# Patient Record
Sex: Female | Born: 1935 | Race: White | Hispanic: No | Marital: Married | Smoking: Never smoker
Health system: Southern US, Community
[De-identification: ages and names within clinical notes are randomized; demographics above are authoritative.]

## PROBLEM LIST (undated history)

## (undated) DIAGNOSIS — C8590 Non-Hodgkin lymphoma, unspecified, unspecified site: Secondary | ICD-10-CM

## (undated) DIAGNOSIS — I10 Essential (primary) hypertension: Secondary | ICD-10-CM

## (undated) DIAGNOSIS — E079 Disorder of thyroid, unspecified: Secondary | ICD-10-CM

## (undated) DIAGNOSIS — I509 Heart failure, unspecified: Secondary | ICD-10-CM

## (undated) DIAGNOSIS — C649 Malignant neoplasm of unspecified kidney, except renal pelvis: Secondary | ICD-10-CM

## (undated) DIAGNOSIS — N289 Disorder of kidney and ureter, unspecified: Secondary | ICD-10-CM

## (undated) DIAGNOSIS — M8580 Other specified disorders of bone density and structure, unspecified site: Secondary | ICD-10-CM

## (undated) HISTORY — PX: OTHER SURGICAL HISTORY: SHX169

## (undated) HISTORY — PX: ABDOMINAL HYSTERECTOMY: SHX81

## (undated) HISTORY — DX: Essential (primary) hypertension: I10

## (undated) HISTORY — DX: Disorder of thyroid, unspecified: E07.9

## (undated) HISTORY — PX: TONSILLECTOMY: SUR1361

## (undated) HISTORY — DX: Other specified disorders of bone density and structure, unspecified site: M85.80

---

## 1999-10-15 ENCOUNTER — Encounter: Admission: RE | Admit: 1999-10-15 | Discharge: 1999-10-15 | Payer: Self-pay | Admitting: *Deleted

## 1999-10-15 ENCOUNTER — Encounter: Payer: Self-pay | Admitting: Obstetrics and Gynecology

## 2000-04-18 ENCOUNTER — Inpatient Hospital Stay (HOSPITAL_COMMUNITY): Admission: EM | Admit: 2000-04-18 | Discharge: 2000-04-19 | Payer: Self-pay | Admitting: Emergency Medicine

## 2000-04-18 ENCOUNTER — Encounter (INDEPENDENT_AMBULATORY_CARE_PROVIDER_SITE_OTHER): Payer: Self-pay

## 2000-10-17 ENCOUNTER — Encounter: Payer: Self-pay | Admitting: Obstetrics and Gynecology

## 2000-10-17 ENCOUNTER — Encounter: Admission: RE | Admit: 2000-10-17 | Discharge: 2000-10-17 | Payer: Self-pay | Admitting: Obstetrics and Gynecology

## 2000-12-20 ENCOUNTER — Ambulatory Visit (HOSPITAL_COMMUNITY): Admission: RE | Admit: 2000-12-20 | Discharge: 2000-12-20 | Payer: Self-pay | Admitting: Oral Surgery

## 2001-01-12 ENCOUNTER — Encounter: Payer: Self-pay | Admitting: Family Medicine

## 2001-01-12 ENCOUNTER — Encounter: Admission: RE | Admit: 2001-01-12 | Discharge: 2001-01-12 | Payer: Self-pay | Admitting: Family Medicine

## 2001-04-24 ENCOUNTER — Ambulatory Visit (HOSPITAL_COMMUNITY): Admission: RE | Admit: 2001-04-24 | Discharge: 2001-04-24 | Payer: Self-pay | Admitting: Surgery

## 2001-04-24 ENCOUNTER — Encounter (INDEPENDENT_AMBULATORY_CARE_PROVIDER_SITE_OTHER): Payer: Self-pay | Admitting: Specialist

## 2001-10-26 ENCOUNTER — Encounter: Payer: Self-pay | Admitting: Family Medicine

## 2001-10-26 ENCOUNTER — Encounter: Admission: RE | Admit: 2001-10-26 | Discharge: 2001-10-26 | Payer: Self-pay | Admitting: Family Medicine

## 2002-10-30 ENCOUNTER — Encounter: Admission: RE | Admit: 2002-10-30 | Discharge: 2002-10-30 | Payer: Self-pay | Admitting: Family Medicine

## 2002-10-30 ENCOUNTER — Encounter: Payer: Self-pay | Admitting: Family Medicine

## 2003-11-18 ENCOUNTER — Encounter: Admission: RE | Admit: 2003-11-18 | Discharge: 2003-11-18 | Payer: Self-pay | Admitting: Family Medicine

## 2004-11-27 ENCOUNTER — Encounter: Admission: RE | Admit: 2004-11-27 | Discharge: 2004-11-27 | Payer: Self-pay | Admitting: Family Medicine

## 2005-02-19 ENCOUNTER — Ambulatory Visit: Payer: Self-pay | Admitting: Family Medicine

## 2005-03-16 ENCOUNTER — Encounter: Admission: RE | Admit: 2005-03-16 | Discharge: 2005-03-16 | Payer: Self-pay | Admitting: Family Medicine

## 2005-03-29 ENCOUNTER — Ambulatory Visit: Payer: Self-pay | Admitting: Family Medicine

## 2005-11-29 ENCOUNTER — Encounter: Admission: RE | Admit: 2005-11-29 | Discharge: 2005-11-29 | Payer: Self-pay | Admitting: Family Medicine

## 2006-03-07 ENCOUNTER — Ambulatory Visit: Payer: Self-pay | Admitting: Family Medicine

## 2006-03-21 ENCOUNTER — Ambulatory Visit: Payer: Self-pay | Admitting: Family Medicine

## 2006-03-22 ENCOUNTER — Ambulatory Visit: Payer: Self-pay | Admitting: Family Medicine

## 2006-04-25 ENCOUNTER — Ambulatory Visit: Payer: Self-pay | Admitting: Family Medicine

## 2006-05-16 ENCOUNTER — Ambulatory Visit: Payer: Self-pay | Admitting: Family Medicine

## 2006-09-19 ENCOUNTER — Ambulatory Visit: Payer: Self-pay | Admitting: Family Medicine

## 2006-10-13 ENCOUNTER — Ambulatory Visit: Payer: Self-pay | Admitting: Family Medicine

## 2006-10-13 LAB — CONVERTED CEMR LAB
Basophils Absolute: 0.2 10*3/uL — ABNORMAL HIGH (ref 0.0–0.1)
Basophils Relative: 4.3 % — ABNORMAL HIGH (ref 0.0–1.0)
Hemoglobin: 13.2 g/dL (ref 12.0–15.0)
Lymphocytes Relative: 34.9 % (ref 12.0–46.0)
MCHC: 33.9 g/dL (ref 30.0–36.0)
Monocytes Absolute: 0.6 10*3/uL (ref 0.2–0.7)
Monocytes Relative: 12 % — ABNORMAL HIGH (ref 3.0–11.0)
Neutro Abs: 2 10*3/uL (ref 1.4–7.7)
Platelets: 180 10*3/uL (ref 150–400)
RBC: 4.21 M/uL (ref 3.87–5.11)
Saturation Ratios: 30.6 % (ref 20.0–50.0)
WBC: 4.6 10*3/uL (ref 4.5–10.5)

## 2006-12-12 ENCOUNTER — Encounter: Admission: RE | Admit: 2006-12-12 | Discharge: 2006-12-12 | Payer: Self-pay | Admitting: Family Medicine

## 2006-12-26 DIAGNOSIS — M949 Disorder of cartilage, unspecified: Secondary | ICD-10-CM

## 2006-12-26 DIAGNOSIS — E039 Hypothyroidism, unspecified: Secondary | ICD-10-CM

## 2006-12-26 DIAGNOSIS — C8589 Other specified types of non-Hodgkin lymphoma, extranodal and solid organ sites: Secondary | ICD-10-CM

## 2006-12-26 DIAGNOSIS — M899 Disorder of bone, unspecified: Secondary | ICD-10-CM | POA: Insufficient documentation

## 2007-04-11 ENCOUNTER — Ambulatory Visit: Payer: Self-pay | Admitting: Family Medicine

## 2007-04-11 LAB — CONVERTED CEMR LAB
AST: 25 units/L (ref 0–37)
Basophils Absolute: 0 10*3/uL (ref 0.0–0.1)
Basophils Relative: 1.2 % — ABNORMAL HIGH (ref 0.0–1.0)
Bilirubin, Direct: 0.1 mg/dL (ref 0.0–0.3)
Calcium: 9.5 mg/dL (ref 8.4–10.5)
HCT: 34.7 % — ABNORMAL LOW (ref 36.0–46.0)
HDL: 67.5 mg/dL (ref >39.0)
Hemoglobin: 11.9 g/dL — ABNORMAL LOW (ref 12.0–15.0)
Lymphocytes Relative: 32.5 % (ref 12.0–46.0)
MCHC: 34.3 g/dL (ref 30.0–36.0)
MCV: 83.9 fL (ref 78.0–100.0)
Neutro Abs: 2 10*3/uL (ref 1.4–7.7)
Neutrophils Relative %: 47.6 % (ref 43.0–77.0)
RDW: 13.7 % (ref 11.5–14.6)
Total Bilirubin: 0.7 mg/dL (ref 0.3–1.2)
Total Protein: 7 g/dL (ref 6.0–8.3)
WBC: 4 10*3/uL — ABNORMAL LOW (ref 4.5–10.5)

## 2007-04-20 ENCOUNTER — Ambulatory Visit: Payer: Self-pay | Admitting: Family Medicine

## 2007-12-14 ENCOUNTER — Encounter: Admission: RE | Admit: 2007-12-14 | Discharge: 2007-12-14 | Payer: Self-pay | Admitting: Family Medicine

## 2007-12-18 ENCOUNTER — Encounter (INDEPENDENT_AMBULATORY_CARE_PROVIDER_SITE_OTHER): Payer: Self-pay | Admitting: *Deleted

## 2008-05-15 ENCOUNTER — Ambulatory Visit: Payer: Self-pay | Admitting: Internal Medicine

## 2008-05-15 ENCOUNTER — Encounter: Payer: Self-pay | Admitting: Family Medicine

## 2008-06-07 ENCOUNTER — Telehealth (INDEPENDENT_AMBULATORY_CARE_PROVIDER_SITE_OTHER): Payer: Self-pay | Admitting: *Deleted

## 2008-06-10 ENCOUNTER — Telehealth (INDEPENDENT_AMBULATORY_CARE_PROVIDER_SITE_OTHER): Payer: Self-pay | Admitting: *Deleted

## 2008-06-13 ENCOUNTER — Telehealth (INDEPENDENT_AMBULATORY_CARE_PROVIDER_SITE_OTHER): Payer: Self-pay | Admitting: *Deleted

## 2008-07-15 ENCOUNTER — Telehealth (INDEPENDENT_AMBULATORY_CARE_PROVIDER_SITE_OTHER): Payer: Self-pay | Admitting: *Deleted

## 2008-08-16 ENCOUNTER — Telehealth (INDEPENDENT_AMBULATORY_CARE_PROVIDER_SITE_OTHER): Payer: Self-pay | Admitting: *Deleted

## 2008-08-19 ENCOUNTER — Encounter: Payer: Self-pay | Admitting: Family Medicine

## 2008-08-19 ENCOUNTER — Ambulatory Visit: Payer: Self-pay | Admitting: Family Medicine

## 2008-08-19 DIAGNOSIS — I10 Essential (primary) hypertension: Secondary | ICD-10-CM

## 2008-08-19 LAB — CONVERTED CEMR LAB
Bilirubin Urine: NEGATIVE
Blood in Urine, dipstick: NEGATIVE
Glucose, Urine, Semiquant: NEGATIVE
Ketones, urine, test strip: NEGATIVE
Nitrite: NEGATIVE
Protein, U semiquant: NEGATIVE
Specific Gravity, Urine: 1.02
Urobilinogen, UA: NEGATIVE
WBC Urine, dipstick: NEGATIVE
pH: 5

## 2008-08-20 ENCOUNTER — Telehealth (INDEPENDENT_AMBULATORY_CARE_PROVIDER_SITE_OTHER): Payer: Self-pay | Admitting: *Deleted

## 2008-08-20 LAB — CONVERTED CEMR LAB
ALT: 16 units/L (ref 0–35)
Alkaline Phosphatase: 51 units/L (ref 39–117)
BUN: 13 mg/dL (ref 6–23)
Basophils Absolute: 0 10*3/uL (ref 0.0–0.1)
Bilirubin, Direct: 0.1 mg/dL (ref 0.0–0.3)
Calcium: 9.1 mg/dL (ref 8.4–10.5)
Chloride: 104 meq/L (ref 96–112)
Cholesterol: 193 mg/dL (ref 0–200)
Eosinophils Absolute: 0.1 10*3/uL (ref 0.0–0.7)
GFR calc non Af Amer: 65 mL/min
Glucose, Bld: 90 mg/dL (ref 70–99)
HDL: 63.9 mg/dL (ref >39.0)
MCHC: 34.4 g/dL (ref 30.0–36.0)
Monocytes Absolute: 0.5 10*3/uL (ref 0.1–1.0)
Monocytes Relative: 10.4 % (ref 3.0–12.0)
Neutrophils Relative %: 54.9 % (ref 43.0–77.0)
Potassium: 4.1 meq/L (ref 3.5–5.1)
Sodium: 142 meq/L (ref 135–145)
Total CHOL/HDL Ratio: 3
Triglycerides: 106 mg/dL (ref 0–149)
VLDL: 21 mg/dL (ref 0–40)
Vit D, 1,25-Dihydroxy: 55 (ref 30–89)

## 2008-08-29 ENCOUNTER — Ambulatory Visit: Payer: Self-pay | Admitting: Family Medicine

## 2008-08-29 ENCOUNTER — Encounter (INDEPENDENT_AMBULATORY_CARE_PROVIDER_SITE_OTHER): Payer: Self-pay | Admitting: *Deleted

## 2008-08-29 LAB — CONVERTED CEMR LAB
OCCULT 2: NEGATIVE
OCCULT 3: NEGATIVE

## 2008-09-18 ENCOUNTER — Ambulatory Visit: Payer: Self-pay | Admitting: Family Medicine

## 2009-01-07 ENCOUNTER — Encounter: Admission: RE | Admit: 2009-01-07 | Discharge: 2009-01-07 | Payer: Self-pay | Admitting: Family Medicine

## 2009-01-27 ENCOUNTER — Encounter (INDEPENDENT_AMBULATORY_CARE_PROVIDER_SITE_OTHER): Payer: Self-pay | Admitting: *Deleted

## 2009-02-11 ENCOUNTER — Encounter: Payer: Self-pay | Admitting: Family Medicine

## 2009-03-19 ENCOUNTER — Ambulatory Visit: Payer: Self-pay | Admitting: Family Medicine

## 2009-05-14 ENCOUNTER — Telehealth (INDEPENDENT_AMBULATORY_CARE_PROVIDER_SITE_OTHER): Payer: Self-pay | Admitting: *Deleted

## 2009-08-21 ENCOUNTER — Ambulatory Visit: Payer: Self-pay | Admitting: Family Medicine

## 2009-08-26 ENCOUNTER — Encounter (INDEPENDENT_AMBULATORY_CARE_PROVIDER_SITE_OTHER): Payer: Self-pay | Admitting: *Deleted

## 2009-08-26 LAB — CONVERTED CEMR LAB
AST: 23 units/L (ref 0–37)
Albumin: 3.6 g/dL (ref 3.5–5.2)
Basophils Relative: 1.4 % (ref 0.0–3.0)
Bilirubin, Direct: 0 mg/dL (ref 0.0–0.3)
CO2: 31 meq/L (ref 19–32)
Calcium: 9.4 mg/dL (ref 8.4–10.5)
Creatinine, Ser: 0.9 mg/dL (ref 0.4–1.2)
Eosinophils Absolute: 0.2 10*3/uL (ref 0.0–0.7)
GFR calc non Af Amer: 64.88 mL/min (ref >60)
Glucose, Bld: 80 mg/dL (ref 70–99)
HCT: 37.1 % (ref 36.0–46.0)
Hemoglobin: 12.4 g/dL (ref 12.0–15.0)
LDL Cholesterol: 117 mg/dL — ABNORMAL HIGH (ref 0–99)
MCV: 95.6 fL (ref 78.0–100.0)
Monocytes Absolute: 0.6 10*3/uL (ref 0.1–1.0)
Monocytes Relative: 13.1 % — ABNORMAL HIGH (ref 3.0–12.0)
RBC: 3.88 M/uL (ref 3.87–5.11)
Sodium: 137 meq/L (ref 135–145)
Total Bilirubin: 0.6 mg/dL (ref 0.3–1.2)
Total CHOL/HDL Ratio: 3
VLDL: 16.8 mg/dL (ref 0.0–40.0)

## 2009-08-27 ENCOUNTER — Ambulatory Visit: Payer: Self-pay | Admitting: Family Medicine

## 2009-10-16 ENCOUNTER — Encounter: Payer: Self-pay | Admitting: Family Medicine

## 2010-01-12 ENCOUNTER — Encounter: Admission: RE | Admit: 2010-01-12 | Discharge: 2010-01-12 | Payer: Self-pay | Admitting: Family Medicine

## 2010-01-14 ENCOUNTER — Encounter: Payer: Self-pay | Admitting: Family Medicine

## 2010-01-15 ENCOUNTER — Encounter: Admission: RE | Admit: 2010-01-15 | Discharge: 2010-01-15 | Payer: Self-pay | Admitting: Family Medicine

## 2010-07-13 ENCOUNTER — Telehealth (INDEPENDENT_AMBULATORY_CARE_PROVIDER_SITE_OTHER): Payer: Self-pay | Admitting: *Deleted

## 2010-08-24 ENCOUNTER — Ambulatory Visit: Payer: Self-pay | Admitting: Family Medicine

## 2010-08-24 ENCOUNTER — Ambulatory Visit: Payer: Self-pay | Admitting: Internal Medicine

## 2010-08-24 ENCOUNTER — Encounter: Payer: Self-pay | Admitting: Family Medicine

## 2010-08-24 LAB — CONVERTED CEMR LAB
Bilirubin Urine: NEGATIVE
Blood in Urine, dipstick: NEGATIVE
Glucose, Urine, Semiquant: NEGATIVE
WBC Urine, dipstick: NEGATIVE

## 2010-08-26 LAB — CONVERTED CEMR LAB
Alkaline Phosphatase: 52 units/L (ref 39–117)
Bilirubin, Direct: 0.2 mg/dL (ref 0.0–0.3)
Calcium: 9.7 mg/dL (ref 8.4–10.5)
Cholesterol: 207 mg/dL — ABNORMAL HIGH (ref 0–200)
Creatinine, Ser: 0.9 mg/dL (ref 0.4–1.2)
Direct LDL: 115.8 mg/dL
Eosinophils Relative: 2.9 % (ref 0.0–5.0)
Glucose, Bld: 87 mg/dL (ref 70–99)
HCT: 34.3 % — ABNORMAL LOW (ref 36.0–46.0)
HDL: 59.5 mg/dL (ref >39.00)
Hemoglobin: 11.6 g/dL — ABNORMAL LOW (ref 12.0–15.0)
Lymphs Abs: 1.4 10*3/uL (ref 0.7–4.0)
MCHC: 33.9 g/dL (ref 30.0–36.0)
MCV: 96.5 fL (ref 78.0–100.0)
Platelets: 177 10*3/uL (ref 150.0–400.0)
RBC: 3.55 M/uL — ABNORMAL LOW (ref 3.87–5.11)
Sodium: 141 meq/L (ref 135–145)
Total Bilirubin: 0.4 mg/dL (ref 0.3–1.2)
Total CHOL/HDL Ratio: 3
Triglycerides: 142 mg/dL (ref 0.0–149.0)

## 2010-09-01 ENCOUNTER — Encounter: Payer: Self-pay | Admitting: Family Medicine

## 2010-09-02 ENCOUNTER — Ambulatory Visit: Payer: Self-pay | Admitting: Family Medicine

## 2010-09-02 LAB — CONVERTED CEMR LAB: Fecal Occult Bld: NEGATIVE

## 2010-09-15 ENCOUNTER — Telehealth (INDEPENDENT_AMBULATORY_CARE_PROVIDER_SITE_OTHER): Payer: Self-pay | Admitting: *Deleted

## 2010-12-06 ENCOUNTER — Encounter: Payer: Self-pay | Admitting: Family Medicine

## 2010-12-07 ENCOUNTER — Other Ambulatory Visit: Payer: Self-pay | Admitting: Family Medicine

## 2010-12-07 DIAGNOSIS — Z1239 Encounter for other screening for malignant neoplasm of breast: Secondary | ICD-10-CM

## 2010-12-15 NOTE — Medication Information (Signed)
Summary: Letter Regarding Premarin/[facility]  Letter Regarding Premarin/[facility]   Imported By: Lanelle Bal 11/27/2009 08:34:28  _____________________________________________________________________  External Attachment:    Type:   Image     Comment:   External Document  Appended Document: Letter Regarding Premarin/[facility] Let pt know---Ins co sent letter advising she come off of premarin.  Has she tried to come off recently?  If symptoms are too bad we will con't ----but risk of CV problems increased with more than  5 years of use.    Appended Document: Letter Regarding Premarin/[facility] Pt states that Dr.McPhail (who originally started her on the medication) told her she should always continue. She says she feels the same but does not feel comfortable coming off the medicaiton.   Appended Document: Letter Regarding Premarin/[facility] We all used to feel the same, but the thinking has changed in the last few years.   She is on a low dose.  As long as she understands the risk --- she can con't .    Appended Document: Letter Regarding Premarin/[facility] Pt is going to try to come off- does she need to taper off? She is going to finish the medication that she has now. Then try.   Appended Document: Letter Regarding Premarin/[facility] she is on the lowest dose---she can just stop.    Appended Document: Letter Regarding Premarin/[facility] pt is aware.

## 2010-12-15 NOTE — Miscellaneous (Signed)
Summary: BONE DENSITY  Clinical Lists Changes  Orders: Added new Test order of T-Bone Densitometry (77080) - Signed Added new Test order of T-Lumbar Vertebral Assessment (77082) - Signed 

## 2010-12-15 NOTE — Assessment & Plan Note (Signed)
Summary: CPX/FASTING//KN   Vital Signs:  Patient profile:   77 year old female Menstrual status:  hysterectomy Height:      62.5 inches Weight:      153.2 pounds BMI:     27.67 Temp:     98.5 degrees F oral Pulse rate:   72 / minute Pulse rhythm:   regular BP sitting:   116 / 72  (right arm) Cuff size:   regular  Vitals Entered By: Almeta Monas CMA Duncan Dull) (August 24, 2010 8:51 AM) CC: cpx/fasting, flu shot given  Does patient need assistance? Functional Status Self care, Cook/clean, Shopping, Social activities Ambulation Normal Comments pt able to do all adls and can read and write.  Vision Screening:      Vision Comments: optho--- q1y corrected with glasses  40db HL: Left  Right  Audiometry Comment: + L hearing aid      Menstrual Status hysterectomy   History of Present Illness: Pt here for cpe.  No complaints.    Preventive Screening-Counseling & Management  Alcohol-Tobacco     Alcohol drinks/day: 0     Smoking Status: never  Caffeine-Diet-Exercise     Caffeine use/day: <1     Diet Comments: needs to work harder on salt restriction     Does Patient Exercise: yes     Type of exercise: walking     Exercise (avg: min/session): 30-60     Times/week: 5  Hep-HIV-STD-Contraception     Hepatitis Risk: no risk noted     HIV Risk: no risk noted     STD Risk: no risk noted     Dental Visit-last 6 months yes     Dental Care Counseling: not indicated; dental care within six months     SBE monthly: yes     SBE Education/Counseling: not indicated; SBE done regularly  Safety-Violence-Falls     Seat Belt Use: yes     Firearms in the Home: firearms in the home     Firearm Counseling: not indicated; uses recommended firearm safety measures     Smoke Detectors: yes     Violence in the Home: no risk noted     Violence Counseling: not indicated; no violence risk noted     Sexual Abuse: no     Sexual Abuse Counseling: no     Fall Risk: no      Sexual History:   currently monogamous.    Current Medications (verified): 1)  Fosamax 70 Mg  Tabs (Alendronate Sodium) .Marland Kitchen.. 1 By Mouth Weekly 2)  Synthroid 25 Mcg Tabs (Levothyroxine Sodium) .Marland Kitchen.. 1 By Mouth Once Daily 3)  Premarin 0.3 Mg Tabs (Estrogens Conjugated) .Marland Kitchen.. 1 By Mouth Once Daily 4)  Lisinopril-Hydrochlorothiazide 10-12.5 Mg Tabs (Lisinopril-Hydrochlorothiazide) .Marland Kitchen.. 1 By Mouth Once Daily 5)  Vitamin B-12 1000 Mcg Tabs (Cyanocobalamin) .Marland Kitchen.. 1 By Mouth Daily. 6)  Vitamin D3 1000 Unit Tabs (Cholecalciferol) .Marland Kitchen.. 1 By Mouth Daily. 7)  Womens Multivitamin Plus  Tabs (Multiple Vitamins-Minerals) .Marland Kitchen.. 1 By Mouth Daily. 8)  Flaxseed Oil 1000 Mg Caps (Flaxseed (Linseed)) .Marland Kitchen.. 1 By Mouth Daily. 9)  Zostavax 16109 Unt/0.73ml Solr (Zoster Vaccine Live) .Marland Kitchen.. 1 Ml Im X1 10)  Calcium-D 600-200 Mg-Unit Caps (Calcium Carbonate-Vitamin D) .... 2 By Mouth Once Daily 11)  Iron Supplement 325 (65 Fe) Mg Tabs (Ferrous Sulfate) .Marland Kitchen.. 1 By Mouth Once Daily 12)  Zostavax 60454 Unt/0.18ml Solr (Zoster Vaccine Live) .Marland Kitchen.. 1 Ml Im X1  Allergies (verified): No Known Drug Allergies  Past History:  Past Medical History: Last updated: 08/19/2008 Hypothyroidism Osteopenia Hypertension  Past Surgical History: Last updated: 12/26/2006 Hysterectomy (11/99) Tonsillectomy (11/99)  Family History: Last updated: 08/19/2008 dementia  COPD fractured hip  Social History: Last updated: 08/19/2008 Retired Married Former Smoker Alcohol use-no Drug use-no Regular exercise-yes  Risk Factors: Alcohol Use: 0 (08/24/2010) Caffeine Use: <1 (08/24/2010) Diet: needs to work harder on salt restriction (08/24/2010) Exercise: yes (08/24/2010)  Risk Factors: Smoking Status: never (08/24/2010)  Family History: Reviewed history from 08/19/2008 and no changes required. dementia  COPD fractured hip  Social History: Reviewed history from 08/19/2008 and no changes required. 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 Eyes:  Denies blurring, discharge, double vision, eye irritation, eye pain, halos, itching, light sensitivity, red eye, vision loss-1 eye, and vision loss-both eyes. ENT:  Complains of decreased hearing; denies difficulty swallowing, ear discharge, earache, hoarseness, nasal congestion, nosebleeds, postnasal drainage, ringing in ears, sinus pressure, and sore throat. CV:  Denies bluish discoloration of lips or nails, chest pain or discomfort, difficulty breathing at night, difficulty breathing while lying down, fainting, fatigue, leg cramps with exertion, lightheadness, near fainting, palpitations, shortness of breath with exertion, swelling of feet, swelling of hands, and weight gain. Resp:  Denies chest discomfort, chest pain with inspiration, cough, coughing up blood, excessive snoring, hypersomnolence, morning headaches, pleuritic, shortness of breath, sputum productive, and wheezing. GI:  Denies abdominal pain, bloody stools, change in bowel habits, constipation, dark tarry stools, diarrhea, excessive appetite, gas, hemorrhoids, indigestion, loss of appetite, nausea, vomiting, vomiting blood, and yellowish skin color. GU:  Denies abnormal vaginal bleeding, decreased libido, discharge, dysuria, genital sores, hematuria, incontinence, nocturia, urinary frequency, and urinary hesitancy. MS:  Denies joint pain, joint redness, joint swelling, loss of strength, low back pain, mid back pain, muscle aches, muscle , cramps, muscle weakness, stiffness, and thoracic pain. Derm:  Denies changes in color of skin, changes in nail beds, dryness, excessive perspiration, flushing, hair loss, insect bite(s), itching, lesion(s), poor wound healing, and  rash. Neuro:  Denies brief paralysis, difficulty with concentration, disturbances in coordination, falling down, headaches, inability to speak, memory loss, numbness, poor balance, seizures, sensation of room spinning, tingling, tremors, visual disturbances, and weakness. Psych:  Denies alternate hallucination ( auditory/visual), anxiety, depression, easily angered, easily tearful, irritability, mental problems, panic attacks, sense of great danger, suicidal thoughts/plans, thoughts of violence, unusual visions or sounds, and thoughts /plans of harming others. Endo:  Denies cold intolerance, excessive hunger, excessive thirst, excessive urination, heat intolerance, polyuria, and weight change. Heme:  Denies abnormal bruising, bleeding, enlarge lymph nodes, fevers, pallor, and skin discoloration. Allergy:  Denies hives or rash, itching eyes, persistent infections, seasonal allergies, and sneezing.  Physical Exam  General:  Well-developed,well-nourished,in no acute distress; alert,appropriate and cooperative throughout examination Head:  Normocephalic and atraumatic without obvious abnormalities. No apparent alopecia or balding. Eyes:  pupils equal, pupils round, pupils reactive to light, and no injection.   Ears:  External ear exam shows no significant lesions or deformities.  Otoscopic examination reveals clear canals, tympanic membranes are intact bilaterally without bulging, retraction, inflammation or discharge. Hearing is grossly normal bilaterally. Nose:  External nasal examination shows no deformity or inflammation. Nasal mucosa are pink and moist without lesions or exudates. Mouth:  Oral mucosa and oropharynx without lesions or exudates.  Teeth in good repair. Neck:  No deformities, masses, or tenderness noted. Chest Wall:  No deformities, masses, or tenderness noted. Breasts:  No mass, nodules, thickening, tenderness, bulging, retraction, inflamation, nipple discharge or skin changes noted.    Lungs:  Normal respiratory effort, chest expands symmetrically. Lungs are clear to auscultation, no crackles or wheezes. Heart:  normal rate and no murmur.   Abdomen:  Bowel sounds positive,abdomen soft and non-tender without masses, organomegaly or hernias noted. Msk:  normal ROM, no joint tenderness, no joint swelling, and no joint warmth.   Pulses:  R and L carotid,radial,femoral,dorsalis pedis and posterior tibial pulses are full and equal bilaterally Extremities:  No clubbing, cyanosis, edema, or deformity noted with normal full range of motion of all joints.   Neurologic:  No cranial nerve deficits noted. Station and gait are normal. Plantar reflexes are down-going bilaterally. DTRs are symmetrical throughout. Sensory, motor and coordinative functions appear intact. Skin:  Intact without suspicious lesions or rashes Cervical Nodes:  No lymphadenopathy noted Axillary Nodes:  No palpable lymphadenopathy Psych:  Cognition and judgment appear intact. Alert and cooperative with normal attention span and concentration. No apparent delusions, illusions, hallucinations   Impression & Recommendations:  Problem # 1:  PREVENTIVE HEALTH CARE (ICD-V70.0)  Orders: Venipuncture (24401) TLB-Lipid Panel (80061-LIPID) TLB-BMP (Basic Metabolic Panel-BMET) (80048-METABOL) TLB-CBC Platelet - w/Differential (85025-CBCD) TLB-Hepatic/Liver Function Pnl (80076-HEPATIC) TLB-TSH (Thyroid Stimulating Hormone) (84443-TSH) T-Vitamin D (25-Hydroxy) (02725-36644) UA Dipstick w/o Micro (manual) (03474) Welcome to Medicare, Physical (Q5956) EKG w/ Interpretation (93000)  Problem # 2:  HYPERTENSION (ICD-401.9)  Her updated medication list for this problem includes:    Lisinopril-hydrochlorothiazide 10-12.5 Mg Tabs (Lisinopril-hydrochlorothiazide) .Marland Kitchen... 1 by mouth once daily  Orders: Venipuncture (38756) TLB-Lipid Panel (80061-LIPID) TLB-BMP (Basic Metabolic Panel-BMET) (80048-METABOL) TLB-CBC Platelet -  w/Differential (85025-CBCD) TLB-Hepatic/Liver Function Pnl (80076-HEPATIC) TLB-TSH (Thyroid Stimulating Hormone) (84443-TSH) T-Vitamin D (25-Hydroxy) (43329-51884)  Problem # 3:  LYMPHOMA (ICD-202.80)  Problem # 4:  OSTEOPENIA (ICD-733.90)  Her updated medication list for this problem includes:    Fosamax 70 Mg Tabs (Alendronate sodium) .Marland Kitchen... 1 by mouth weekly    Vitamin D3 1000 Unit Tabs (Cholecalciferol) .Marland Kitchen... 1 by mouth daily.    Calcium-d 600-200 Mg-unit Caps (Calcium carbonate-vitamin d) .Marland Kitchen... 2 by mouth once daily  Orders: Venipuncture (16606) TLB-Lipid Panel (80061-LIPID) TLB-BMP (Basic Metabolic Panel-BMET) (80048-METABOL) TLB-CBC Platelet - w/Differential (85025-CBCD) TLB-Hepatic/Liver Function Pnl (80076-HEPATIC) TLB-TSH (Thyroid Stimulating Hormone) (84443-TSH) T-Vitamin D (25-Hydroxy) (30160-10932)  Bone Density: abnormal (06/10/2008) Vit D:55 (08/19/2008)  Problem # 5:  HYPOTHYROIDISM (ICD-244.9)  Her updated medication list for this problem includes:    Synthroid 25 Mcg Tabs (Levothyroxine sodium) .Marland Kitchen... 1 by mouth once daily  Orders: Venipuncture (35573) TLB-Lipid Panel (80061-LIPID) TLB-BMP (Basic Metabolic Panel-BMET) (80048-METABOL) TLB-CBC Platelet - w/Differential (85025-CBCD) TLB-Hepatic/Liver Function Pnl (80076-HEPATIC) TLB-TSH (Thyroid Stimulating Hormone) (84443-TSH) T-Vitamin D (25-Hydroxy) (22025-42706)  Labs Reviewed: TSH: 2.30 (08/21/2009)    Chol: 195 (08/21/2009)   HDL: 61.10 (08/21/2009)   LDL: 117 (08/21/2009)   TG: 84.0 (08/21/2009)  Complete Medication List: 1)  Fosamax 70 Mg Tabs (Alendronate sodium) .Marland Kitchen.. 1 by mouth weekly 2)  Synthroid 25 Mcg Tabs (Levothyroxine sodium) .Marland Kitchen.. 1 by mouth once daily 3)  Premarin 0.3 Mg Tabs (Estrogens conjugated) .Marland Kitchen.. 1 by mouth once daily 4)  Lisinopril-hydrochlorothiazide 10-12.5 Mg Tabs (Lisinopril-hydrochlorothiazide) .Marland Kitchen.. 1 by mouth once daily 5)  Vitamin B-12 1000 Mcg Tabs (Cyanocobalamin) .Marland Kitchen.. 1  by mouth daily. 6)  Vitamin D3 1000  Unit Tabs (Cholecalciferol) .Marland Kitchen.. 1 by mouth daily. 7)  Womens Multivitamin Plus Tabs (Multiple vitamins-minerals) .Marland Kitchen.. 1 by mouth daily. 8)  Flaxseed Oil 1000 Mg Caps (Flaxseed (linseed)) .Marland Kitchen.. 1 by mouth daily. 9)  Zostavax 16109 Unt/0.64ml Solr (Zoster vaccine live) .Marland Kitchen.. 1 ml im x1 10)  Calcium-d 600-200 Mg-unit Caps (Calcium carbonate-vitamin d) .... 2 by mouth once daily 11)  Iron Supplement 325 (65 Fe) Mg Tabs (Ferrous sulfate) .Marland Kitchen.. 1 by mouth once daily 12)  Zostavax 60454 Unt/0.52ml Solr (Zoster vaccine live) .Marland Kitchen.. 1 ml im x1  Other Orders: Flu Vaccine 72yrs + MEDICARE PATIENTS (U9811) Administration Flu vaccine - MCR (B1478) Flu Vaccine Consent Questions     Do you have a history of severe allergic reactions to this vaccine? no    Any prior history of allergic reactions to egg and/or gelatin? no    Do you have a sensitivity to the preservative Thimersol? no    Do you have a past history of Guillan-Barre Syndrome? no    Do you currently have an acute febrile illness? no    Have you ever had a severe reaction to latex? no    Vaccine information given and explained to patient? yes    Are you currently pregnant? no    Lot Number:[id number]   Exp Date:05/15/2011   Site Given  Left Deltoid IM Flu Vaccine 72yrs + MEDICARE PATIENTS (G9562) Administration Flu vaccine - MCR (Z3086)  Patient Instructions: 1)  Please schedule a follow-up appointment in 6 months .  Prescriptions: PREMARIN 0.3 MG TABS (ESTROGENS CONJUGATED) 1 by mouth once daily  #90 x 3   Entered and Authorized by:   Loreen Freud DO   Signed by:   Loreen Freud DO on 08/24/2010   Method used:   Electronically to        [facility]* (retail)       [address] [place], [place]  [address]       Ph: [phone] or [phone]       Fax: [phone]   RxID:   [id number] SYNTHROID 25 MCG TABS (LEVOTHYROXINE SODIUM) 1 by  mouth once daily  #90 x 3   Entered and Authorized by:   Loreen Freud DO   Signed by:   Loreen Freud DO on 08/24/2010   Method used:   Electronically to        [facility]* (retail)       [address] [place], [place]  [address]       Ph: [phone] or [phone]       Fax: [phone]   RxID:   [id number] FOSAMAX 70 MG  TABS (ALENDRONATE SODIUM) 1 by mouth weekly  #12 x 3   Entered and Authorized by:   Loreen Freud DO   Signed by:   Loreen Freud DO on 08/24/2010   Method used:   Electronically to        [facility]* (retail)       [address] [place], [place]  [address]       Ph: [phone] or [phone]       Fax: [phone]   RxID:  [id number] ZOSTAVAX 14782 UNT/0.65ML SOLR (ZOSTER VACCINE LIVE) 1 ml Im x1  #1 x 0   Entered and Authorized by:   Loreen Freud DO   Signed by:   Loreen Freud DO on 08/24/2010   Method used:   Print then Give to Patient   RxID:   [id number]  .lbmedflu  PAP Next Due:  Refused    Laboratory Results   Urine Tests   Date/Time Reported: August 24, 2010 11:10 AM  Routine Urinalysis   Color: yellow Appearance: Clear Glucose: negative   (Normal Range: Negative) Bilirubin: negative   (Normal Range: Negative) Ketone: negative   (Normal Range: Negative) Spec. Gravity: <1.005   (Normal Range: 1.003-1.035) Blood: negative   (Normal Range: Negative) pH: 7.5   (Normal Range: 5.0-8.0) Protein: negative   (Normal Range: Negative) Urobilinogen: 0.2   (Normal Range: 0-1) Nitrite: negative   (Normal Range: Negative) Leukocyte Esterace: negative   (Normal Range: Negative)

## 2010-12-15 NOTE — Progress Notes (Signed)
Summary: fosamax refill   Phone Note Refill Request Call back at [phone] Message from:  Pharmacy on July 13, 2010 10:23 AM  Refills Requested: Medication #1:  FOSAMAX 70 MG  TABS 1 by mouth weekly   Dosage confirmed as above?Dosage Confirmed   Supply Requested: 1 month   Last Refilled: 04/15/2010 [facility]  Next Appointment Scheduled: 08/24/10 Initial call taken by: Lavell Islam,  July 13, 2010 10:23 AM    Prescriptions: FOSAMAX 70 MG  TABS (ALENDRONATE SODIUM) 1 by mouth weekly  #12 x 1   Entered by:   Doristine Devoid CMA   Authorized by:   Loreen Freud DO   Signed by:   Doristine Devoid CMA on 07/13/2010   Method used:   Electronically to        [facility]* (retail)       [address] [place], [place]  [address]       Ph: [phone] or [phone]       Fax: [phone]   RxID:   [id number]

## 2010-12-15 NOTE — Progress Notes (Signed)
Summary: refill  Phone Note Refill Request Message from:  Fax from Pharmacy on September 15, 2010 11:25 AM  Refills Requested: Medication #1:  LISINOPRIL-HYDROCHLOROTHIAZIDE 10-12.5 MG TABS 1 by mouth once daily [facility] - fax [phone]  Initial call taken by: Okey Regal [place],  September 15, 2010 11:25 AM    Prescriptions: LISINOPRIL-HYDROCHLOROTHIAZIDE 10-12.5 MG TABS (LISINOPRIL-HYDROCHLOROTHIAZIDE) 1 by mouth once daily  #90 x 3   Entered by:   Almeta Monas CMA (AAMA)   Authorized by:   Loreen Freud DO   Signed by:   Almeta Monas CMA (AAMA) on 09/15/2010   Method used:   Electronically to        [facility]* (retail)       [address] [place], [place]  [address]       Ph: [phone] or [phone]       Fax: [phone]   RxID:   [id number]   Appended Document: refill RX refaxed

## 2010-12-15 NOTE — Letter (Signed)
Summary: [facility]: Immunoassay Fecal Occult Blood (iFOB) Order Form  [facility]  [address] [place], [place] [address]   Phone: [phone]  Fax: [phone]      [facility]: Immunoassay Fecal Occult Blood (iFOB) Order Form   September 01, 2010 MRN: [id number]   [facility] 02/17/1932   Physicican Name:____Dr.Lowne_____________________  Diagnosis Code:_____V56.71_____________      Sharon [name] CMA (AAMA)

## 2010-12-18 ENCOUNTER — Encounter: Payer: Self-pay | Admitting: Family Medicine

## 2010-12-31 NOTE — Procedures (Signed)
Summary: Colonoscopy Report/[facility]  Colonoscopy Report/[facility]   Imported By: Maryln Gottron 12/25/2010 09:36:58  _____________________________________________________________________  External Attachment:    Type:   Image     Comment:   External Document

## 2011-01-18 ENCOUNTER — Ambulatory Visit
Admission: RE | Admit: 2011-01-18 | Discharge: 2011-01-18 | Disposition: A | Discharge status: Home / Self Care | Payer: Medicare Other | Source: Ambulatory Visit | Attending: Family Medicine | Admitting: Family Medicine

## 2011-01-18 DIAGNOSIS — Z1239 Encounter for other screening for malignant neoplasm of breast: Secondary | ICD-10-CM

## 2011-04-02 NOTE — Procedures (Signed)
[facility]  Patient:    Sharon [name], Sharon [name]                       MRN: [id number] Proc. Date: 04/19/00 Adm. Date:  [id number] Disc. Date: [id number] Attending:  Nelda Marseille CC:         Petra Kuba, M.D.             Katherine Roan, M.D.             Devra Dopp                           Procedure Report  PROCEDURE:  Colonoscopy.  INDICATION:  Bright red blood per rectum, microcytic anemia.  Consent was signed after risks, benefits, and options thoroughly discussed in the office in the past and last night by Dr. Madilyn Fireman and with me prior to this procedure.  MEDICINES USED:  Demerol 60 mg, Versed 6 mg.  DESCRIPTION OF PROCEDURE:  Rectal inspection was pertinent for significant hemorrhoids with tears and some ulceration.  Digital exam is negative.  The video pediatric colonoscope was inserted.  The rectum was thoroughly evaluated on straight and retroflexed visualization.  She also had significant internal hemorrhoids.  The scope was then straightened and easily advanced around the colon to the cecum.  No blood or other abnormalities seen on insertion.  The cecum was identified by the appendiceal orifice and the ileocecal valve.  In fact, the scope was inserted a short ways into the terminal ileum, which was normal.  Photo documentation was obtained.  The scope was slowly withdrawn. Prep was adequate.  There was minimal liquid stool that required washing and suctioning.  On slow withdrawal through the colon, the cecum, ascending, transverse, descending, and sigmoid were normal.  She did have a slightly tortuous sigmoid, but no abnormalities were seen.  Once back in the rectum, retroflexed and straight visualization and anorectal pull-through were all done, which confirmed the significant hemorrhoids.  The scope was straightened and re-advanced a short ways up the sigmoid, air was suctioned, and scope removed.  The patient tolerated the procedure well.   There was no evidence of immediate complication.  ENDOSCOPIC DIAGNOSES: 1. Significant external and internal hemorrhoids. 2. Otherwise within normal limits to the terminal ileum without any heme being    seen.  PLAN:  Significant hemorrhoidal therapy with both suppositories and creams. Begin iron and continue at endoscopy to be sure no missed lesion, and consideration if above does not work, sending her to a Careers adviser. DD:  04/19/00 TD:  04/22/00 Job: [id number]

## 2011-04-02 NOTE — Op Note (Signed)
[facility]  Patient:    Sharon, [name]                       MRN: [id number] Proc. Date: 04/24/01 Adm. Date:  [id number] Attending:  Abigail Miyamoto A                           Operative Report  PREOPERATIVE DIAGNOSIS:  Thrombosed hemorrhoids.  POSTOPERATIVE DIAGNOSIS:  Thrombosed hemorrhoids.  PROCEDURE:  Examination under anesthesia and hemorrhoidectomy.  SURGEON:  Dr. Riley Lam A. Magnus Ivan.  ANESTHESIA:  General endotracheal and 0.25% Marcaine with epinephrine.  ESTIMATED BLOOD LOSS:  Minimal.  INDICATIONS FOR PROCEDURE:  [name] is a 77 year old female who presented with bleeding internal hemorrhoids. She underwent injection of sclerosing agent. She presented three days later with thrombosed necrotic external hemorrhoids. An incision and drainage was performed in the office; however, two days later the patient still had persistent pain and edematous external hemorrhoids. These were unrelieved with sitz baths. Secondary to this, a decision was made to proceed to the operating room for a hemorrhoidectomy.  DESCRIPTION OF PROCEDURE:  The patient was brought to the operating room, identified as [name]. She was placed supine on the operating table and anesthesia was induced. The patient was then placed in the prone position. Her perineum and perianal area were then prepped and draped in the usual sterile fashion. The ______ retractor was inserted into the oral cavity. The patient was found to have a very large external hemorrhoidal column at the 9 oclock position with the prone. The area of the previous incision was identified as well as the necrotic surrounding tissue. The hemorrhoidal column was then grasped with a Kelly clamp. A 2-0 chromic suture was then sewn at the most proximal portion of this. The hemorrhoid was then excised circumferentially with the electrocautery. The defect was then closed with a running interlock 2-0 chromic  suture. A separate figure-of-eight chromic suture was placed distal to this to achieve hemostasis. Hemostasis appeared to be achieved. The rest of the anal canal was examined and was relatively unremarkable except for some mildly injected tissue on the 3 oclock position from the previous sclerosing agent.  A piece of ______ was inserted into the anal canal. The anal canal was then incised circumferentially with the 0.25% Marcaine with epinephrine. The patient tolerated the procedure well. All counts were correct at the end of the procedure. The patient was then turned back in the supine position, extubated in the operating room and taken in stable condition to the recovery room. DD:  04/24/01 TD:  04/24/01 Job: [id number]

## 2011-09-08 ENCOUNTER — Other Ambulatory Visit: Payer: Self-pay | Admitting: Family Medicine

## 2011-09-14 ENCOUNTER — Ambulatory Visit (INDEPENDENT_AMBULATORY_CARE_PROVIDER_SITE_OTHER): Payer: Medicare Other | Admitting: Family Medicine

## 2011-09-14 ENCOUNTER — Encounter: Payer: Self-pay | Admitting: Family Medicine

## 2011-09-14 VITALS — BP 120/80 | HR 83 | Temp 97.9°F | Ht 62.0 in | Wt 152.4 lb

## 2011-09-14 DIAGNOSIS — R319 Hematuria, unspecified: Secondary | ICD-10-CM

## 2011-09-14 DIAGNOSIS — M899 Disorder of bone, unspecified: Secondary | ICD-10-CM

## 2011-09-14 DIAGNOSIS — M8580 Other specified disorders of bone density and structure, unspecified site: Secondary | ICD-10-CM

## 2011-09-14 DIAGNOSIS — I10 Essential (primary) hypertension: Secondary | ICD-10-CM

## 2011-09-14 DIAGNOSIS — E039 Hypothyroidism, unspecified: Secondary | ICD-10-CM

## 2011-09-14 DIAGNOSIS — Z0000 Encounter for general adult medical examination without abnormal findings: Secondary | ICD-10-CM

## 2011-09-14 LAB — HEPATIC FUNCTION PANEL
ALT: 17 U/L (ref 0–35)
AST: 23 U/L (ref 0–37)
Albumin: 3.9 g/dL (ref 3.5–5.2)
Alkaline Phosphatase: 52 U/L (ref 39–117)
Bilirubin, Direct: 0 mg/dL (ref 0.0–0.3)
Total Bilirubin: 0.6 mg/dL (ref 0.3–1.2)
Total Protein: 7.3 g/dL (ref 6.0–8.3)

## 2011-09-14 LAB — BASIC METABOLIC PANEL WITH GFR
BUN: 17 mg/dL (ref 6–23)
CO2: 31 meq/L (ref 19–32)
Calcium: 9.9 mg/dL (ref 8.4–10.5)
Chloride: 103 meq/L (ref 96–112)
Creatinine, Ser: 0.7 mg/dL (ref 0.4–1.2)
GFR: 83.47 mL/min (ref >60.00)
Glucose, Bld: 83 mg/dL (ref 70–99)
Potassium: 3.9 meq/L (ref 3.5–5.1)
Sodium: 140 meq/L (ref 135–145)

## 2011-09-14 LAB — LIPID PANEL
Cholesterol: 216 mg/dL — ABNORMAL HIGH (ref 0–200)
HDL: 72.3 mg/dL (ref >39.00)
Total CHOL/HDL Ratio: 3
Triglycerides: 66 mg/dL (ref 0.0–149.0)
VLDL: 13.2 mg/dL (ref 0.0–40.0)

## 2011-09-14 LAB — CBC WITH DIFFERENTIAL/PLATELET
Basophils Absolute: 0 K/uL (ref 0.0–0.1)
Basophils Relative: 1 % (ref 0.0–3.0)
Eosinophils Absolute: 0.2 K/uL (ref 0.0–0.7)
Eosinophils Relative: 3.7 % (ref 0.0–5.0)
HCT: 35.5 % — ABNORMAL LOW (ref 36.0–46.0)
Hemoglobin: 12 g/dL (ref 12.0–15.0)
Lymphocytes Relative: 34.5 % (ref 12.0–46.0)
Lymphs Abs: 1.7 K/uL (ref 0.7–4.0)
MCHC: 33.7 g/dL (ref 30.0–36.0)
MCV: 94.6 fl (ref 78.0–100.0)
Monocytes Absolute: 0.5 K/uL (ref 0.1–1.0)
Monocytes Relative: 10.5 % (ref 3.0–12.0)
Neutro Abs: 2.5 K/uL (ref 1.4–7.7)
Neutrophils Relative %: 50.3 % (ref 43.0–77.0)
Platelets: 207 K/uL (ref 150.0–400.0)
RBC: 3.76 Mil/uL — ABNORMAL LOW (ref 3.87–5.11)
RDW: 13.3 % (ref 11.5–14.6)
WBC: 5 K/uL (ref 4.5–10.5)

## 2011-09-14 LAB — POCT URINALYSIS DIPSTICK
Glucose, UA: NEGATIVE
Nitrite, UA: NEGATIVE
Protein, UA: NEGATIVE
Urobilinogen, UA: 0.2

## 2011-09-14 LAB — LDL CHOLESTEROL, DIRECT: Direct LDL: 154.6 mg/dL

## 2011-09-14 LAB — TSH: TSH: 1.41 u[IU]/mL (ref 0.35–5.50)

## 2011-09-14 MED ORDER — LISINOPRIL-HYDROCHLOROTHIAZIDE 10-12.5 MG PO TABS: 1.0000 | ORAL_TABLET | Freq: Every day | ORAL | Status: DC

## 2011-09-14 MED ORDER — LEVOTHYROXINE SODIUM 25 MCG PO TABS: ORAL_TABLET | ORAL | Status: DC

## 2011-09-14 MED ORDER — ALENDRONATE SODIUM 70 MG PO TABS: ORAL_TABLET | ORAL | Status: DC

## 2011-09-14 MED ORDER — ZOSTER VACCINE LIVE 19400 UNT/0.65ML ~~LOC~~ SOLR: 0.6500 mL | Freq: Once | SUBCUTANEOUS | Status: AC

## 2011-09-14 MED ORDER — TETANUS-DIPHTH-ACELL PERTUSSIS 5-2.5-18.5 LF-MCG/0.5 IM SUSP: 0.5000 mL | Freq: Once | INTRAMUSCULAR | Status: DC

## 2011-09-14 NOTE — Progress Notes (Signed)
Subjective:    Sharon [name] is a 77 y.o. female who presents for Medicare Annual/Subsequent preventive examination.  Preventive Screening-Counseling & Management  Tobacco History  Smoking status  . Never Smoker   Smokeless tobacco  . Never Used     Problems Prior to Visit 1.   Current Problems (verified) Patient Active Problem List  Diagnoses  . LYMPHOMA  . HYPOTHYROIDISM  . HYPERTENSION  . OSTEOPENIA    Medications Prior to Visit Current Outpatient Prescriptions on File Prior to Visit  Medication Sig Dispense Refill  . alendronate (FOSAMAX) 70 MG tablet TAKE 1 TABLET BY MOUTH EVERY WEEK  12 tablet  0  . levothyroxine (SYNTHROID, LEVOTHROID) 25 MCG tablet TAKE 1 TABLET BY MOUTH EVERY DAY  90 tablet  0    Current Medications (verified) Current Outpatient Prescriptions  Medication Sig Dispense Refill  . alendronate (FOSAMAX) 70 MG tablet TAKE 1 TABLET BY MOUTH EVERY WEEK  12 tablet  0  . Calcium Carbonate-Vitamin D (CALCIUM + D) 600-200 MG-UNIT TABS Take 2 tablets by mouth daily.        . cholecalciferol (VITAMIN D) 1000 UNITS tablet Take 1,000 Units by mouth daily.        . ferrous gluconate (FERGON) 325 MG tablet Take 325 mg by mouth daily with breakfast.        . Flaxseed, Linseed, (FLAX SEED OIL) 1000 MG CAPS Take 1 capsule by mouth daily.        Marland Kitchen levothyroxine (SYNTHROID, LEVOTHROID) 25 MCG tablet TAKE 1 TABLET BY MOUTH EVERY DAY  90 tablet  0  . lisinopril-hydrochlorothiazide (PRINZIDE,ZESTORETIC) 10-12.5 MG per tablet Take 1 tablet by mouth daily.        . Multiple Vitamin (MULTIVITAMIN) tablet Take 1 tablet by mouth daily.        . vitamin B-12 (CYANOCOBALAMIN) 1000 MCG tablet Take 1,000 mcg by mouth daily.        . TDaP (BOOSTRIX) 5-2.5-18.5 LF-MCG/0.5 injection Inject 0.5 mLs into the muscle once.  0.5 mL  0  . zoster vaccine live, PF, (ZOSTAVAX) 16109 UNT/0.65ML injection Inject 19,400 Units into the skin once.  1 vial  0     Allergies  (verified) Review of patient's allergies indicates no known allergies.   PAST HISTORY  Family History Family History  Problem Relation Age of Onset  . Dementia    . COPD    . Hip fracture      Social History History  Substance Use Topics  . Smoking status: Never Smoker   . Smokeless tobacco: Never Used  . Alcohol Use: No     Are there smokers in your home (other than you)? No  Risk Factors Current exercise habits: Gym/ health club routine includes cardio and treadmill.  Dietary issues discussed: NA   Cardiac risk factors: advanced age (older than 10 for men, 26 for women).  Depression Screen (Note: if answer to either of the following is "Yes", a more complete depression screening is indicated)   Over the past two weeks, have you felt down, depressed or hopeless? No  Over the past two weeks, have you felt little interest or pleasure in doing things? No  Have you lost interest or pleasure in daily life? No  Do you often feel hopeless? No  Do you cry easily over simple problems? No  Activities of Daily Living In your present state of health, do you have any difficulty performing the following activities?:  Driving? No Managing money?  No  Feeding yourself? No Getting from bed to chair? No Climbing a flight of stairs? No Preparing food and eating?: No Bathing or showering? No Getting dressed: No Getting to the toilet? No Using the toilet:No Moving around from place to place: No In the past year have you fallen or had a near fall?:No   Are you sexually active?  Yes  Do you have more than one partner?  No  Hearing Difficulties: No Do you often ask people to speak up or repeat themselves? No Do you experience ringing or noises in your ears? No Do you have difficulty understanding soft or whispered voices? No   Do you feel that you have a problem with memory? No  Do you often misplace items? No  Do you feel safe at home?  Yes  Cognitive Testing  Alert? Yes   Normal Appearance?Yes  Oriented to person? Yes  Place? Yes   Time? Yes  Recall of three objects?  Yes  Can perform simple calculations? Yes  Displays appropriate judgment?Yes  Can read the correct time from a watch face?Yes   Advanced Directives have been discussed with the patient? Yes  List the Names of Other Physician/Practitioners you currently use: 1.  opth- cook 2. Dentist--[place] Indicate any recent Medical Services you may have received from other than [facility] providers in the past year (date may be approximate).  Immunization History  Administered Date(s) Administered  . Influenza Whole 08/15/2006, 08/21/2009, 08/24/2010  . Pneumococcal Polysaccharide 02/18/2003  . Td 09/15/1998    Screening Tests Health Maintenance  Topic Date Due  . Zostavax  08/28/1994  . Tetanus/tdap  09/15/2008  . Mammogram  01/18/2012  . Influenza Vaccine  08/15/2012  . Colonoscopy  10/26/2015  . Pneumococcal Polysaccharide Vaccine Age 75 And Over  Completed    All answers were reviewed with the patient and necessary referrals were made:  Loreen Freud, DO   09/14/2011   History reviewed: allergies, current medications, past family history, past medical history, past social history, past surgical history and problem list  Review of Systems  Review of Systems  Constitutional: Negative for activity change, appetite change and fatigue.  HENT: Negative for hearing loss, congestion, tinnitus and ear discharge.  dentist q45m Eyes: Negative for visual disturbance (see optho q1y -- vision corrected to 20/20 with glasses).  Respiratory: Negative for cough, chest tightness and shortness of breath.   Cardiovascular: Negative for chest pain, palpitations and leg swelling.  Gastrointestinal: Negative for abdominal pain, diarrhea, constipation and abdominal distention.  Genitourinary: Negative for urgency, frequency, decreased urine volume and difficulty urinating.  Musculoskeletal: Negative for back pain,  arthralgias and gait problem.  Skin: Negative for color change, pallor and rash.  Neurological: Negative for dizziness, light-headedness, numbness and headaches.  Hematological: Negative for adenopathy. Does not bruise/bleed easily.  Psychiatric/Behavioral: Negative for suicidal ideas, confusion, sleep disturbance, self-injury, dysphoric mood, decreased concentration and agitation.  Pt is able to read and write and can do all ADLs No risk for falling No abuse/ violence in home      Objective:     Vision by Snellen chart: opth  Body mass index is 27.87 kg/(m^2). BP 120/80  Pulse 83  Temp(Src) 97.9 F (36.6 C) (Oral)  Ht 5\' 2"  (1.575 m)  Wt 152 lb 6.4 oz (69.128 kg)  BMI 27.87 kg/m2  SpO2 94%  BP 120/80  Pulse 83  Temp(Src) 97.9 F (36.6 C) (Oral)  Ht 5\' 2"  (1.575 m)  Wt 152 lb 6.4 oz (69.128 kg)  BMI 27.87 kg/m2  SpO2 94%  General Appearance:    Alert, cooperative, no distress, appears stated age  Head:    Normocephalic, without obvious abnormality, atraumatic  Eyes:    PERRL, conjunctiva/corneas clear, EOM's intact, fundi    benign, both eyes  Ears:    Normal TM's and external ear canals, both ears  Nose:   Nares normal, septum midline, mucosa normal, no drainage    or sinus tenderness  Throat:   Lips, mucosa, and tongue normal; teeth and gums normal  Neck:   Supple, symmetrical, trachea midline, no adenopathy;    thyroid:  no enlargement/tenderness/nodules; no carotid   bruit or JVD  Back:     Symmetric, no curvature, ROM normal, no CVA tenderness  Lungs:     Clear to auscultation bilaterally, respirations unlabored  Chest Wall:    No tenderness or deformity   Heart:    Regular rate and rhythm, S1 and S2 normal, no murmur, rub   or gallop  Breast Exam:    No tenderness, masses, or nipple abnormality  Abdomen:     Soft, non-tender, bowel sounds active all four quadrants,    no masses, no organomegaly  Genitalia:   deferred  Rectal:    Deferred   Extremities:    Extremities normal, atraumatic, no cyanosis or edema  Pulses:   2+ and symmetric all extremities  Skin:   Skin color, texture, turgor normal, no rashes or lesions  Lymph nodes:   Cervical, supraclavicular, and axillary nodes normal  Neurologic:   CNII-XII intact, normal strength, sensation and reflexes    throughout       Assessment:    CPE HTN-- con't meds,  controlled Hypothyroid-- check labs     Plan:    ghm utd During the course of the visit the patient was educated and counseled about appropriate screening and preventive services including:    Pneumococcal vaccine   Influenza vaccine  Td vaccine  Screening mammography  Bone densitometry screening  Colorectal cancer screening  Advanced directives: will go to lawyer  Diet review for nutrition referral? Yes ____  Not Indicated ___X  Patient Instructions (the written plan) was given to the patient.  Medicare Attestation I have personally reviewed: The patient's medical and social history Their use of alcohol, tobacco or illicit drugs Their current medications and supplements The patient's functional ability including ADLs,fall risks, home safety risks, cognitive, and hearing and visual impairment Diet and physical activities Evidence for depression or mood disorders  The patient's weight, height, BMI, and visual acuity have been recorded in the chart.  I have made referrals, counseling, and provided education to the patient based on review of the above and I have provided the patient with a written personalized care plan for preventive services.     Loreen Freud, DO   09/14/2011

## 2011-09-14 NOTE — Patient Instructions (Signed)

## 2011-09-17 LAB — URINE CULTURE: Colony Count: 25000

## 2011-10-06 ENCOUNTER — Other Ambulatory Visit: Payer: Self-pay | Admitting: Family Medicine

## 2011-12-13 ENCOUNTER — Other Ambulatory Visit: Payer: Self-pay | Admitting: Family Medicine

## 2011-12-13 DIAGNOSIS — Z1231 Encounter for screening mammogram for malignant neoplasm of breast: Secondary | ICD-10-CM

## 2012-01-19 ENCOUNTER — Ambulatory Visit
Admission: RE | Admit: 2012-01-19 | Discharge: 2012-01-19 | Disposition: A | Discharge status: Home / Self Care | Payer: Medicare Other | Source: Ambulatory Visit | Attending: Family Medicine | Admitting: Family Medicine

## 2012-01-19 DIAGNOSIS — Z1231 Encounter for screening mammogram for malignant neoplasm of breast: Secondary | ICD-10-CM

## 2012-09-25 ENCOUNTER — Encounter: Payer: Self-pay | Admitting: Family Medicine

## 2012-09-25 ENCOUNTER — Ambulatory Visit (INDEPENDENT_AMBULATORY_CARE_PROVIDER_SITE_OTHER): Payer: Medicare Other | Admitting: Family Medicine

## 2012-09-25 VITALS — BP 120/72 | HR 78 | Temp 98.0°F | Ht 62.0 in | Wt 151.6 lb

## 2012-09-25 DIAGNOSIS — M899 Disorder of bone, unspecified: Secondary | ICD-10-CM

## 2012-09-25 DIAGNOSIS — N390 Urinary tract infection, site not specified: Secondary | ICD-10-CM

## 2012-09-25 DIAGNOSIS — Z23 Encounter for immunization: Secondary | ICD-10-CM

## 2012-09-25 DIAGNOSIS — M8580 Other specified disorders of bone density and structure, unspecified site: Secondary | ICD-10-CM

## 2012-09-25 DIAGNOSIS — Z0000 Encounter for general adult medical examination without abnormal findings: Secondary | ICD-10-CM

## 2012-09-25 DIAGNOSIS — E039 Hypothyroidism, unspecified: Secondary | ICD-10-CM

## 2012-09-25 DIAGNOSIS — I10 Essential (primary) hypertension: Secondary | ICD-10-CM

## 2012-09-25 LAB — POCT URINALYSIS DIPSTICK
Bilirubin, UA: NEGATIVE
Glucose, UA: NEGATIVE
Nitrite, UA: NEGATIVE
Urobilinogen, UA: 0.2

## 2012-09-25 LAB — HEPATIC FUNCTION PANEL
ALT: 17 U/L (ref 0–35)
AST: 25 U/L (ref 0–37)
Total Bilirubin: 0.5 mg/dL (ref 0.3–1.2)
Total Protein: 7.1 g/dL (ref 6.0–8.3)

## 2012-09-25 LAB — BASIC METABOLIC PANEL
BUN: 17 mg/dL (ref 6–23)
Chloride: 100 mEq/L (ref 96–112)
GFR: 61.96 mL/min (ref >60.00)
Potassium: 3.8 mEq/L (ref 3.5–5.1)
Sodium: 138 mEq/L (ref 135–145)

## 2012-09-25 LAB — LIPID PANEL
Cholesterol: 199 mg/dL (ref 0–200)
LDL Cholesterol: 124 mg/dL — ABNORMAL HIGH (ref 0–99)
Total CHOL/HDL Ratio: 3

## 2012-09-25 LAB — CBC WITH DIFFERENTIAL/PLATELET
Basophils Relative: 1.1 % (ref 0.0–3.0)
Eosinophils Relative: 4.5 % (ref 0.0–5.0)
HCT: 33.8 % — ABNORMAL LOW (ref 36.0–46.0)
Hemoglobin: 11.2 g/dL — ABNORMAL LOW (ref 12.0–15.0)
Lymphs Abs: 1.7 10*3/uL (ref 0.7–4.0)
MCV: 92.2 fl (ref 78.0–100.0)
Monocytes Relative: 12.7 % — ABNORMAL HIGH (ref 3.0–12.0)
Platelets: 181 10*3/uL (ref 150.0–400.0)
RBC: 3.67 Mil/uL — ABNORMAL LOW (ref 3.87–5.11)
WBC: 4.6 10*3/uL (ref 4.5–10.5)

## 2012-09-25 LAB — TSH: TSH: 1.49 u[IU]/mL (ref 0.35–5.50)

## 2012-09-25 MED ORDER — ZOSTER VACCINE LIVE 19400 UNT/0.65ML ~~LOC~~ SOLR: 0.6500 mL | Freq: Once | SUBCUTANEOUS | Status: AC

## 2012-09-25 MED ORDER — ALENDRONATE SODIUM 70 MG PO TABS: ORAL_TABLET | ORAL | Status: AC

## 2012-09-25 MED ORDER — LISINOPRIL-HYDROCHLOROTHIAZIDE 10-12.5 MG PO TABS: 1.0000 | ORAL_TABLET | Freq: Every day | ORAL | Status: DC

## 2012-09-25 MED ORDER — LEVOTHYROXINE SODIUM 25 MCG PO TABS: ORAL_TABLET | ORAL | Status: AC

## 2012-09-25 NOTE — Addendum Note (Signed)
Addended by: Silvio Pate D on: 09/25/2012 02:55 PM   Modules accepted: Orders

## 2012-09-25 NOTE — Patient Instructions (Signed)
Preventive Care for Adults, Female A healthy lifestyle and preventive care can promote health and wellness. Preventive health guidelines for women include the following key practices.  A routine yearly physical is a good way to check with your caregiver about your health and preventive screening. It is a chance to share any concerns and updates on your health, and to receive a thorough exam.  Visit your dentist for a routine exam and preventive care every 6 months. Brush your teeth twice a day and floss once a day. Good oral hygiene prevents tooth decay and gum disease.  The frequency of eye exams is based on your age, health, family medical history, use of contact lenses, and other factors. Follow your caregiver's recommendations for frequency of eye exams.  Eat a healthy diet. Foods like vegetables, fruits, whole grains, low-fat dairy products, and lean protein foods contain the nutrients you need without too many calories. Decrease your intake of foods high in solid fats, added sugars, and salt. Eat the right amount of calories for you.Get information about a proper diet from your caregiver, if necessary.  Regular physical exercise is one of the most important things you can do for your health. Most adults should get at least 150 minutes of moderate-intensity exercise (any activity that increases your heart rate and causes you to sweat) each week. In addition, most adults need muscle-strengthening exercises on 2 or more days a week.  Maintain a healthy weight. The body mass index (BMI) is a screening tool to identify possible weight problems. It provides an estimate of body fat based on height and weight. Your caregiver can help determine your BMI, and can help you achieve or maintain a healthy weight.For adults 20 years and older:  A BMI below 18.5 is considered underweight.  A BMI of 18.5 to 24.9 is normal.  A BMI of 25 to 29.9 is considered overweight.  A BMI of 30 and above is  considered obese.  Maintain normal blood lipids and cholesterol levels by exercising and minimizing your intake of saturated fat. Eat a balanced diet with plenty of fruit and vegetables. Blood tests for lipids and cholesterol should begin at age 20 and be repeated every 5 years. If your lipid or cholesterol levels are high, you are over 50, or you are at high risk for heart disease, you may need your cholesterol levels checked more frequently.Ongoing high lipid and cholesterol levels should be treated with medicines if diet and exercise are not effective.  If you smoke, find out from your caregiver how to quit. If you do not use tobacco, do not start.  If you are pregnant, do not drink alcohol. If you are breastfeeding, be very cautious about drinking alcohol. If you are not pregnant and choose to drink alcohol, do not exceed 1 drink per day. One drink is considered to be 12 ounces (355 mL) of beer, 5 ounces (148 mL) of wine, or 1.5 ounces (44 mL) of liquor.  Avoid use of street drugs. Do not share needles with anyone. Ask for help if you need support or instructions about stopping the use of drugs.  High blood pressure causes heart disease and increases the risk of stroke. Your blood pressure should be checked at least every 1 to 2 years. Ongoing high blood pressure should be treated with medicines if weight loss and exercise are not effective.  If you are 55 to 76 years old, ask your caregiver if you should take aspirin to prevent strokes.  Diabetes   screening involves taking a blood sample to check your fasting blood sugar level. This should be done once every 3 years, after age 45, if you are within normal weight and without risk factors for diabetes. Testing should be considered at a younger age or be carried out more frequently if you are overweight and have at least 1 risk factor for diabetes.  Breast cancer screening is essential preventive care for women. You should practice "breast  self-awareness." This means understanding the normal appearance and feel of your breasts and may include breast self-examination. Any changes detected, no matter how small, should be reported to a caregiver. Women in their 20s and 30s should have a clinical breast exam (CBE) by a caregiver as part of a regular health exam every 1 to 3 years. After age 40, women should have a CBE every year. Starting at age 40, women should consider having a mammography (breast X-ray test) every year. Women who have a family history of breast cancer should talk to their caregiver about genetic screening. Women at a high risk of breast cancer should talk to their caregivers about having magnetic resonance imaging (MRI) and a mammography every year.  The Pap test is a screening test for cervical cancer. A Pap test can show cell changes on the cervix that might become cervical cancer if left untreated. A Pap test is a procedure in which cells are obtained and examined from the lower end of the uterus (cervix).  Women should have a Pap test starting at age 21.  Between ages 21 and 29, Pap tests should be repeated every 2 years.  Beginning at age 30, you should have a Pap test every 3 years as long as the past 3 Pap tests have been normal.  Some women have medical problems that increase the chance of getting cervical cancer. Talk to your caregiver about these problems. It is especially important to talk to your caregiver if a new problem develops soon after your last Pap test. In these cases, your caregiver may recommend more frequent screening and Pap tests.  The above recommendations are the same for women who have or have not gotten the vaccine for human papillomavirus (HPV).  If you had a hysterectomy for a problem that was not cancer or a condition that could lead to cancer, then you no longer need Pap tests. Even if you no longer need a Pap test, a regular exam is a good idea to make sure no other problems are  starting.  If you are between ages 65 and 70, and you have had normal Pap tests going back 10 years, you no longer need Pap tests. Even if you no longer need a Pap test, a regular exam is a good idea to make sure no other problems are starting.  If you have had past treatment for cervical cancer or a condition that could lead to cancer, you need Pap tests and screening for cancer for at least 20 years after your treatment.  If Pap tests have been discontinued, risk factors (such as a new sexual partner) need to be reassessed to determine if screening should be resumed.  The HPV test is an additional test that may be used for cervical cancer screening. The HPV test looks for the virus that can cause the cell changes on the cervix. The cells collected during the Pap test can be tested for HPV. The HPV test could be used to screen women aged 30 years and older, and should   be used in women of any age who have unclear Pap test results. After the age of 30, women should have HPV testing at the same frequency as a Pap test.  Colorectal cancer can be detected and often prevented. Most routine colorectal cancer screening begins at the age of 50 and continues through age 75. However, your caregiver may recommend screening at an earlier age if you have risk factors for colon cancer. On a yearly basis, your caregiver may provide home test kits to check for hidden blood in the stool. Use of a small camera at the end of a tube, to directly examine the colon (sigmoidoscopy or colonoscopy), can detect the earliest forms of colorectal cancer. Talk to your caregiver about this at age 50, when routine screening begins. Direct examination of the colon should be repeated every 5 to 10 years through age 75, unless early forms of pre-cancerous polyps or small growths are found.  Hepatitis C blood testing is recommended for all people born from 1945 through 1965 and any individual with known risks for hepatitis C.  Practice  safe sex. Use condoms and avoid high-risk sexual practices to reduce the spread of sexually transmitted infections (STIs). STIs include gonorrhea, chlamydia, syphilis, trichomonas, herpes, HPV, and human immunodeficiency virus (HIV). Herpes, HIV, and HPV are viral illnesses that have no cure. They can result in disability, cancer, and death. Sexually active women aged 25 and younger should be checked for chlamydia. Older women with new or multiple partners should also be tested for chlamydia. Testing for other STIs is recommended if you are sexually active and at increased risk.  Osteoporosis is a disease in which the bones lose minerals and strength with aging. This can result in serious bone fractures. The risk of osteoporosis can be identified using a bone density scan. Women ages 65 and over and women at risk for fractures or osteoporosis should discuss screening with their caregivers. Ask your caregiver whether you should take a calcium supplement or vitamin D to reduce the rate of osteoporosis.  Menopause can be associated with physical symptoms and risks. Hormone replacement therapy is available to decrease symptoms and risks. You should talk to your caregiver about whether hormone replacement therapy is right for you.  Use sunscreen with sun protection factor (SPF) of 30 or more. Apply sunscreen liberally and repeatedly throughout the day. You should seek shade when your shadow is shorter than you. Protect yourself by wearing long sleeves, pants, a wide-brimmed hat, and sunglasses year round, whenever you are outdoors.  Once a month, do a whole body skin exam, using a mirror to look at the skin on your back. Notify your caregiver of new moles, moles that have irregular borders, moles that are larger than a pencil eraser, or moles that have changed in shape or color.  Stay current with required immunizations.  Influenza. You need a dose every fall (or winter). The composition of the flu vaccine  changes each year, so being vaccinated once is not enough.  Pneumococcal polysaccharide. You need 1 to 2 doses if you smoke cigarettes or if you have certain chronic medical conditions. You need 1 dose at age 65 (or older) if you have never been vaccinated.  Tetanus, diphtheria, pertussis (Tdap, Td). Get 1 dose of Tdap vaccine if you are younger than age 65, are over 65 and have contact with an infant, are a healthcare worker, are pregnant, or simply want to be protected from whooping cough. After that, you need a Td   booster dose every 10 years. Consult your caregiver if you have not had at least 3 tetanus and diphtheria-containing shots sometime in your life or have a deep or dirty wound.  HPV. You need this vaccine if you are a woman age 26 or younger. The vaccine is given in 3 doses over 6 months.  Measles, mumps, rubella (MMR). You need at least 1 dose of MMR if you were born in 1957 or later. You may also need a second dose.  Meningococcal. If you are age 19 to 21 and a first-year college student living in a residence hall, or have one of several medical conditions, you need to get vaccinated against meningococcal disease. You may also need additional booster doses.  Zoster (shingles). If you are age 60 or older, you should get this vaccine.  Varicella (chickenpox). If you have never had chickenpox or you were vaccinated but received only 1 dose, talk to your caregiver to find out if you need this vaccine.  Hepatitis A. You need this vaccine if you have a specific risk factor for hepatitis A virus infection or you simply wish to be protected from this disease. The vaccine is usually given as 2 doses, 6 to 18 months apart.  Hepatitis B. You need this vaccine if you have a specific risk factor for hepatitis B virus infection or you simply wish to be protected from this disease. The vaccine is given in 3 doses, usually over 6 months. Preventive Services / Frequency Ages 19 to 39  Blood  pressure check.** / Every 1 to 2 years.  Lipid and cholesterol check.** / Every 5 years beginning at age 20.  Clinical breast exam.** / Every 3 years for women in their 20s and 30s.  Pap test.** / Every 2 years from ages 21 through 29. Every 3 years starting at age 30 through age 65 or 70 with a history of 3 consecutive normal Pap tests.  HPV screening.** / Every 3 years from ages 30 through ages 65 to 70 with a history of 3 consecutive normal Pap tests.  Hepatitis C blood test.** / For any individual with known risks for hepatitis C.  Skin self-exam. / Monthly.  Influenza immunization.** / Every year.  Pneumococcal polysaccharide immunization.** / 1 to 2 doses if you smoke cigarettes or if you have certain chronic medical conditions.  Tetanus, diphtheria, pertussis (Tdap, Td) immunization. / A one-time dose of Tdap vaccine. After that, you need a Td booster dose every 10 years.  HPV immunization. / 3 doses over 6 months, if you are 26 and younger.  Measles, mumps, rubella (MMR) immunization. / You need at least 1 dose of MMR if you were born in 1957 or later. You may also need a second dose.  Meningococcal immunization. / 1 dose if you are age 19 to 21 and a first-year college student living in a residence hall, or have one of several medical conditions, you need to get vaccinated against meningococcal disease. You may also need additional booster doses.  Varicella immunization.** / Consult your caregiver.  Hepatitis A immunization.** / Consult your caregiver. 2 doses, 6 to 18 months apart.  Hepatitis B immunization.** / Consult your caregiver. 3 doses usually over 6 months. Ages 40 to 64  Blood pressure check.** / Every 1 to 2 years.  Lipid and cholesterol check.** / Every 5 years beginning at age 20.  Clinical breast exam.** / Every year after age 40.  Mammogram.** / Every year beginning at age 40   and continuing for as long as you are in good health. Consult with your  caregiver.  Pap test.** / Every 3 years starting at age 30 through age 65 or 70 with a history of 3 consecutive normal Pap tests.  HPV screening.** / Every 3 years from ages 30 through ages 65 to 70 with a history of 3 consecutive normal Pap tests.  Fecal occult blood test (FOBT) of stool. / Every year beginning at age 50 and continuing until age 75. You may not need to do this test if you get a colonoscopy every 10 years.  Flexible sigmoidoscopy or colonoscopy.** / Every 5 years for a flexible sigmoidoscopy or every 10 years for a colonoscopy beginning at age 50 and continuing until age 75.  Hepatitis C blood test.** / For all people born from 1945 through 1965 and any individual with known risks for hepatitis C.  Skin self-exam. / Monthly.  Influenza immunization.** / Every year.  Pneumococcal polysaccharide immunization.** / 1 to 2 doses if you smoke cigarettes or if you have certain chronic medical conditions.  Tetanus, diphtheria, pertussis (Tdap, Td) immunization.** / A one-time dose of Tdap vaccine. After that, you need a Td booster dose every 10 years.  Measles, mumps, rubella (MMR) immunization. / You need at least 1 dose of MMR if you were born in 1957 or later. You may also need a second dose.  Varicella immunization.** / Consult your caregiver.  Meningococcal immunization.** / Consult your caregiver.  Hepatitis A immunization.** / Consult your caregiver. 2 doses, 6 to 18 months apart.  Hepatitis B immunization.** / Consult your caregiver. 3 doses, usually over 6 months. Ages 65 and over  Blood pressure check.** / Every 1 to 2 years.  Lipid and cholesterol check.** / Every 5 years beginning at age 20.  Clinical breast exam.** / Every year after age 40.  Mammogram.** / Every year beginning at age 40 and continuing for as long as you are in good health. Consult with your caregiver.  Pap test.** / Every 3 years starting at age 30 through age 65 or 70 with a 3  consecutive normal Pap tests. Testing can be stopped between 65 and 70 with 3 consecutive normal Pap tests and no abnormal Pap or HPV tests in the past 10 years.  HPV screening.** / Every 3 years from ages 30 through ages 65 or 70 with a history of 3 consecutive normal Pap tests. Testing can be stopped between 65 and 70 with 3 consecutive normal Pap tests and no abnormal Pap or HPV tests in the past 10 years.  Fecal occult blood test (FOBT) of stool. / Every year beginning at age 50 and continuing until age 75. You may not need to do this test if you get a colonoscopy every 10 years.  Flexible sigmoidoscopy or colonoscopy.** / Every 5 years for a flexible sigmoidoscopy or every 10 years for a colonoscopy beginning at age 50 and continuing until age 75.  Hepatitis C blood test.** / For all people born from 1945 through 1965 and any individual with known risks for hepatitis C.  Osteoporosis screening.** / A one-time screening for women ages 65 and over and women at risk for fractures or osteoporosis.  Skin self-exam. / Monthly.  Influenza immunization.** / Every year.  Pneumococcal polysaccharide immunization.** / 1 dose at age 65 (or older) if you have never been vaccinated.  Tetanus, diphtheria, pertussis (Tdap, Td) immunization. / A one-time dose of Tdap vaccine if you are over   65 and have contact with an infant, are a healthcare worker, or simply want to be protected from whooping cough. After that, you need a Td booster dose every 10 years.  Varicella immunization.** / Consult your caregiver.  Meningococcal immunization.** / Consult your caregiver.  Hepatitis A immunization.** / Consult your caregiver. 2 doses, 6 to 18 months apart.  Hepatitis B immunization.** / Check with your caregiver. 3 doses, usually over 6 months. ** Family history and personal history of risk and conditions may change your caregiver's recommendations. Document Released: 12/28/2001 Document Revised: 01/24/2012  Document Reviewed: 03/29/2011 [employer] Patient Information 2013 [employer]  

## 2012-09-25 NOTE — Assessment & Plan Note (Signed)
Check bmd 

## 2012-09-25 NOTE — Assessment & Plan Note (Signed)
Check labs 

## 2012-09-25 NOTE — Progress Notes (Signed)
Subjective:    Sharon [name] is a 78 y.o. female who presents for Medicare Annual/Subsequent preventive examination.  Preventive Screening-Counseling & Management  Tobacco History  Smoking status  . Never Smoker   Smokeless tobacco  . Never Used     Problems Prior to Visit 1. na  Current Problems (verified) Patient Active Problem List  Diagnosis  . LYMPHOMA  . HYPOTHYROIDISM  . HYPERTENSION  . OSTEOPENIA    Medications Prior to Visit Current Outpatient Prescriptions on File Prior to Visit  Medication Sig Dispense Refill  . Calcium Carbonate-Vitamin D (CALCIUM + D) 600-200 MG-UNIT TABS Take 2 tablets by mouth daily.        . cholecalciferol (VITAMIN D) 1000 UNITS tablet Take 1,000 Units by mouth daily.        . ferrous gluconate (FERGON) 325 MG tablet Take 325 mg by mouth daily with breakfast.        . Flaxseed, Linseed, (FLAX SEED OIL) 1000 MG CAPS Take 1 capsule by mouth daily.        . Multiple Vitamin (MULTIVITAMIN) tablet Take 1 tablet by mouth daily.        . vitamin B-12 (CYANOCOBALAMIN) 1000 MCG tablet Take 1,000 mcg by mouth daily.        . [DISCONTINUED] alendronate (FOSAMAX) 70 MG tablet Take with a full glass of water on an empty stomach.  12 tablet  3  . [DISCONTINUED] levothyroxine (SYNTHROID, LEVOTHROID) 25 MCG tablet 1 po qd  90 tablet  3  . [DISCONTINUED] lisinopril-hydrochlorothiazide (PRINZIDE,ZESTORETIC) 10-12.5 MG per tablet TAKE 1 TABLET BY MOUTH EVERY DAY  90 tablet  3    Current Medications (verified) Current Outpatient Prescriptions  Medication Sig Dispense Refill  . alendronate (FOSAMAX) 70 MG tablet Take with a full glass of water on an empty stomach.  12 tablet  3  . Calcium Carbonate-Vitamin D (CALCIUM + D) 600-200 MG-UNIT TABS Take 2 tablets by mouth daily.        . cholecalciferol (VITAMIN D) 1000 UNITS tablet Take 1,000 Units by mouth daily.        . ferrous gluconate (FERGON) 325 MG tablet Take 325 mg by mouth daily with breakfast.         . Flaxseed, Linseed, (FLAX SEED OIL) 1000 MG CAPS Take 1 capsule by mouth daily.        Marland Kitchen levothyroxine (SYNTHROID, LEVOTHROID) 25 MCG tablet 1 po qd  90 tablet  3  . lisinopril-hydrochlorothiazide (PRINZIDE,ZESTORETIC) 10-12.5 MG per tablet Take 1 tablet by mouth daily.  90 tablet  3  . Multiple Vitamin (MULTIVITAMIN) tablet Take 1 tablet by mouth daily.        . vitamin B-12 (CYANOCOBALAMIN) 1000 MCG tablet Take 1,000 mcg by mouth daily.        . [DISCONTINUED] alendronate (FOSAMAX) 70 MG tablet Take with a full glass of water on an empty stomach.  12 tablet  3  . [DISCONTINUED] levothyroxine (SYNTHROID, LEVOTHROID) 25 MCG tablet 1 po qd  90 tablet  3  . [DISCONTINUED] lisinopril-hydrochlorothiazide (PRINZIDE,ZESTORETIC) 10-12.5 MG per tablet TAKE 1 TABLET BY MOUTH EVERY DAY  90 tablet  3  . [DISCONTINUED] lisinopril-hydrochlorothiazide (PRINZIDE,ZESTORETIC) 10-12.5 MG per tablet Take 1 tablet by mouth daily.  90 tablet  3  . zoster vaccine live, PF, (ZOSTAVAX) 45409 UNT/0.65ML injection Inject 19,400 Units into the skin once.  1 vial  0     Allergies (verified) Review of patient's allergies indicates no known allergies.   PAST  HISTORY  Family History Family History  Problem Relation Age of Onset  . Dementia    . COPD    . Hip fracture      Social History History  Substance Use Topics  . Smoking status: Never Smoker   . Smokeless tobacco: Never Used  . Alcohol Use: No     Are there smokers in your home (other than you)? No  Risk Factors Current exercise habits: house work,  caring for hanicap adult child  Dietary issues discussed: na   Cardiac risk factors: advanced age (older than 93 for men, 77 for women), dyslipidemia and hypertension.  Depression Screen (Note: if answer to either of the following is "Yes", a more complete depression screening is indicated)   Over the past two weeks, have you felt down, depressed or hopeless? No  Over the past two weeks, have  you felt little interest or pleasure in doing things? No  Have you lost interest or pleasure in daily life? No  Do you often feel hopeless? No  Do you cry easily over simple problems? No  Activities of Daily Living In your present state of health, do you have any difficulty performing the following activities?:  Driving? No Managing money?  No Feeding yourself? No Getting from bed to chair? No Climbing a flight of stairs? No Preparing food and eating?: No Bathing or showering? No Getting dressed: No Getting to the toilet? No Using the toilet:No Moving around from place to place: No In the past year have you fallen or had a near fall?:No   Are you sexually active?  Yes  Do you have more than one partner?  No  Hearing Difficulties: Yes---+ hearing aids Do you often ask people to speak up or repeat themselves? Yes Do you experience ringing or noises in your ears? No Do you have difficulty understanding soft or whispered voices? Yes   Do you feel that you have a problem with memory? No  Do you often misplace items? No  Do you feel safe at home?  No  Cognitive Testing  Alert? Yes  Normal Appearance?Yes  Oriented to person? Yes  Place? Yes   Time? Yes  Recall of three objects?  Yes  Can perform simple calculations? Yes  Displays appropriate judgment?Yes  Can read the correct time from a watch face?Yes   Advanced Directives have been discussed with the patient? Yes  List the Names of Other Physician/Practitioners you currently use: 1.  opht-- Adriana Simas 2 dentist --  wallren  Indicate any recent Medical Services you may have received from other than [facility] providers in the past year (date may be approximate).  Immunization History  Administered Date(s) Administered  . Influenza Split 09/25/2012  . Influenza Whole 08/15/2006, 08/21/2009, 08/24/2010  . Pneumococcal Polysaccharide 02/18/2003  . Td 09/15/1998    Screening Tests Health Maintenance  Topic Date Due  . Zostavax   08/28/1994  . Tetanus/tdap  09/15/2008  . Mammogram  01/18/2013  . Influenza Vaccine  07/16/2013  . Colonoscopy  10/26/2015  . Pneumococcal Polysaccharide Vaccine Age 79 And Over  Completed    All answers were reviewed with the patient and necessary referrals were made:  Sharon Freud, DO   09/25/2012   History reviewed:  She  has a past medical history of Thyroid disease; Hypertension; and Osteopenia. She  does not have any pertinent problems on file. She  has past surgical history that includes Abdominal hysterectomy and Tonsillectomy. Her family history includes COPD in an  unspecified family member; Dementia in an unspecified family member; and Hip fracture in an unspecified family member. She  reports that she has never smoked. She has never used smokeless tobacco. She reports that she does not drink alcohol or use illicit drugs. She has a current medication list which includes the following prescription(s): alendronate, calcium carbonate-vitamin d, cholecalciferol, ferrous gluconate, flax seed oil, levothyroxine, lisinopril-hydrochlorothiazide, multivitamin, vitamin b-12, and zoster vaccine live (pf). Current Outpatient Prescriptions on File Prior to Visit  Medication Sig Dispense Refill  . Calcium Carbonate-Vitamin D (CALCIUM + D) 600-200 MG-UNIT TABS Take 2 tablets by mouth daily.        . cholecalciferol (VITAMIN D) 1000 UNITS tablet Take 1,000 Units by mouth daily.        . ferrous gluconate (FERGON) 325 MG tablet Take 325 mg by mouth daily with breakfast.        . Flaxseed, Linseed, (FLAX SEED OIL) 1000 MG CAPS Take 1 capsule by mouth daily.        . Multiple Vitamin (MULTIVITAMIN) tablet Take 1 tablet by mouth daily.        . vitamin B-12 (CYANOCOBALAMIN) 1000 MCG tablet Take 1,000 mcg by mouth daily.        . [DISCONTINUED] alendronate (FOSAMAX) 70 MG tablet Take with a full glass of water on an empty stomach.  12 tablet  3  . [DISCONTINUED] levothyroxine (SYNTHROID, LEVOTHROID) 25  MCG tablet 1 po qd  90 tablet  3  . [DISCONTINUED] lisinopril-hydrochlorothiazide (PRINZIDE,ZESTORETIC) 10-12.5 MG per tablet TAKE 1 TABLET BY MOUTH EVERY DAY  90 tablet  3   She  has no known allergies.  Review of Systems  Review of Systems  Constitutional: Negative for activity change, appetite change and fatigue.  HENT: Negative for hearing loss, congestion, tinnitus and ear discharge.   Eyes: Negative for visual disturbance (see optho q1y -- vision corrected to 20/20 with glasses).  Respiratory: Negative for cough, chest tightness and shortness of breath.   Cardiovascular: Negative for chest pain, palpitations and leg swelling.  Gastrointestinal: Negative for abdominal pain, diarrhea, constipation and abdominal distention.  Genitourinary: Negative for urgency, frequency, decreased urine volume and difficulty urinating.  Musculoskeletal: Negative for back pain, arthralgias and gait problem.  Skin: Negative for color change, pallor and rash.  Neurological: Negative for dizziness, light-headedness, numbness and headaches.  Hematological: Negative for adenopathy. Does not bruise/bleed easily.  Psychiatric/Behavioral: Negative for suicidal ideas, confusion, sleep disturbance, self-injury, dysphoric mood, decreased concentration and agitation.  Pt is able to read and write and can do all ADLs No risk for falling No abuse/ violence in home      Objective:     Vision by Snellen chart: opth  Body mass index is 27.73 kg/(m^2). BP 120/72  Pulse 78  Temp 98 F (36.7 C) (Oral)  Ht 5\' 2"  (1.575 m)  Wt 151 lb 9.6 oz (68.765 kg)  BMI 27.73 kg/m2  SpO2 98%  BP 120/72  Pulse 78  Temp 98 F (36.7 C) (Oral)  Ht 5\' 2"  (1.575 m)  Wt 151 lb 9.6 oz (68.765 kg)  BMI 27.73 kg/m2  SpO2 98% General appearance: alert, cooperative, appears stated age and no distress Head: Normocephalic, without obvious abnormality, atraumatic Eyes: conjunctivae/corneas clear. PERRL, EOM's intact. Fundi  benign. Ears: normal TM's and external ear canals both ears Nose: Nares normal. Septum midline. Mucosa normal. No drainage or sinus tenderness. Throat: lips, mucosa, and tongue normal; teeth and gums normal Neck: no adenopathy, no carotid bruit,  no JVD, supple, symmetrical, trachea midline and thyroid not enlarged, symmetric, no tenderness/mass/nodules Back: symmetric, no curvature. ROM normal. No CVA tenderness. Lungs: clear to auscultation bilaterally Breasts: normal appearance, no masses or tenderness Heart: regular rate and rhythm, S1, S2 normal, no murmur, click, rub or gallop Abdomen: soft, non-tender; bowel sounds normal; no masses,  no organomegaly Pelvic: not indicated; post-menopausal, no abnormal Pap smears in past Extremities: extremities normal, atraumatic, no cyanosis or edema Pulses: 2+ and symmetric Skin: Skin color, texture, turgor normal. No rashes or lesions Lymph nodes: Cervical, supraclavicular, and axillary nodes normal. Neurologic: Alert and oriented X 3, normal strength and tone. Normal symmetric reflexes. Normal coordination and gait Psych-- no depression, no anxiey     Assessment:     cpe     Plan:     During the course of the visit the patient was educated and counseled about appropriate screening and preventive services including:    Pneumococcal vaccine   Influenza vaccine  Screening mammography  Bone densitometry screening  Colorectal cancer screening  Diabetes screening  Glaucoma screening  Nutrition counseling   Advanced directives: has an advanced directive - a copy HAS NOT been provided.  Diet review for nutrition referral? Yes ____  Not Indicated ____x   Patient Instructions (the written plan) was given to the patient.  Medicare Attestation I have personally reviewed: The patient's medical and social history Their use of alcohol, tobacco or illicit drugs Their current medications and supplements The patient's functional  ability including ADLs,fall risks, home safety risks, cognitive, and hearing and visual impairment Diet and physical activities Evidence for depression or mood disorders  The patient's weight, height, BMI, and visual acuity have been recorded in the chart.  I have made referrals, counseling, and provided education to the patient based on review of the above and I have provided the patient with a written personalized care plan for preventive services.     Sharon Freud, DO   09/25/2012

## 2012-09-25 NOTE — Assessment & Plan Note (Signed)
Stable con't meds 

## 2012-09-27 ENCOUNTER — Telehealth: Payer: Self-pay

## 2012-09-27 LAB — URINE CULTURE: Colony Count: >=100000

## 2012-09-27 MED ORDER — CIPROFLOXACIN HCL 250 MG PO TABS: 250.0000 mg | ORAL_TABLET | Freq: Two times a day (BID) | ORAL | Status: AC

## 2012-09-27 NOTE — Telephone Encounter (Signed)
Message copied by Arnette Norris on Wed Sep 27, 2012  5:30 PM ------      Message from: Lelon Perla      Created: Wed Sep 27, 2012  7:34 AM       + UTI   cipro 250 mg 1 po bid for 3 days

## 2012-09-27 NOTE — Telephone Encounter (Signed)
discussed with patient and she voiced understanding. Rx faxed      KP 

## 2012-10-30 ENCOUNTER — Ambulatory Visit (INDEPENDENT_AMBULATORY_CARE_PROVIDER_SITE_OTHER)
Admission: RE | Admit: 2012-10-30 | Discharge: 2012-10-30 | Disposition: A | Discharge status: Home / Self Care | Payer: Medicare Other | Source: Ambulatory Visit

## 2012-10-30 DIAGNOSIS — M8580 Other specified disorders of bone density and structure, unspecified site: Secondary | ICD-10-CM

## 2012-10-30 DIAGNOSIS — M949 Disorder of cartilage, unspecified: Secondary | ICD-10-CM

## 2012-12-18 ENCOUNTER — Other Ambulatory Visit: Payer: Self-pay | Admitting: Family Medicine

## 2012-12-18 DIAGNOSIS — Z1231 Encounter for screening mammogram for malignant neoplasm of breast: Secondary | ICD-10-CM

## 2013-01-19 ENCOUNTER — Ambulatory Visit: Payer: Medicare Other

## 2013-04-17 ENCOUNTER — Ambulatory Visit
Admission: RE | Admit: 2013-04-17 | Discharge: 2013-04-17 | Disposition: A | Discharge status: Home / Self Care | Payer: Medicare Other | Source: Ambulatory Visit | Attending: Family Medicine | Admitting: Family Medicine

## 2013-04-17 DIAGNOSIS — Z1231 Encounter for screening mammogram for malignant neoplasm of breast: Secondary | ICD-10-CM

## 2013-09-21 ENCOUNTER — Other Ambulatory Visit: Payer: Self-pay | Admitting: Family Medicine

## 2013-10-24 ENCOUNTER — Encounter: Payer: Medicare Other | Admitting: Family Medicine

## 2014-04-01 ENCOUNTER — Other Ambulatory Visit: Payer: Self-pay

## 2014-04-01 DIAGNOSIS — Z1231 Encounter for screening mammogram for malignant neoplasm of breast: Secondary | ICD-10-CM

## 2014-04-19 ENCOUNTER — Encounter (INDEPENDENT_AMBULATORY_CARE_PROVIDER_SITE_OTHER): Payer: Self-pay

## 2014-04-19 ENCOUNTER — Ambulatory Visit
Admission: RE | Admit: 2014-04-19 | Discharge: 2014-04-19 | Disposition: A | Discharge status: Home / Self Care | Payer: Medicare Other | Source: Ambulatory Visit

## 2014-04-19 DIAGNOSIS — Z1231 Encounter for screening mammogram for malignant neoplasm of breast: Secondary | ICD-10-CM

## 2015-03-18 ENCOUNTER — Other Ambulatory Visit: Payer: Self-pay

## 2015-03-18 DIAGNOSIS — Z1231 Encounter for screening mammogram for malignant neoplasm of breast: Secondary | ICD-10-CM

## 2015-04-21 ENCOUNTER — Ambulatory Visit
Admission: RE | Admit: 2015-04-21 | Discharge: 2015-04-21 | Disposition: A | Discharge status: Home / Self Care | Payer: Medicare Other | Source: Ambulatory Visit

## 2015-04-21 ENCOUNTER — Ambulatory Visit: Payer: Medicare Other

## 2015-04-21 DIAGNOSIS — Z1231 Encounter for screening mammogram for malignant neoplasm of breast: Secondary | ICD-10-CM

## 2016-03-22 ENCOUNTER — Other Ambulatory Visit: Payer: Self-pay

## 2016-03-22 DIAGNOSIS — Z1231 Encounter for screening mammogram for malignant neoplasm of breast: Secondary | ICD-10-CM

## 2016-04-20 ENCOUNTER — Ambulatory Visit: Payer: Medicare Other

## 2016-04-21 ENCOUNTER — Other Ambulatory Visit: Payer: Self-pay | Admitting: Family Medicine

## 2016-04-21 ENCOUNTER — Ambulatory Visit
Admission: RE | Admit: 2016-04-21 | Discharge: 2016-04-21 | Disposition: A | Discharge status: Home / Self Care | Payer: Medicare Other | Source: Ambulatory Visit

## 2016-04-21 DIAGNOSIS — Z1231 Encounter for screening mammogram for malignant neoplasm of breast: Secondary | ICD-10-CM

## 2017-10-15 ENCOUNTER — Encounter (HOSPITAL_COMMUNITY): Payer: Self-pay | Admitting: Nurse Practitioner

## 2017-10-15 ENCOUNTER — Emergency Department (HOSPITAL_COMMUNITY): Payer: Medicare Other

## 2017-10-15 ENCOUNTER — Other Ambulatory Visit: Payer: Self-pay

## 2017-10-15 ENCOUNTER — Inpatient Hospital Stay (HOSPITAL_COMMUNITY)
Admission: EM | Admit: 2017-10-15 | Discharge: 2017-11-15 | DRG: 683 | Disposition: E | Payer: Medicare Other | Attending: Internal Medicine | Admitting: Internal Medicine

## 2017-10-15 DIAGNOSIS — K7460 Unspecified cirrhosis of liver: Secondary | ICD-10-CM | POA: Diagnosis present

## 2017-10-15 DIAGNOSIS — N179 Acute kidney failure, unspecified: Secondary | ICD-10-CM | POA: Diagnosis present

## 2017-10-15 DIAGNOSIS — E875 Hyperkalemia: Secondary | ICD-10-CM | POA: Diagnosis present

## 2017-10-15 DIAGNOSIS — N2581 Secondary hyperparathyroidism of renal origin: Secondary | ICD-10-CM | POA: Diagnosis present

## 2017-10-15 DIAGNOSIS — E871 Hypo-osmolality and hyponatremia: Secondary | ICD-10-CM | POA: Diagnosis present

## 2017-10-15 DIAGNOSIS — I959 Hypotension, unspecified: Secondary | ICD-10-CM | POA: Diagnosis not present

## 2017-10-15 DIAGNOSIS — J90 Pleural effusion, not elsewhere classified: Secondary | ICD-10-CM

## 2017-10-15 DIAGNOSIS — N131 Hydronephrosis with ureteral stricture, not elsewhere classified: Secondary | ICD-10-CM | POA: Diagnosis present

## 2017-10-15 DIAGNOSIS — N19 Unspecified kidney failure: Secondary | ICD-10-CM | POA: Diagnosis not present

## 2017-10-15 DIAGNOSIS — H9190 Unspecified hearing loss, unspecified ear: Secondary | ICD-10-CM | POA: Diagnosis present

## 2017-10-15 DIAGNOSIS — R310 Gross hematuria: Secondary | ICD-10-CM | POA: Diagnosis present

## 2017-10-15 DIAGNOSIS — I351 Nonrheumatic aortic (valve) insufficiency: Secondary | ICD-10-CM | POA: Diagnosis not present

## 2017-10-15 DIAGNOSIS — E872 Acidosis, unspecified: Secondary | ICD-10-CM | POA: Diagnosis present

## 2017-10-15 DIAGNOSIS — Z7189 Other specified counseling: Secondary | ICD-10-CM | POA: Diagnosis not present

## 2017-10-15 DIAGNOSIS — Z79899 Other long term (current) drug therapy: Secondary | ICD-10-CM

## 2017-10-15 DIAGNOSIS — E039 Hypothyroidism, unspecified: Secondary | ICD-10-CM | POA: Diagnosis present

## 2017-10-15 DIAGNOSIS — M8580 Other specified disorders of bone density and structure, unspecified site: Secondary | ICD-10-CM | POA: Diagnosis present

## 2017-10-15 DIAGNOSIS — N139 Obstructive and reflux uropathy, unspecified: Secondary | ICD-10-CM | POA: Diagnosis present

## 2017-10-15 DIAGNOSIS — E878 Other disorders of electrolyte and fluid balance, not elsewhere classified: Secondary | ICD-10-CM | POA: Diagnosis not present

## 2017-10-15 DIAGNOSIS — Z7983 Long term (current) use of bisphosphonates: Secondary | ICD-10-CM

## 2017-10-15 DIAGNOSIS — C787 Secondary malignant neoplasm of liver and intrahepatic bile duct: Secondary | ICD-10-CM | POA: Diagnosis present

## 2017-10-15 DIAGNOSIS — Z515 Encounter for palliative care: Secondary | ICD-10-CM | POA: Diagnosis present

## 2017-10-15 DIAGNOSIS — N1330 Unspecified hydronephrosis: Secondary | ICD-10-CM

## 2017-10-15 DIAGNOSIS — D509 Iron deficiency anemia, unspecified: Secondary | ICD-10-CM | POA: Diagnosis present

## 2017-10-15 DIAGNOSIS — I10 Essential (primary) hypertension: Secondary | ICD-10-CM | POA: Diagnosis not present

## 2017-10-15 DIAGNOSIS — I110 Hypertensive heart disease with heart failure: Secondary | ICD-10-CM | POA: Diagnosis present

## 2017-10-15 DIAGNOSIS — I509 Heart failure, unspecified: Secondary | ICD-10-CM | POA: Diagnosis present

## 2017-10-15 DIAGNOSIS — I480 Paroxysmal atrial fibrillation: Secondary | ICD-10-CM | POA: Diagnosis not present

## 2017-10-15 DIAGNOSIS — R748 Abnormal levels of other serum enzymes: Secondary | ICD-10-CM | POA: Diagnosis present

## 2017-10-15 DIAGNOSIS — D508 Other iron deficiency anemias: Secondary | ICD-10-CM | POA: Diagnosis not present

## 2017-10-15 DIAGNOSIS — Z66 Do not resuscitate: Secondary | ICD-10-CM | POA: Diagnosis present

## 2017-10-15 DIAGNOSIS — C7951 Secondary malignant neoplasm of bone: Secondary | ICD-10-CM | POA: Diagnosis present

## 2017-10-15 DIAGNOSIS — K921 Melena: Secondary | ICD-10-CM | POA: Diagnosis present

## 2017-10-15 DIAGNOSIS — R740 Nonspecific elevation of levels of transaminase and lactic acid dehydrogenase [LDH]: Secondary | ICD-10-CM | POA: Diagnosis present

## 2017-10-15 DIAGNOSIS — I4891 Unspecified atrial fibrillation: Secondary | ICD-10-CM | POA: Diagnosis not present

## 2017-10-15 DIAGNOSIS — K7290 Hepatic failure, unspecified without coma: Secondary | ICD-10-CM | POA: Diagnosis present

## 2017-10-15 DIAGNOSIS — C689 Malignant neoplasm of urinary organ, unspecified: Secondary | ICD-10-CM | POA: Diagnosis present

## 2017-10-15 DIAGNOSIS — C7910 Secondary malignant neoplasm of unspecified urinary organs: Secondary | ICD-10-CM

## 2017-10-15 DIAGNOSIS — E8729 Other acidosis: Secondary | ICD-10-CM

## 2017-10-15 DIAGNOSIS — Z887 Allergy status to serum and vaccine status: Secondary | ICD-10-CM

## 2017-10-15 DIAGNOSIS — Z79890 Hormone replacement therapy (postmenopausal): Secondary | ICD-10-CM

## 2017-10-15 HISTORY — DX: Non-Hodgkin lymphoma, unspecified, unspecified site: C85.90

## 2017-10-15 HISTORY — DX: Malignant neoplasm of unspecified kidney, except renal pelvis: C64.9

## 2017-10-15 HISTORY — DX: Heart failure, unspecified: I50.9

## 2017-10-15 HISTORY — DX: Disorder of kidney and ureter, unspecified: N28.9

## 2017-10-15 LAB — CBC
HEMATOCRIT: 27.7 % — AB (ref 36.0–46.0)
HEMOGLOBIN: 9.9 g/dL — AB (ref 12.0–15.0)
MCH: 27.4 pg (ref 26.0–34.0)
MCHC: 35.7 g/dL (ref 30.0–36.0)
MCV: 76.7 fL — AB (ref 78.0–100.0)
Platelets: 253 10*3/uL (ref 150–400)
RBC: 3.61 MIL/uL — AB (ref 3.87–5.11)
RDW: 15 % (ref 11.5–15.5)
WBC: 13.7 10*3/uL — AB (ref 4.0–10.5)

## 2017-10-15 LAB — BASIC METABOLIC PANEL
Anion gap: 19 — ABNORMAL HIGH (ref 5–15)
BUN: 95 mg/dL — ABNORMAL HIGH (ref 6–20)
CHLORIDE: 84 mmol/L — AB (ref 101–111)
CO2: 16 mmol/L — ABNORMAL LOW (ref 22–32)
Calcium: 6.2 mg/dL — CL (ref 8.9–10.3)
Creatinine, Ser: 8.39 mg/dL — ABNORMAL HIGH (ref 0.44–1.00)
GFR calc non Af Amer: 4 mL/min — ABNORMAL LOW (ref >60)
GFR, EST AFRICAN AMERICAN: 4 mL/min — AB (ref >60)
Glucose, Bld: 125 mg/dL — ABNORMAL HIGH (ref 65–99)
POTASSIUM: 6 mmol/L — AB (ref 3.5–5.1)
Sodium: 119 mmol/L — CL (ref 135–145)

## 2017-10-15 LAB — BLOOD GAS, VENOUS
Acid-base deficit: 6.8 mmol/L — ABNORMAL HIGH (ref 0.0–2.0)
Bicarbonate: 16.3 mmol/L — ABNORMAL LOW (ref 20.0–28.0)
FIO2: 21
O2 SAT: 75.3 %
PCO2 VEN: 26.4 mmHg — AB (ref 44.0–60.0)
PH VEN: 7.406 (ref 7.250–7.430)
Patient temperature: 98.6
pO2, Ven: 46 mmHg — ABNORMAL HIGH (ref 32.0–45.0)

## 2017-10-15 MED ORDER — HYDRALAZINE HCL 20 MG/ML IJ SOLN: 5.0000 mg | INTRAMUSCULAR | Status: DC | PRN

## 2017-10-15 MED ORDER — SODIUM CHLORIDE 0.9 % IV SOLN
Freq: Once | INTRAVENOUS | Status: AC
  Administered 2017-10-15: 22:00:00 via INTRAVENOUS

## 2017-10-15 MED ORDER — SODIUM POLYSTYRENE SULFONATE 15 GM/60ML PO SUSP
45.0000 g | Freq: Once | ORAL | Status: AC
  Administered 2017-10-16: 45 g via ORAL
  Filled 2017-10-15: qty 180

## 2017-10-15 MED ORDER — ZOLPIDEM TARTRATE 5 MG PO TABS: 5.0000 mg | ORAL_TABLET | Freq: Every evening | ORAL | Status: DC | PRN

## 2017-10-15 MED ORDER — SODIUM CHLORIDE 0.9 % IV SOLN
INTRAVENOUS | Status: DC
  Administered 2017-10-16: via INTRAVENOUS

## 2017-10-15 MED ORDER — ACETAMINOPHEN 325 MG PO TABS
650.0000 mg | ORAL_TABLET | Freq: Four times a day (QID) | ORAL | Status: DC | PRN
  Administered 2017-10-22 – 2017-10-28 (×7): 650 mg via ORAL
  Filled 2017-10-15 (×7): qty 2

## 2017-10-15 MED ORDER — ONDANSETRON HCL 4 MG/2ML IJ SOLN
4.0000 mg | Freq: Three times a day (TID) | INTRAMUSCULAR | Status: DC | PRN
  Administered 2017-10-27 – 2017-10-28 (×3): 4 mg via INTRAVENOUS
  Filled 2017-10-15 (×3): qty 2

## 2017-10-15 MED ORDER — INSULIN ASPART 100 UNIT/ML IV SOLN
10.0000 [IU] | Freq: Once | INTRAVENOUS | Status: AC
  Administered 2017-10-15: 10 [IU] via INTRAVENOUS
  Filled 2017-10-15: qty 0.1

## 2017-10-15 MED ORDER — CALCIUM GLUCONATE 10 % IV SOLN
1.0000 g | Freq: Once | INTRAVENOUS | Status: AC
  Administered 2017-10-15: 1 g via INTRAVENOUS
  Filled 2017-10-15: qty 10

## 2017-10-15 MED ORDER — ALBUTEROL SULFATE (2.5 MG/3ML) 0.083% IN NEBU
10.0000 mg | INHALATION_SOLUTION | Freq: Once | RESPIRATORY_TRACT | Status: AC
  Administered 2017-10-15: 10 mg via RESPIRATORY_TRACT
  Filled 2017-10-15: qty 12

## 2017-10-15 MED ORDER — DEXTROSE 50 % IV SOLN
1.0000 | Freq: Once | INTRAVENOUS | Status: AC
  Administered 2017-10-15: 50 mL via INTRAVENOUS
  Filled 2017-10-15: qty 50

## 2017-10-15 NOTE — ED Notes (Signed)
Ultrasound at bedside

## 2017-10-15 NOTE — ED Provider Notes (Signed)
[place] DEPT Provider Note   CSN: [id number] Arrival date & time: 10/30/2017  1632     History   Chief Complaint Chief Complaint  Patient presents with  . Urinary Retention  . Weakness  . Cancer Patient    HPI Sharon [name] is a 83 y.o. female Bib her sons for 24 hours without urination. She was brought to [place] by her sons after recent admission at [facility] after admission for acute renal failure . She was found to have obstructive uropathy form urothelial cancer. She had BL renal stents placed. Her cancer was found to be metastatic to the liver, lumbar vertebrae and sacrum. The patient liver biopsy, multiple CT scans and Echo cardiogram- All records are available through Care everywhere.  Her discharge labs showed  Are listed below. Her sons states that she has been lethargic, without appetite, and has not made any urine for 24 hours. She denies pain or fevers.  She  Is found to again be in acute renal failure with marked electrolyte abnormalities including hyperkalemia with T wave changes.   "NA 126 (L) 10/07/2017  K 3.2 (L) 10/07/2017  CL 90 (L) 10/07/2017  CO2 24 10/07/2017  AGAP 12 10/07/2017  CREATININE 1.22 (H) 10/07/2017  GFR 42 10/07/2017  BUN 23 (H) 10/07/2017  GLUC 93 10/07/2017  CALCIUM 8.3 (L) 10/07/2017  ALB 1.8 (L) 10/05/2017  TP 5.6 (L) 10/05/2017  TBIL 0.5 10/05/2017  ALT 100 (H) 10/05/2017  AST 200 (H) 10/05/2017  ALK 423 (H) 10/05/2017 "     HPI  Past Medical History:  Diagnosis Date  . Hypertension   . Kidney cancer, primary, with metastasis from kidney to other site [facility])   . Osteopenia   . Thyroid disease     Patient Active Problem List   Diagnosis Date Noted  . Iron deficiency anemia 10/30/2017  . Uremia 10/19/2017  . Hyponatremia 10/29/2017  . Hyperkalemia 10/23/2017  . Metabolic acidosis 98/33/8250  . Hypocalcemia 11/10/2017  . Essential hypertension 08/19/2008  . LYMPHOMA 12/26/2006  .  Hypothyroidism 12/26/2006  . OSTEOPENIA 12/26/2006    Past Surgical History:  Procedure Laterality Date  . ABDOMINAL HYSTERECTOMY    . renal stents    . TONSILLECTOMY      OB History    No data available       Home Medications    Prior to Admission medications   Medication Sig Start Date End Date Taking? Authorizing Provider  Calcium Carbonate-Vitamin D (CALCIUM + D) 600-200 MG-UNIT TABS Take 2 tablets by mouth daily.     Yes [provider]  cholecalciferol (VITAMIN D) 1000 UNITS tablet Take 1,000 Units by mouth daily.     Yes [provider]  cyclobenzaprine (FLEXERIL) 5 MG tablet Take 5 mg by mouth 3 (three) times daily as needed for muscle spasms.   Yes [provider]  ferrous gluconate (FERGON) 325 MG tablet Take 325 mg by mouth daily with breakfast.     Yes [provider]  Flaxseed, Linseed, (FLAX SEED OIL) 1000 MG CAPS Take 1 capsule by mouth daily.     Yes [provider]  furosemide (LASIX) 40 MG tablet Take 40 mg by mouth daily.   Yes [provider]  levothyroxine (SYNTHROID, LEVOTHROID) 25 MCG tablet 1 po qd 09/25/12  Yes Roma Schanz R, DO  metoprolol tartrate (LOPRESSOR) 25 MG tablet Take 25 mg by mouth 2 (two) times daily.   Yes [provider]  Multiple Vitamin (MULTIVITAMIN) tablet Take 1 tablet by mouth daily.     Yes [provider]  vitamin B-12 (CYANOCOBALAMIN) 1000 MCG tablet Take 1,000 mcg by mouth daily.     Yes [provider]  zoster vaccine live, PF, (ZOSTAVAX) 54492 UNT/0.65ML injection Inject 19,400 Units into the skin once. 09/25/12  Yes Roma Schanz R, DO  alendronate (FOSAMAX) 70 MG tablet Take with a full glass of water on an empty stomach. Patient not taking: Reported on 11/09/2017 09/25/12   Carollee Herter, Alferd Apa, DO  ciprofloxacin (CIPRO) 250 MG tablet Take 1 tablet (250 mg total) by mouth 2 (two) times daily. Patient not taking: Reported on 11/01/2017  09/27/12   Carollee Herter, Alferd Apa, DO  lisinopril-hydrochlorothiazide (PRINZIDE,ZESTORETIC) 10-12.5 MG per tablet 1 tab by mouth daily--Office visit due now Patient not taking: Reported on 10/18/2017 09/21/13   Ann Held, DO    Family History Family History  Problem Relation Age of Onset  . Dementia Unknown   . COPD Unknown   . Hip fracture Unknown     Social History Social History   Tobacco Use  . Smoking status: Never Smoker  . Smokeless tobacco: Never Used  Substance Use Topics  . Alcohol use: No  . Drug use: No     Allergies   Patient has no known allergies.   Review of Systems Review of Systems  Ten systems reviewed and are negative for acute change, except as noted in the HPI.   Physical Exam Updated Vital Signs BP 110/60   Pulse 76   Temp 98.2 F (36.8 C) (Oral)   Resp 18   Wt 62.6 kg (138 lb)   SpO2 96%   BMI 25.24 kg/m   Physical Exam  Constitutional: She is oriented to person, place, and time. She appears well-developed and well-nourished. She has a sickly appearance. No distress.  HENT:  Head: Normocephalic and atraumatic.  Eyes: Conjunctivae and EOM are normal. Pupils are equal, round, and reactive to light. No scleral icterus.  Neck: Normal range of motion.  Cardiovascular: Normal rate, regular rhythm and normal heart sounds. Exam reveals no gallop and no friction rub.  No murmur heard. Pulmonary/Chest: Effort normal and breath sounds normal. No respiratory distress.  Abdominal: Soft. Bowel sounds are normal. She exhibits fluid wave. She exhibits no distension and no mass. There is no tenderness. There is no guarding and no CVA tenderness.  Neurological: She is alert and oriented to person, place, and time.  Skin: Skin is warm and dry. She is not diaphoretic.  Psychiatric: Her behavior is normal.  Nursing note and vitals reviewed.    ED Treatments / Results  Labs (all labs ordered are listed, but only abnormal results are  displayed) Labs Reviewed  BASIC METABOLIC PANEL - Abnormal; Notable for the following components:      Result Value   Sodium 119 (*)    Potassium 6.0 (*)    Chloride 84 (*)    CO2 16 (*)    Glucose, Bld 125 (*)    BUN 95 (*)    Creatinine, Ser 8.39 (*)    Calcium 6.2 (*)    GFR calc non Af Amer 4 (*)    GFR calc Af Amer 4 (*)    Anion gap 19 (*)    All other components within normal limits  CBC - Abnormal; Notable for the following components:   WBC 13.7 (*)    RBC 3.61 (*)  Hemoglobin 9.9 (*)    HCT 27.7 (*)    MCV 76.7 (*)    All other components within normal limits  BLOOD GAS, VENOUS - Abnormal; Notable for the following components:   pCO2, Ven 26.4 (*)    pO2, Ven 46.0 (*)    Bicarbonate 16.3 (*)    Acid-base deficit 6.8 (*)    All other components within normal limits  URINE CULTURE  URINALYSIS, ROUTINE W REFLEX MICROSCOPIC  BASIC METABOLIC PANEL    EKG  EKG Interpretation  Date/Time:  Saturday October 15 2017 19:15:29 EST Ventricular Rate:  65 PR Interval:    QRS Duration: 101 QT Interval:  435 QTC Calculation: 453 R Axis:   36 Text Interpretation:  Sinus rhythm Low voltage, extremity leads Baseline wander in lead(s) V1 No prior ECG for comparison.  Possible peaked t waves in lead 2.  No STEMI Confirmed by Antony Blackbird [id number]) on 11/12/2017 7:24:47 PM       Radiology US Renal  Result Date: 10/19/2017 CLINICAL DATA:  Renal failure. EXAM: RENAL / URINARY TRACT ULTRASOUND COMPLETE COMPARISON:  None. FINDINGS: Right Kidney: Length: 13.2 cm. Rounded regions of fluid in the left kidney are favored to represent caliectasis. Renal cysts considered less likely. No pelviectasis. No solid mass noted. Left Kidney: Length: 11.5 cm. The left kidney is not well visualized or characterized. Bladder: Decompressed. IMPRESSION: 1. Probable caliectasis in the right kidney. Renal cysts are considered less likely. No gross hydronephrosis with no pelviectasis. 2. The left  kidney is not well visualized. 3. The bladder is poorly evaluated due to lack of distention. 4. A small amount of free fluid is seen in the right upper quadrant and pelvis, nonspecific. Electronically Signed   By: Dorise Bullion III M.D   On: 10/28/2017 21:06    Procedures .Critical Care Performed by: Margarita Mail, PA-C Authorized by: Margarita Mail, PA-C   Critical care provider statement:    Critical care time (minutes):  60   Critical care was necessary to treat or prevent imminent or life-threatening deterioration of the following conditions:  Renal failure and metabolic crisis   Critical care was time spent personally by me on the following activities:  Re-evaluation of patient's condition, review of old charts, ordering and review of radiographic studies, ordering and review of laboratory studies, ordering and performing treatments and interventions, development of treatment plan with patient or surrogate, discussions with consultants, examination of patient and interpretation of cardiac output measurements   (including critical care time)  Medications Ordered in ED Medications  sodium polystyrene (KAYEXALATE) 15 GM/60ML suspension 45 g (not administered)  ondansetron (ZOFRAN) injection 4 mg (not administered)  hydrALAZINE (APRESOLINE) injection 5 mg (not administered)  acetaminophen (TYLENOL) tablet 650 mg (not administered)  zolpidem (AMBIEN) tablet 5 mg (not administered)  0.9 %  sodium chloride infusion (not administered)  calcium gluconate 1 g in sodium chloride 0.9 % 100 mL IVPB (0 g Intravenous Stopped 10/24/2017 2050)  albuterol (PROVENTIL) (2.5 MG/3ML) 0.083% nebulizer solution 10 mg (10 mg Nebulization Given 11/03/2017 2007)  dextrose 50 % solution 50 mL (50 mLs Intravenous Given 11/07/2017 2030)  insulin aspart (novoLOG) injection 10 Units (10 Units Intravenous Given 11/03/2017 2030)  0.9 %  sodium chloride infusion ( Intravenous New Bag/Given 11/09/2017 2130)     Initial  Impression / Assessment and Plan / ED Course  I have reviewed the triage vital signs and the nursing notes.  Pertinent labs & imaging results that were available during my care  of the patient were reviewed by me and considered in my medical decision making (see chart for details).  Clinical Course as of Oct 15 2356  Sat Oct 15, 2017  1937 I spoke with Dr. Jonnie Finner who recommends fluids, Renal Ultrasound and kayexalate. Call int the morning.  [AH]    Clinical Course User Index [AH] Margarita Mail, PA-C    Patient in acute renal failure.  We have given her temporizing agents for her hyperkalemia.  The patient has had a renal ultrasound that shows no hydronephrosis or obstructive uropathy.  I discussed the case with Dr. Blaine Hamper.  Is the patient does not appear to have a urologic cause of her acute renal failure feel she may need to be transferred to [facility] as she might need dialysis given her hyperkalemia and renal failure.  Final Clinical Impressions(s) / ED Diagnoses   Final diagnoses:  Acute renal failure, unspecified acute renal failure type (HCC)  Electrolyte abnormality  Hyponatremia  Hyperkalemia  Metastatic urothelial carcinoma (HCC)  Hypocalcemia  Metabolic acidosis, increased anion gap    ED Discharge Orders    None       Margarita Mail, PA-C 10/22/2017 2357    Tegeler, Gwenyth Allegra, MD 10/16/17 1059

## 2017-10-15 NOTE — H&P (Signed)
History and Physical    Sharon [name] PIR:[id number] DOB: 05-Nov-1932 DOA: 11/06/2017  Referring MD/NP/PA:   PCP: Ann Held, DO   Patient coming from:  The patient is coming from home.  At baseline, pt is partially dependent for most of ADL.   Chief Complaint: Decreased urine output  HPI: Sharon [name] is a 83 y.o. female with medical history significant of hypertension, hypothyroidism, iron deficiency anemia, newly diagnosed metastatic urothelial cancer, s/p of bilateral ureter stent placement due to obstruction, who presents with decreased urine output.  Per patient's son, patient was recently hospitalized to [facility], found to have metastasized urothelial cancer (to liver and lumbar spine). Treatment has not started it yet. Patient had bilateral ureter stent placement due to obstruction. Patient was discharged home one week ago. Today patient was found to have decreased urine output, she only had a few drops of urine today per her son. Patient denies symptoms of UTI. No nausea, vomiting, diarrhea or abdominal pain. She has generalized weakness. She has mild dry cough, but no SOB or chest pain. She has bilateral leg edema, which has worsened today.  ED Course: pt was found to have uremia with potassium 6.0, bicarbonate 16, creatinine 8.39, BUN 95, sodium 119, WBC 13.7, pending urinalysis, renal ultrasound did not show hydronephrosis, temperature normal, no tachycardia, O2 sat 95% on room air. Patient is admitted to telemetry bed as inpatient. ED physician consulted nephrologist, Dr. Jonnie Finner.  Review of Systems:   General: no fevers, chills, has poor appetite, has fatigue HEENT: no blurry vision, hearing changes or sore throat Respiratory: no dyspnea, coughing, wheezing CV: no chest pain, no palpitations GI: no nausea, vomiting, abdominal pain, diarrhea, constipation. Has decreased urine output . GU: no dysuria, burning on urination, increased urinary  frequency, hematuria  Ext: has leg edema Neuro: no unilateral weakness, numbness, or tingling, no vision change or hearing loss Skin: no rash, no skin tear. MSK: No muscle spasm, no deformity, no limitation of range of movement in spin Heme: No easy bruising.  Travel history: No recent long distant travel.  Allergy: No Known Allergies  Past Medical History:  Diagnosis Date  . Hypertension   . Kidney cancer, primary, with metastasis from kidney to other site [facility])   . Osteopenia   . Thyroid disease     Past Surgical History:  Procedure Laterality Date  . ABDOMINAL HYSTERECTOMY    . renal stents    . TONSILLECTOMY      Social History:  reports that  has never smoked. she has never used smokeless tobacco. She reports that she does not drink alcohol or use drugs.  Family History:  Family History  Problem Relation Age of Onset  . Dementia Unknown   . COPD Unknown   . Hip fracture Unknown      Prior to Admission medications   Medication Sig Start Date End Date Taking? Authorizing Provider  Calcium Carbonate-Vitamin D (CALCIUM + D) 600-200 MG-UNIT TABS Take 2 tablets by mouth daily.     Yes [provider]  cholecalciferol (VITAMIN D) 1000 UNITS tablet Take 1,000 Units by mouth daily.     Yes [provider]  cyclobenzaprine (FLEXERIL) 5 MG tablet Take 5 mg by mouth 3 (three) times daily as needed for muscle spasms.   Yes [provider]  ferrous gluconate (FERGON) 325 MG tablet Take 325 mg by mouth daily with breakfast.     Yes [provider]  Flaxseed, Linseed, (FLAX  SEED OIL) 1000 MG CAPS Take 1 capsule by mouth daily.     Yes [provider]  furosemide (LASIX) 40 MG tablet Take 40 mg by mouth daily.   Yes [provider]  levothyroxine (SYNTHROID, LEVOTHROID) 25 MCG tablet 1 po qd 09/25/12  Yes Roma Schanz R, DO  metoprolol tartrate (LOPRESSOR) 25 MG tablet Take 25 mg by mouth 2 (two) times daily.   Yes [provider]  Multiple Vitamin (MULTIVITAMIN) tablet Take 1 tablet by mouth daily.     Yes [provider]  vitamin B-12 (CYANOCOBALAMIN) 1000 MCG tablet Take 1,000 mcg by mouth daily.     Yes [provider]  zoster vaccine live, PF, (ZOSTAVAX) 29518 UNT/0.65ML injection Inject 19,400 Units into the skin once. 09/25/12  Yes Roma Schanz R, DO  alendronate (FOSAMAX) 70 MG tablet Take with a full glass of water on an empty stomach. Patient not taking: Reported on 10/22/2017 09/25/12   Carollee Herter, Alferd Apa, DO  ciprofloxacin (CIPRO) 250 MG tablet Take 1 tablet (250 mg total) by mouth 2 (two) times daily. Patient not taking: Reported on 11/07/2017 09/27/12   Carollee Herter, Alferd Apa, DO  lisinopril-hydrochlorothiazide (PRINZIDE,ZESTORETIC) 10-12.5 MG per tablet 1 tab by mouth daily--Office visit due now Patient not taking: Reported on 10/27/2017 09/21/13   Ann Held, DO    Physical Exam: Vitals:   11/01/2017 2345 10/16/17 0000 10/16/17 0152 10/16/17 0300  BP:  119/66 (!) 142/52   Pulse: 69 69 73   Resp: 19 17 18    Temp:   98.3 F (36.8 C)   TempSrc:   Oral   SpO2: 94% 94% 95%   Weight:    68.4 kg (150 lb 12.7 oz)  Height:    5\' 3"  (1.6 m)   General: Not in acute distress HEENT:       Eyes: PERRL, EOMI, no scleral icterus.       ENT: No discharge from the ears and nose, no pharynx injection, no tonsillar enlargement.        Neck: No JVD, no bruit, no mass felt. Heme: No neck lymph node enlargement. Cardiac: S1/S2, RRR, No murmurs, No gallops or rubs. Respiratory:  No rales, wheezing, rhonchi or rubs. GI: Soft, nondistended, nontender, no rebound pain, no organomegaly, BS present. GU: No hematuria Ext: 3+ pitting leg edema bilaterally. 2+DP/PT pulse bilaterally. Musculoskeletal: No joint deformities, No joint redness or warmth, no limitation of ROM in spin. Skin: No rashes.  Neuro: Alert, oriented X3, cranial nerves II-XII grossly intact, moves all  extremities normally. Psych: Patient is not psychotic, no suicidal or hemocidal ideation.  Labs on Admission: I have personally reviewed following labs and imaging studies  CBC: Recent Labs  Lab 10/21/2017 1758 10/16/17 0445  WBC 13.7* 11.7*  HGB 9.9* 9.0*  HCT 27.7* 26.3*  MCV 76.7* 77.6*  PLT 253 841   Basic Metabolic Panel: Recent Labs  Lab 10/21/2017 1758 10/16/17 0005  NA 119* 118*  K 6.0* 5.7*  CL 84* 84*  CO2 16* 15*  GLUCOSE 125* 94  BUN 95* 96*  CREATININE 8.39* 8.67*  CALCIUM 6.2* 6.0*   GFR: Estimated Creatinine Clearance: 4.6 mL/min (A) (by C-G formula based on SCr of 8.67 mg/dL (H)). Liver Function Tests: No results for input(s): AST, ALT, ALKPHOS, BILITOT, PROT, ALBUMIN in the last 168 hours. No results for input(s): LIPASE, AMYLASE in the last 168 hours. No results for input(s): AMMONIA in the last 168 hours.  Coagulation Profile: No results for input(s): INR, PROTIME in the last 168 hours. Cardiac Enzymes: No results for input(s): CKTOTAL, CKMB, CKMBINDEX, TROPONINI in the last 168 hours. BNP (last 3 results) No results for input(s): PROBNP in the last 8760 hours. HbA1C: No results for input(s): HGBA1C in the last 72 hours. CBG: No results for input(s): GLUCAP in the last 168 hours. Lipid Profile: No results for input(s): CHOL, HDL, LDLCALC, TRIG, CHOLHDL, LDLDIRECT in the last 72 hours. Thyroid Function Tests: No results for input(s): TSH, T4TOTAL, FREET4, T3FREE, THYROIDAB in the last 72 hours. Anemia Panel: No results for input(s): VITAMINB12, FOLATE, FERRITIN, TIBC, IRON, RETICCTPCT in the last 72 hours. Urine analysis:    Component Value Date/Time   COLORURINE yellow 08/24/2010 0824   APPEARANCEUR Clear 08/24/2010 0824   LABSPEC <1.005 08/24/2010 0824   PHURINE 7.5 08/24/2010 0824   HGBUR negative 08/24/2010 0824   BILIRUBINUR Neg 09/25/2012 1453   PROTEINUR Neg 09/25/2012 1453   UROBILINOGEN 0.2 09/25/2012 1453   UROBILINOGEN 0.2  08/24/2010 0824   NITRITE Neg 09/25/2012 1453   NITRITE negative 08/24/2010 0824   LEUKOCYTESUR moderate (2+) 09/25/2012 1453   Sepsis Labs: @LABRCNTIP (procalcitonin:4,lacticidven:4) )No results found for this or any previous visit (from the past 240 hour(s)).   Radiological Exams on Admission: US Renal  Result Date: 10/21/2017 CLINICAL DATA:  Renal failure. EXAM: RENAL / URINARY TRACT ULTRASOUND COMPLETE COMPARISON:  None. FINDINGS: Right Kidney: Length: 13.2 cm. Rounded regions of fluid in the left kidney are favored to represent caliectasis. Renal cysts considered less likely. No pelviectasis. No solid mass noted. Left Kidney: Length: 11.5 cm. The left kidney is not well visualized or characterized. Bladder: Decompressed. IMPRESSION: 1. Probable caliectasis in the right kidney. Renal cysts are considered less likely. No gross hydronephrosis with no pelviectasis. 2. The left kidney is not well visualized. 3. The bladder is poorly evaluated due to lack of distention. 4. A small amount of free fluid is seen in the right upper quadrant and pelvis, nonspecific. Electronically Signed   By: Dorise Bullion III M.D   On: 10/23/2017 21:06     EKG: Independently reviewed.  Sinus rhythm, QTC 453, low voltage, no T-wave peaking.  Assessment/Plan Principal Problem:   Uremia Active Problems:   Hypothyroidism   Essential hypertension   Iron deficiency anemia   Hyponatremia   Hyperkalemia   Metabolic acidosis   Hypocalcemia   Urothelial cancer ([place])   Uremia: renal US showed probable caliectasis in the right kidney, but no gross hydronephrosis with no pelviectasis. Pt is s/p of bilateral stent placement. Dr. Jonnie Finner of renal was consulted by EDP. I also left a message to renal box for consultation.  -will admit to tele bed as inpt. -f/u renal recommendations. patient may need a dialysis  Hyponatremia: Sodium 119. Mental status okay. Due to uremia, cannot treat patient with IV fluid  quickly -Normal saline at 50 mL per hour -Hold Prinzide and Lasix  Hypothyroidism: Last TSH was 1.49 on 09/25/12 -Continue home Synthroid  HTN: Blood pressure 123/56 -Continue home medications: Metoprolol -Hold Prinzide and Lasix due to uremia and hyponatremia -IV hydralazine prn  Iron deficiency anemia: -Continue to iron supplement  Hyperkalemia: K=6.0. No T peaking on EKG -Kayexalate 45 g 1 -Patient was treated with calcium gluconate, D50 and Lovenox in ED  Metabolic acidosis: Due to uremia -IV fluid as above  Hypocalcemia: Calcium 6.2 -Give 2 g of calcium Gluconate -Start calcitriol and PhosLo -Check phosphorus and magnesium level  Urothelial cancer (  [place]): Newly diagnosed recently. Treatment and not started yet. S/p of bilateral ureteral stent placement. -please consult to oncology.   DVT ppx: SQ Heparin      Code Status: Full code Family Communication:  Yes, patient's two sons  at bed side Disposition Plan:  Anticipate discharge back to previous home environment Consults called:  Renal, dr. Jonnie Finner Admission status:  Inpatient/tele      Date of Service 10/16/2017    Ivor Costa [facility] Pager [phone]  If 7PM-7AM, please contact night-coverage [email or web address] Password [facility] 10/16/2017, 5:26 AM

## 2017-10-15 NOTE — ED Notes (Signed)
Date and time results received: 10/17/2017 6:58 PM  (use smartphrase ".now" to insert current time)  Test: Na Critical Value: 119  Test: Ca Critical Value: 6.2  Name of Provider Notified: Vernie Shanks, PA  Orders Received? Or Actions Taken?: Receptionist notified PA Abigail

## 2017-10-15 NOTE — ED Triage Notes (Signed)
Patient c/o urinary retention per the sons. Per family patient has not urinated in over 24 hours. She recently was discharged from the hospital [facility]) due to urinary retention. Per sons during the admission they were told she had cancer in kidneys that have metastasize to multiple areas. She also had bilateral stents placed due to the retention. Sons reported wanted to bring her back home so that she could began seeing the oncologist here vs staying in the hospital there. Patient is A&O x4. She also states she is having some hemorrhoid concerns also.

## 2017-10-15 NOTE — ED Notes (Signed)
Bed: WA08 Expected date:  Expected time:  Means of arrival:  Comments: Triage 3

## 2017-10-16 ENCOUNTER — Other Ambulatory Visit: Payer: Self-pay

## 2017-10-16 ENCOUNTER — Encounter (HOSPITAL_COMMUNITY): Payer: Self-pay | Admitting: *Deleted

## 2017-10-16 ENCOUNTER — Inpatient Hospital Stay (HOSPITAL_COMMUNITY): Payer: Medicare Other

## 2017-10-16 DIAGNOSIS — D508 Other iron deficiency anemias: Secondary | ICD-10-CM

## 2017-10-16 DIAGNOSIS — C7910 Secondary malignant neoplasm of unspecified urinary organs: Secondary | ICD-10-CM | POA: Insufficient documentation

## 2017-10-16 DIAGNOSIS — E878 Other disorders of electrolyte and fluid balance, not elsewhere classified: Secondary | ICD-10-CM

## 2017-10-16 DIAGNOSIS — C689 Malignant neoplasm of urinary organ, unspecified: Secondary | ICD-10-CM | POA: Diagnosis present

## 2017-10-16 LAB — CBC
HEMATOCRIT: 26.3 % — AB (ref 36.0–46.0)
HEMOGLOBIN: 9 g/dL — AB (ref 12.0–15.0)
MCH: 26.5 pg (ref 26.0–34.0)
MCHC: 34.2 g/dL (ref 30.0–36.0)
MCV: 77.6 fL — AB (ref 78.0–100.0)
Platelets: 265 10*3/uL (ref 150–400)
RBC: 3.39 MIL/uL — AB (ref 3.87–5.11)
RDW: 15.3 % (ref 11.5–15.5)
WBC: 11.7 10*3/uL — ABNORMAL HIGH (ref 4.0–10.5)

## 2017-10-16 LAB — BASIC METABOLIC PANEL
ANION GAP: 17 — AB (ref 5–15)
Anion gap: 19 — ABNORMAL HIGH (ref 5–15)
BUN: 96 mg/dL — ABNORMAL HIGH (ref 6–20)
BUN: 94 mg/dL — ABNORMAL HIGH (ref 6–20)
CALCIUM: 6.3 mg/dL — AB (ref 8.9–10.3)
CHLORIDE: 86 mmol/L — AB (ref 101–111)
CO2: 15 mmol/L — ABNORMAL LOW (ref 22–32)
CO2: 16 mmol/L — AB (ref 22–32)
Calcium: 6 mg/dL — CL (ref 8.9–10.3)
Chloride: 84 mmol/L — ABNORMAL LOW (ref 101–111)
Creatinine, Ser: 8.67 mg/dL — ABNORMAL HIGH (ref 0.44–1.00)
Creatinine, Ser: 8.75 mg/dL — ABNORMAL HIGH (ref 0.44–1.00)
GFR calc Af Amer: 4 mL/min — ABNORMAL LOW (ref >60)
GFR calc Af Amer: 4 mL/min — ABNORMAL LOW (ref >60)
GFR calc non Af Amer: 4 mL/min — ABNORMAL LOW (ref >60)
GFR calc non Af Amer: 4 mL/min — ABNORMAL LOW (ref >60)
GLUCOSE: 94 mg/dL (ref 65–99)
Glucose, Bld: 94 mg/dL (ref 65–99)
POTASSIUM: 5.4 mmol/L — AB (ref 3.5–5.1)
Potassium: 5.7 mmol/L — ABNORMAL HIGH (ref 3.5–5.1)
Sodium: 118 mmol/L — CL (ref 135–145)
Sodium: 119 mmol/L — CL (ref 135–145)

## 2017-10-16 LAB — PHOSPHORUS: Phosphorus: 5.6 mg/dL — ABNORMAL HIGH (ref 2.5–4.6)

## 2017-10-16 LAB — URINALYSIS, ROUTINE W REFLEX MICROSCOPIC: Squamous Epithelial / LPF: NONE SEEN

## 2017-10-16 LAB — MAGNESIUM: Magnesium: 1.9 mg/dL (ref 1.7–2.4)

## 2017-10-16 LAB — GLUCOSE, CAPILLARY: Glucose-Capillary: 83 mg/dL (ref 65–99)

## 2017-10-16 MED ORDER — CALCIUM ACETATE (PHOS BINDER) 667 MG PO CAPS
1334.0000 mg | ORAL_CAPSULE | Freq: Three times a day (TID) | ORAL | Status: DC
  Administered 2017-10-16 – 2017-10-19 (×5): 1334 mg via ORAL
  Filled 2017-10-16 (×6): qty 2

## 2017-10-16 MED ORDER — VITAMIN D 1000 UNITS PO TABS
1000.0000 [IU] | ORAL_TABLET | Freq: Every day | ORAL | Status: DC
  Administered 2017-10-16 – 2017-10-27 (×11): 1000 [IU] via ORAL
  Filled 2017-10-16 (×13): qty 1

## 2017-10-16 MED ORDER — LEVOTHYROXINE SODIUM 25 MCG PO TABS
25.0000 ug | ORAL_TABLET | Freq: Every day | ORAL | Status: DC
  Administered 2017-10-16 – 2017-10-27 (×12): 25 ug via ORAL
  Filled 2017-10-16 (×13): qty 1

## 2017-10-16 MED ORDER — ADULT MULTIVITAMIN W/MINERALS CH
1.0000 | ORAL_TABLET | Freq: Every day | ORAL | Status: DC
  Administered 2017-10-16 – 2017-10-27 (×10): 1 via ORAL
  Filled 2017-10-16 (×12): qty 1

## 2017-10-16 MED ORDER — METOPROLOL TARTRATE 12.5 MG HALF TABLET
12.5000 mg | ORAL_TABLET | Freq: Two times a day (BID) | ORAL | Status: DC
  Administered 2017-10-16 – 2017-10-19 (×4): 12.5 mg via ORAL
  Filled 2017-10-16 (×8): qty 1

## 2017-10-16 MED ORDER — FUROSEMIDE 40 MG PO TABS: 120.0000 mg | ORAL_TABLET | Freq: Every day | ORAL | Status: DC

## 2017-10-16 MED ORDER — CALCIUM CARBONATE-VITAMIN D 500-200 MG-UNIT PO TABS
1.0000 | ORAL_TABLET | Freq: Two times a day (BID) | ORAL | Status: DC
  Administered 2017-10-16 – 2017-10-28 (×20): 1 via ORAL
  Filled 2017-10-16 (×24): qty 1

## 2017-10-16 MED ORDER — FLAX SEED OIL 1000 MG PO CAPS: 1.0000 | ORAL_CAPSULE | Freq: Every day | ORAL | Status: DC

## 2017-10-16 MED ORDER — CALCITRIOL 0.25 MCG PO CAPS
0.2500 ug | ORAL_CAPSULE | Freq: Every day | ORAL | Status: DC
  Administered 2017-10-16 – 2017-10-19 (×3): 0.25 ug via ORAL
  Filled 2017-10-16 (×3): qty 1

## 2017-10-16 MED ORDER — HEPARIN SODIUM (PORCINE) 5000 UNIT/ML IJ SOLN
5000.0000 [IU] | Freq: Three times a day (TID) | INTRAMUSCULAR | Status: DC
  Administered 2017-10-16 – 2017-10-17 (×4): 5000 [IU] via SUBCUTANEOUS
  Filled 2017-10-16 (×4): qty 1

## 2017-10-16 MED ORDER — VITAMIN B-12 1000 MCG PO TABS
1000.0000 ug | ORAL_TABLET | Freq: Every day | ORAL | Status: DC
  Administered 2017-10-16 – 2017-10-27 (×11): 1000 ug via ORAL
  Filled 2017-10-16 (×12): qty 1

## 2017-10-16 MED ORDER — CYCLOBENZAPRINE HCL 10 MG PO TABS: 5.0000 mg | ORAL_TABLET | Freq: Three times a day (TID) | ORAL | Status: DC | PRN

## 2017-10-16 MED ORDER — HYDROCORTISONE 2.5 % RE CREA
TOPICAL_CREAM | Freq: Two times a day (BID) | RECTAL | Status: DC
  Administered 2017-10-16 – 2017-10-19 (×5): via RECTAL
  Administered 2017-10-19: 1 via RECTAL
  Administered 2017-10-20 – 2017-10-25 (×10): via RECTAL
  Administered 2017-10-25: 1 via RECTAL
  Administered 2017-10-26 – 2017-10-27 (×4): via RECTAL
  Filled 2017-10-16 (×2): qty 28.35

## 2017-10-16 MED ORDER — METOPROLOL TARTRATE 25 MG PO TABS
25.0000 mg | ORAL_TABLET | Freq: Two times a day (BID) | ORAL | Status: DC
  Administered 2017-10-16 (×2): 25 mg via ORAL
  Filled 2017-10-16 (×2): qty 1

## 2017-10-16 MED ORDER — FERROUS GLUCONATE 324 (38 FE) MG PO TABS
325.0000 mg | ORAL_TABLET | Freq: Every day | ORAL | Status: DC
  Administered 2017-10-18 – 2017-10-19 (×2): 324 mg via ORAL
  Administered 2017-10-20: 325 mg via ORAL
  Administered 2017-10-21: 324 mg via ORAL
  Administered 2017-10-22: 325 mg via ORAL
  Filled 2017-10-16 (×6): qty 1

## 2017-10-16 MED ORDER — POLYETHYLENE GLYCOL 3350 17 G PO PACK: 17.0000 g | PACK | Freq: Every day | ORAL | Status: DC | PRN

## 2017-10-16 MED ORDER — SODIUM CHLORIDE 0.9 % IV SOLN
1.0000 g | Freq: Once | INTRAVENOUS | Status: AC
  Administered 2017-10-16: 1 g via INTRAVENOUS
  Filled 2017-10-16 (×2): qty 10

## 2017-10-16 NOTE — Consult Note (Signed)
Subjective: CC: Acute renal insufficiency.  Hx: Sharon [name] is an 83 yo WF who I was asked to see in consultation by Dr. [name] for progressive renal insufficiency with a history of metastatic urothelial carcinoma with bilateral ureteral obstruction.    She was previously followed by urology at [facility] and had cystoscopy with bilateral ureteral stenting on 09/25/17.  The findings were not definitive for urothelial CA in the bladder or the renal collecting systems at surgery.  Cytology was atypical.   The diagnosis of urothelial CA was from a liver biopsy.  Her Cr was rising prior to stenting but declined to 1.22 on 11/23 but it is now 8.5 during this admission.   She returned to [facility] to be with her sons.   She is quite deaf and unable to respond to questioning but per the family she has had some bladder pressure and her UOP has gradually declined until she is severely oliguric.  She had a renal US on 12/1 and there was some mild residual calyectasis on the right and the left wasn't well visualized.   On review of an [place] report from 11/10 there appears to be improvement in the degree of dilation.  She also had a CT during that hospitalization that showed diffuse hepatic metastases and lumbar and sacral mets.  She also has a remote history of lymphoma.   The path was metastatic non-small cell carcinoma with features suggestive of metastatic urothelial carcinoma.  ROS:  Review of Systems  Unable to perform ROS: Other  HENT: Positive for hearing loss (profoundly deaf. ).     No Known Allergies  Past Medical History:  Diagnosis Date  . Hypertension   . Kidney cancer, primary, with metastasis from kidney to other site [facility])   . Osteopenia   . Thyroid disease     Past Surgical History:  Procedure Laterality Date  . ABDOMINAL HYSTERECTOMY    . renal stents    . TONSILLECTOMY      Social History   Socioeconomic History  . Marital status: Married    Spouse name: Not on  file  . Number of children: Not on file  . Years of education: Not on file  . Highest education level: Not on file  Social Needs  . Financial resource strain: Not on file  . Food insecurity - worry: Not on file  . Food insecurity - inability: Not on file  . Transportation needs - medical: Not on file  . Transportation needs - non-medical: Not on file  Occupational History  . Not on file  Tobacco Use  . Smoking status: Never Smoker  . Smokeless tobacco: Never Used  Substance and Sexual Activity  . Alcohol use: No  . Drug use: No  . Sexual activity: Not Currently    Partners: Male  Other Topics Concern  . Not on file  Social History Narrative  . Not on file    Family History  Problem Relation Age of Onset  . Dementia Unknown   . COPD Unknown   . Hip fracture Unknown     Anti-infectives: Anti-infectives (From admission, onward)   None      Current Facility-Administered Medications  Medication Dose Route Frequency Provider Last Rate Last Dose  . 0.9 %  sodium chloride infusion   Intravenous Continuous Ivor Costa, MD 50 mL/hr at 10/16/17 0016    . acetaminophen (TYLENOL) tablet 650 mg  650 mg Oral Q6H PRN Ivor Costa, MD      .  calcitRIOL (ROCALTROL) capsule 0.25 mcg  0.25 mcg Oral Daily Ivor Costa, MD   0.25 mcg at 10/16/17 1027  . calcium acetate (PHOSLO) capsule 1,334 mg  1,334 mg Oral TID WC Ivor Costa, MD      . calcium-vitamin D (OSCAL WITH D) 500-200 MG-UNIT per tablet 1 tablet  1 tablet Oral BID Ivor Costa, MD   1 tablet at 10/16/17 1027  . cholecalciferol (VITAMIN D) tablet 1,000 Units  1,000 Units Oral Daily Ivor Costa, MD   1,000 Units at 10/16/17 1027  . cyclobenzaprine (FLEXERIL) tablet 5 mg  5 mg Oral TID PRN Ivor Costa, MD      . ferrous gluconate [facility]) tablet 325 mg  325 mg Oral Q breakfast Ivor Costa, MD      . heparin injection 5,000 Units  5,000 Units Subcutaneous Q8H Ivor Costa, MD   5,000 Units at 10/16/17 [id number]  . hydrALAZINE (APRESOLINE) injection  5 mg  5 mg Intravenous Q2H PRN Ivor Costa, MD      . hydrocortisone (ANUSOL-HC) 2.5 % rectal cream   Rectal BID Ivor Costa, MD      . levothyroxine (SYNTHROID, LEVOTHROID) tablet 25 mcg  25 mcg Oral QAC breakfast Ivor Costa, MD   25 mcg at 10/16/17 1031  . metoprolol tartrate (LOPRESSOR) tablet 25 mg  25 mg Oral BID Ivor Costa, MD   25 mg at 10/16/17 1027  . multivitamin with minerals tablet 1 tablet  1 tablet Oral Daily Ivor Costa, MD   1 tablet at 10/16/17 1027  . ondansetron (ZOFRAN) injection 4 mg  4 mg Intravenous Q8H PRN Ivor Costa, MD      . polyethylene glycol (MIRALAX / GLYCOLAX) packet 17 g  17 g Oral Daily PRN Ivor Costa, MD      . vitamin B-12 (CYANOCOBALAMIN) tablet 1,000 mcg  1,000 mcg Oral Daily Ivor Costa, MD   1,000 mcg at 10/16/17 1027  . zolpidem (AMBIEN) tablet 5 mg  5 mg Oral QHS PRN Ivor Costa, MD         Objective: Vital signs in last 24 hours: Temp:  [97.8 F (36.6 C)-98.3 F (36.8 C)] 97.8 F (36.6 C) (12/02 0531) Pulse Rate:  [59-83] 72 (12/02 0531) Resp:  [13-21] 19 (12/02 0531) BP: (110-147)/(52-99) 130/57 (12/02 0531) SpO2:  [94 %-100 %] 95 % (12/02 0531) Weight:  [62.6 kg (138 lb)-69.5 kg (153 lb 3.5 oz)] 69.5 kg (153 lb 3.5 oz) (12/02 0531)  Intake/Output from previous day: 12/01 0701 - 12/02 0700 In: 618.3 [I.V.:508.3; IV Piggyback:110] Out: -  Intake/Output this shift: No intake/output data recorded.   Physical Exam  Constitutional: She is well-developed, well-nourished, and in no distress.  HENT:  Head: Normocephalic and atraumatic.  Neck: Normal range of motion. Neck supple. No thyromegaly present.  Cardiovascular: Normal rate, regular rhythm and normal heart sounds.  Pulmonary/Chest: Effort normal and breath sounds normal. No respiratory distress.  Abdominal: Soft. She exhibits no distension and no mass. There is no tenderness.  Musculoskeletal: Normal range of motion. She exhibits edema. She exhibits no tenderness.  Lymphadenopathy:     She has cervical adenopathy.  Neurological: She is alert.  deaf  Skin: Skin is warm and dry.  Somewhat jaundiced in appearance.  Vitals reviewed.   Lab Results:  Recent Labs    11/12/2017 1758 10/16/17 0445  WBC 13.7* 11.7*  HGB 9.9* 9.0*  HCT 27.7* 26.3*  PLT 253 265   BMET Recent Labs    10/16/17 0005 10/16/17  0445  NA 118* 119*  K 5.7* 5.4*  CL 84* 86*  CO2 15* 16*  GLUCOSE 94 94  BUN 96* 94*  CREATININE 8.67* 8.75*  CALCIUM 6.0* 6.3*   PT/INR No results for input(s): LABPROT, INR in the last 72 hours. ABG Recent Labs    10/30/2017 1945  HCO3 16.3*    Studies/Results: US Renal  Result Date: 10/25/2017 CLINICAL DATA:  Renal failure. EXAM: RENAL / URINARY TRACT ULTRASOUND COMPLETE COMPARISON:  None. FINDINGS: Right Kidney: Length: 13.2 cm. Rounded regions of fluid in the left kidney are favored to represent caliectasis. Renal cysts considered less likely. No pelviectasis. No solid mass noted. Left Kidney: Length: 11.5 cm. The left kidney is not well visualized or characterized. Bladder: Decompressed. IMPRESSION: 1. Probable caliectasis in the right kidney. Renal cysts are considered less likely. No gross hydronephrosis with no pelviectasis. 2. The left kidney is not well visualized. 3. The bladder is poorly evaluated due to lack of distention. 4. A small amount of free fluid is seen in the right upper quadrant and pelvis, nonspecific. Electronically Signed   By: Dorise Bullion III M.D   On: 10/23/2017 21:06   I have reviewed her renal US films and report.  I have reviewed her current labs and labs back to early November in [place].   I have reviewed her Urine cytology and her surgical path.  I have reviewed extensive notes from her recent admission to [facility].  I have reviewed her CT and Renal US reports from [facility].   Assessment: AKI with a recent diagnosis of metastatic urothelial carcinoma with bilateral ureteral obstruction and bilateral  stent insert on 09/25/17.  I don't think the ARI is secondary to obstruction but she did have a marked improvement with the stents initially.   Renal US shows minimal residual dilation.    I have requested foley catheter placement to better decompress the system.  If she doesn't have improvement with that, we could consider stent exchange. Marland Kitchen     She was supposed to be set up with oncology here and it would be worthwhile to have her seen while in hospital.  The diagnosis of  Urothelial CA was from the liver biopsy but with the lack of suspicious cystoscopy and upper tract findings it could be possible that there is another primary.  I would recommend requesting all of her imaging studies be added to our PACs for local assessment and request her path so that the material can be reviewed hear.  A palliative care consultation may be worthwhile considering the extent of disease and her progressive ARI.    CC: Dr. Jennette Banker     Irine Seal 10/16/2017 [phone]

## 2017-10-16 NOTE — Progress Notes (Signed)
Patient had foley inserted earlier today and her urine is bloody in color.  Urine sample collected and sent.  Will continue to monitor.  Wilson Singer, RN

## 2017-10-16 NOTE — Progress Notes (Signed)
CRITICAL VALUE ALERT  Critical Value:  Na 119/Calcium 6.3   Date & Time Notied:  10/16/17 @ 6:35  Provider Notified: hospitalist on call  Orders Received/Actions taken: continue to monitor

## 2017-10-16 NOTE — Consult Note (Signed)
Renal Service Consult Note [facility]  Sharon [name] 10/16/2017 Sol Blazing Requesting Physician:  Dr [name]  Reason for Consult:  Acute renal failure HPI: The patient is a 83 y.o. year-old with hx of HTN, hypothyroid who [place] the beach [place] and became sick and was hospitalized in [place], [place] at [facility]. She had acute renal failure and obstruction, liver mets on CT and bony mets.  She underwent bilat ureteral stent placement which was exactly 3 wks ago today.  She had a liver biopsy which showed tissue c/w metastatic urothelial origin.  Exact primary was unknown.  Her family wanted to bring her to [place] to finish the work-up, see ONC , etc and pt was dc'd about 9 days ago. She returns to hospital for gen'd weakness, fatigue and decreased UOP.  In the ED her creat was 8, K 6 and Na 119. A renal US showed difficult to see L kidney and probable caliectasis on the R side.  She was admitted to [facility] and today the creat is 8.75 and K 5.4, Na 119 again.  We are asked to see for renal fialure.     Pt is very HOH, family reports gen'd weakness for a few days, no SOB, CP, no n/v/d, no abd pain, no rash. + no UOP the last 36- 48 hours.  No fevers.    A CT scan done today shows sig hydronephrosis of an enlarged R kidney, 13cm.  The left kidney is quite atrophic and has mild hydro as well.  Stents present in both kidneys. BP's are good at 120/ 50, HR 70 and RR 16.   Bladder scan showed 150 cc.      Home meds:  -flexeril prn/ fe po/ synthroid/ MVI/ vitamins -lasix 40 qd/ metoprolol 25 bid/ (lisinopril-HCT - not taking)   ROS  denies CP  no joint pain   no HA  no blurry vision  no rash  no diarrhea  no nausea/ vomiting     Past Medical History  Past Medical History:  Diagnosis Date  . Acute renal insufficiency   . CHF (congestive heart failure) ([place])   . Hypertension   . Kidney cancer, primary, with metastasis from kidney to other site [facility])    . Lymphoma ([place])   . Osteopenia   . Thyroid disease    Past Surgical History  Past Surgical History:  Procedure Laterality Date  . ABDOMINAL HYSTERECTOMY    . renal stents    . TONSILLECTOMY     Family History  Family History  Problem Relation Age of Onset  . Dementia Unknown   . COPD Unknown   . Hip fracture Unknown    Social History  reports that  has never smoked. she has never used smokeless tobacco. She reports that she does not drink alcohol or use drugs. Allergies No Known Allergies Home medications Prior to Admission medications   Medication Sig Start Date End Date Taking? Authorizing Provider  Calcium Carbonate-Vitamin D (CALCIUM + D) 600-200 MG-UNIT TABS Take 2 tablets by mouth daily.     Yes [provider]  cholecalciferol (VITAMIN D) 1000 UNITS tablet Take 1,000 Units by mouth daily.     Yes [provider]  cyclobenzaprine (FLEXERIL) 5 MG tablet Take 5 mg by mouth 3 (three) times daily as needed for muscle spasms.   Yes [provider]  ferrous gluconate (FERGON) 325 MG tablet Take 325 mg by mouth daily with breakfast.  Yes [provider]  Flaxseed, Linseed, (FLAX SEED OIL) 1000 MG CAPS Take 1 capsule by mouth daily.     Yes [provider]  furosemide (LASIX) 40 MG tablet Take 40 mg by mouth daily.   Yes [provider]  levothyroxine (SYNTHROID, LEVOTHROID) 25 MCG tablet 1 po qd 09/25/12  Yes Roma Schanz R, DO  metoprolol tartrate (LOPRESSOR) 25 MG tablet Take 25 mg by mouth 2 (two) times daily.   Yes [provider]  Multiple Vitamin (MULTIVITAMIN) tablet Take 1 tablet by mouth daily.     Yes [provider]  vitamin B-12 (CYANOCOBALAMIN) 1000 MCG tablet Take 1,000 mcg by mouth daily.     Yes [provider]  zoster vaccine live, PF, (ZOSTAVAX) 20947 UNT/0.65ML injection Inject 19,400 Units into the skin once. 09/25/12  Yes Roma Schanz R, DO  alendronate (FOSAMAX)  70 MG tablet Take with a full glass of water on an empty stomach. Patient not taking: Reported on 11/01/2017 09/25/12   Carollee Herter, Alferd Apa, DO  ciprofloxacin (CIPRO) 250 MG tablet Take 1 tablet (250 mg total) by mouth 2 (two) times daily. Patient not taking: Reported on 10/25/2017 09/27/12   Carollee Herter, Alferd Apa, DO  lisinopril-hydrochlorothiazide (PRINZIDE,ZESTORETIC) 10-12.5 MG per tablet 1 tab by mouth daily--Office visit due now Patient not taking: Reported on 10/19/2017 09/21/13   Ann Held, DO   Liver Function Tests No results for input(s): AST, ALT, ALKPHOS, BILITOT, PROT, ALBUMIN in the last 168 hours. No results for input(s): LIPASE, AMYLASE in the last 168 hours. CBC Recent Labs  Lab 11/11/2017 1758 10/16/17 0445  WBC 13.7* 11.7*  HGB 9.9* 9.0*  HCT 27.7* 26.3*  MCV 76.7* 77.6*  PLT 253 096   Basic Metabolic Panel Recent Labs  Lab 11/09/2017 1758 10/16/17 0005 10/16/17 0445 10/16/17 1003  NA 119* 118* 119*  --   K 6.0* 5.7* 5.4*  --   CL 84* 84* 86*  --   CO2 16* 15* 16*  --   GLUCOSE 125* 94 94  --   BUN 95* 96* 94*  --   CREATININE 8.39* 8.67* 8.75*  --   CALCIUM 6.2* 6.0* 6.3*  --   PHOS  --   --   --  5.6*   Iron/TIBC/Ferritin/ %Sat    Component Value Date/Time   IRON 104 10/13/2006 1303   FERRITIN 20.0 10/13/2006 1303   IRONPCTSAT 30.6 10/13/2006 1303    Vitals:   10/16/17 0152 10/16/17 0300 10/16/17 0531 10/16/17 1431  BP: (!) 142/52  (!) 130/57 (!) 121/52  Pulse: 73  72 68  Resp: '18  19 16  ' Temp: 98.3 F (36.8 C)  97.8 F (36.6 C) 98.6 F (37 C)  TempSrc: Oral  Oral Oral  SpO2: 95%  95% 92%  Weight:  68.4 kg (150 lb 12.7 oz) 69.5 kg (153 lb 3.5 oz)   Height:  '5\' 3"'  (1.6 m)     Exam Gen elderly WF, HOH, no distress No rash, cyanosis or gangrene Sclera anicteric, throat clear  No jvd or bruits Chest clear bilat RRR no MRG Abd soft ntnd no mass or ascites +bs obese GU defer MS no joint effusions or deformity Ext sig 2+ bilat  pitting LE edema, R > L , no wounds or ulcers Neuro is alert, Ox 3, no asterixis, gen'd weakness    Impression: 1. Acute renal failure - recurrent issue w/ recent bilat ureteral stent placement in [place],  [place] 3 wks ago today.  She is not dehydrated, rather vol overloaded.  BP's are good.  Urology is evaluating.  CT shows a significantly atrophic L kidney , may not be functional. No indication for acute dialysis.  Send UA if can get any urine.  No need for sig hydration.  2. HTN - bp's not high, lowered metop dose 3. Metastatic urothelial cancer - to liver, bones. Had liver met bx at [facility].  4. Vol excess - not severe, follow.   Plan - as above  Kelly Splinter MD [facility] pager [phone]   10/16/2017, 5:43 PM

## 2017-10-16 NOTE — Progress Notes (Signed)
CRITICAL VALUE ALERT  Critical Value:  Na 118/Calcium 6.0  Date & Time Notied:  10/16/17 @ 01:55  Provider Notified: Dr. Maudie Mercury  Orders Received/Actions taken: continue to monitor and give meds ordered

## 2017-10-16 NOTE — Progress Notes (Signed)
Patient ID: Sharon [name], female   DOB: 1932/06/15, 83 y.o.   MRN: [id number] I have reviewed her CT films.  There is mild bilateral hydro right > left but the stents are in good position.  The foley had not been placed at the time of the scan.     I think we should monitor her Cr and UOP with the foley in place and if it doesn't decline, repeat a renal US and if hydro persists, I can change the stents.

## 2017-10-16 NOTE — Progress Notes (Signed)
Notified nephrology that patient had arrived from [place] around 2:00am.  The nephrologist had been notified, according to physician note from [place], about the patient situation.  Will continue to monitor.  Wilson Singer, RN

## 2017-10-16 NOTE — Progress Notes (Signed)
PROGRESS NOTE  Sharon [name] PZW:[id number] DOB: [birth year] DOA: 10/21/2017 PCP: Ann Held, DO  HPI/Recap of past 24 hours: HPI from Dr Ivor Costa on 11/03/2017 Sharon [name] is a 83 y.o. female with medical history significant of hypertension, hypothyroidism, iron deficiency anemia, newly diagnosed metastatic urothelial cancer, s/p of bilateral ureter stent placement due to obstruction, who presents with decreased urine output. Per patient's son, patient was recently hospitalized at [facility] on 11/18, found to have metastasized urothelial cancer (to liver and lumbar spine) after presenting with ARF. During that admission patient had bilateral ureter stent placement due to obstruction and requested further care be done here in [place]. As per son, for the past 24hrs prior to arrival, pt was found to have decreased urine output, she only had a few drops of urine. Patient denies symptoms of UTI. No nausea, vomiting, diarrhea or abdominal pain, chest pain, SOB. Pt reports worsening generalized weakness, bilateral leg edema. In the ED, pt was found to have uremia with potassium 6.0, bicarbonate 16, creatinine 8.39, BUN 95, sodium 119, WBC 13.7. Patient admitted to telemetry bed for further management. Nephrology and Urology were consulted.  Today, pt denied any new complains. Denies any chest pain, SOB, not in any resp distress, abdominal pain, N/V/D/C, fever/chills. Of note, pt is very hard of hearing.   Assessment/Plan: Principal Problem:   Uremia Active Problems:   Hypothyroidism   Essential hypertension   Iron deficiency anemia   Hyponatremia   Hyperkalemia   Metabolic acidosis   Hypocalcemia   Urothelial cancer (HCC)  #Acute renal failure/Uremic: Likely due to urinary retension Vs Hydronephrosis Pt s/p of bilateral stent placement in 11/18 at OSH   Renal US showed probable caliectasis in the right kidney, but no gross hydronephrosis with no  pelviectasis CT abd/pelvis: Mild bilateral hydronephrosis, right> left. Left kidney is atrophic. Large bilateral pleural effusions with bilateral lower lobe atelectasis or pneumonia. Numerous lesions throughout the liver most suggestive of metastatic disease. Morphologic features of the liver suggests cirrhosis. Extensive bony metastatic disease in the spine and pelvis compatible with sclerotic bone metastases Nephrology consulted, appreciate rec Urology consulted, rec foley placement and possible change of stents if ARF persists Monitor closely, daily bmp  #Hyponatremia: Asymptomatic, no met encephalopathy, likely chronic as well Likely due to uremia Gentle hydration with IVF @ 50 mL per hour Hold home Prinzide and Lasix Daily bmp  #Hyperkalemia: K=6.0. No T peaking on EKG on admission Resolving S/P Kayexalate 45 g 1, calcium gluconate, D50 and insulin Daily BMP  #Hypocalcemia: Calcium 6.2 on admission S/P 2 g of calcium Gluconate Start calcitriol and PhosLo  #Metabolic acidosis Due to uremia IV fluid as above  #Metastatic Urothelial cancer (HCC) Newly diagnosed on 11/18 S/p bilateral ureteral stent placement due to ARF 2/2 obstruction Will consult oncology  #HTN: Stable Continue home Metoprolol, hold Prinzide and Lasix due to uremia and hyponatremia -IV hydralazine prn  #Hypothyroidism: Last TSH was 1.49 on 09/25/12 -Continue home Synthroid  #Iron deficiency anemia: -Continue to iron supplement    Code Status: Full  Family Communication: Spoke with son and husband about plan of management  Disposition Plan: Home once stable   Consultants:  Urology  Nephrology  Procedures:  None  Antimicrobials:  None  DVT prophylaxis:  Heparin   Objective: Vitals:   10/16/17 0152 10/16/17 0300 10/16/17 0531 10/16/17 1431  BP: (!) 142/52  (!) 130/57 (!) 121/52  Pulse: 73  72 68  Resp: 18  19 16  Temp: 98.3 F (36.8 C)  97.8 F (36.6 C) 98.6 F (37 C)   TempSrc: Oral  Oral Oral  SpO2: 95%  95% 92%  Weight:  68.4 kg (150 lb 12.7 oz) 69.5 kg (153 lb 3.5 oz)   Height:  _0  (1.6 m)      Intake/Output Summary (Last 24 hours) at 10/16/2017 1659 Last data filed at 10/16/2017 1400 Gross per 24 hour  Intake 618.33 ml  Output -  Net 618.33 ml   Filed Weights   10/26/2017 1859 10/16/17 0300 10/16/17 0531  Weight: 62.6 kg (138 lb) 68.4 kg (150 lb 12.7 oz) 69.5 kg (153 lb 3.5 oz)    Exam:   General:  Alert, awake, oriented, in no distress  Cardiovascular: S1-S2 present, no added sound  Respiratory: Bibasilar crackles noted  Abdomen: Soft, non tender, non distended, BS present  Musculoskeletal: 2+ pitting LE edema bilaterally  Skin: Normal  Psychiatry: Normal mood   Data Reviewed: CBC: Recent Labs  Lab 11/13/2017 1758 10/16/17 0445  WBC 13.7* 11.7*  HGB 9.9* 9.0*  HCT 27.7* 26.3*  MCV 76.7* 77.6*  PLT 253 829   Basic Metabolic Panel: Recent Labs  Lab 10/21/2017 1758 10/16/17 0005 10/16/17 0445 10/16/17 1003  NA 119* 118* 119*  --   K 6.0* 5.7* 5.4*  --   CL 84* 84* 86*  --   CO2 16* 15* 16*  --   GLUCOSE 125* 94 94  --   BUN 95* 96* 94*  --   CREATININE 8.39* 8.67* 8.75*  --   CALCIUM 6.2* 6.0* 6.3*  --   MG  --   --   --  1.9  PHOS  --   --   --  5.6*   GFR: Estimated Creatinine Clearance: 4.6 mL/min (A) (by C-G formula based on SCr of 8.75 mg/dL (H)). Liver Function Tests: No results for input(s): AST, ALT, ALKPHOS, BILITOT, PROT, ALBUMIN in the last 168 hours. No results for input(s): LIPASE, AMYLASE in the last 168 hours. No results for input(s): AMMONIA in the last 168 hours. Coagulation Profile: No results for input(s): INR, PROTIME in the last 168 hours. Cardiac Enzymes: No results for input(s): CKTOTAL, CKMB, CKMBINDEX, TROPONINI in the last 168 hours. BNP (last 3 results) No results for input(s): PROBNP in the last 8760 hours. HbA1C: No results for input(s): HGBA1C in the last 72  hours. CBG: Recent Labs  Lab 10/16/17 0750  GLUCAP 83   Lipid Profile: No results for input(s): CHOL, HDL, LDLCALC, TRIG, CHOLHDL, LDLDIRECT in the last 72 hours. Thyroid Function Tests: No results for input(s): TSH, T4TOTAL, FREET4, T3FREE, THYROIDAB in the last 72 hours. Anemia Panel: No results for input(s): VITAMINB12, FOLATE, FERRITIN, TIBC, IRON, RETICCTPCT in the last 72 hours. Urine analysis:    Component Value Date/Time   COLORURINE yellow 08/24/2010 0824   APPEARANCEUR Clear 08/24/2010 0824   LABSPEC <1.005 08/24/2010 0824   PHURINE 7.5 08/24/2010 0824   HGBUR negative 08/24/2010 0824   BILIRUBINUR Neg 09/25/2012 1453   PROTEINUR Neg 09/25/2012 1453   UROBILINOGEN 0.2 09/25/2012 1453   UROBILINOGEN 0.2 08/24/2010 0824   NITRITE Neg 09/25/2012 1453   NITRITE negative 08/24/2010 0824   LEUKOCYTESUR moderate (2+) 09/25/2012 1453   Sepsis Labs: _1 (procalcitonin:4,lacticidven:4)  )No results found for this or any previous visit (from the past 240 hour(s)).    Studies: Ct Abdomen Pelvis Wo Contrast  Result Date: 10/16/2017 CLINICAL DATA:  Hydronephrosis. History of bilateral  renal stents through hydronephrosis. Worsening acute renal failure. EXAM: CT ABDOMEN AND PELVIS WITHOUT CONTRAST TECHNIQUE: Multidetector CT imaging of the abdomen and pelvis was performed following the standard protocol without IV contrast. COMPARISON:  Ultrasound 10/28/2017 FINDINGS: Lower chest: Large bilateral pleural effusions with compressive atelectasis or pneumonia in the lower lobes. Heart is normal size. Hepatobiliary: Numerous ill-defined masses throughout the liver. There are morphologic changes of cirrhosis with nodular contours. Small gallstones layering in the gallbladder. Pancreas: No focal abnormality or ductal dilatation. Spleen: No focal abnormality.  Normal size. Adrenals/Urinary Tract: Bilateral ureteral stents are in place in the within the bladder. Mild bilateral  hydronephrosis, right greater than left. Left kidney is atrophic. Stomach/Bowel: Sigmoid diverticulosis. No active diverticulitis. No evidence of bowel obstruction. Vascular/Lymphatic: Diffuse aortic and iliac calcifications. No aneurysm or adenopathy. Reproductive: Prior hysterectomy.  No adnexal masses. Other: Small amount of free fluid in the pelvis and around the liver and spleen. No free air. Extensive edema throughout the subcutaneous soft tissues. Musculoskeletal: Extensive sclerotic lesions throughout the thoracic and lumbar spine and sacrum compatible with sclerotic metastases. IMPRESSION: Numerous lesions throughout the liver most suggestive of metastatic disease. Morphologic features of the liver suggests cirrhosis. Extensive bony metastatic disease in the spine and pelvis compatible with sclerotic bone metastases. Mild bilateral hydronephrosis, right greater than left. Left kidney is atrophic. Large bilateral pleural effusions with bilateral lower lobe atelectasis or pneumonia. Cholelithiasis. Sigmoid diverticulosis. Electronically Signed   By: Rolm Baptise M.D.   On: 10/16/2017 15:12   US Renal  Result Date: 10/24/2017 CLINICAL DATA:  Renal failure. EXAM: RENAL / URINARY TRACT ULTRASOUND COMPLETE COMPARISON:  None. FINDINGS: Right Kidney: Length: 13.2 cm. Rounded regions of fluid in the left kidney are favored to represent caliectasis. Renal cysts considered less likely. No pelviectasis. No solid mass noted. Left Kidney: Length: 11.5 cm. The left kidney is not well visualized or characterized. Bladder: Decompressed. IMPRESSION: 1. Probable caliectasis in the right kidney. Renal cysts are considered less likely. No gross hydronephrosis with no pelviectasis. 2. The left kidney is not well visualized. 3. The bladder is poorly evaluated due to lack of distention. 4. A small amount of free fluid is seen in the right upper quadrant and pelvis, nonspecific. Electronically Signed   By: Dorise Bullion III  M.D   On: 10/16/2017 21:06    Scheduled Meds: . calcitRIOL  0.25 mcg Oral Daily  . calcium acetate  1,334 mg Oral TID WC  . calcium-vitamin D  1 tablet Oral BID  . cholecalciferol  1,000 Units Oral Daily  . ferrous gluconate  325 mg Oral Q breakfast  . heparin  5,000 Units Subcutaneous Q8H  . hydrocortisone   Rectal BID  . levothyroxine  25 mcg Oral QAC breakfast  . metoprolol tartrate  25 mg Oral BID  . multivitamin with minerals  1 tablet Oral Daily  . vitamin B-12  1,000 mcg Oral Daily    Continuous Infusions: . sodium chloride 50 mL/hr at 10/16/17 0016     LOS: 1 day     Alma Friendly, MD [facility]  If 7PM-7AM, please contact night-coverage [email or web address] Password [email or web address] 10/16/2017, 4:59 PM

## 2017-10-17 ENCOUNTER — Inpatient Hospital Stay (HOSPITAL_COMMUNITY): Payer: Medicare Other

## 2017-10-17 ENCOUNTER — Encounter (HOSPITAL_COMMUNITY): Payer: Self-pay | Admitting: Interventional Radiology

## 2017-10-17 HISTORY — PX: IR NEPHROSTOMY PLACEMENT RIGHT: IMG6064

## 2017-10-17 LAB — CBC WITH DIFFERENTIAL/PLATELET
BASOS PCT: 0 %
Basophils Absolute: 0 10*3/uL (ref 0.0–0.1)
EOS ABS: 0 10*3/uL (ref 0.0–0.7)
EOS PCT: 0 %
HEMATOCRIT: 26.6 % — AB (ref 36.0–46.0)
Hemoglobin: 9.3 g/dL — ABNORMAL LOW (ref 12.0–15.0)
Lymphocytes Relative: 10 %
Lymphs Abs: 0.9 10*3/uL (ref 0.7–4.0)
MCH: 26.5 pg (ref 26.0–34.0)
MCHC: 35 g/dL (ref 30.0–36.0)
MCV: 75.8 fL — ABNORMAL LOW (ref 78.0–100.0)
MONO ABS: 0.4 10*3/uL (ref 0.1–1.0)
MONOS PCT: 4 %
NEUTROS ABS: 8.3 10*3/uL — AB (ref 1.7–7.7)
Neutrophils Relative %: 86 %
PLATELETS: 244 10*3/uL (ref 150–400)
RBC: 3.51 MIL/uL — ABNORMAL LOW (ref 3.87–5.11)
RDW: 15.1 % (ref 11.5–15.5)
WBC: 9.7 10*3/uL (ref 4.0–10.5)

## 2017-10-17 LAB — BASIC METABOLIC PANEL
Anion gap: 18 — ABNORMAL HIGH (ref 5–15)
BUN: 105 mg/dL — AB (ref 6–20)
CALCIUM: 6.4 mg/dL — AB (ref 8.9–10.3)
CO2: 17 mmol/L — AB (ref 22–32)
CREATININE: 9.15 mg/dL — AB (ref 0.44–1.00)
Chloride: 88 mmol/L — ABNORMAL LOW (ref 101–111)
GFR calc Af Amer: 4 mL/min — ABNORMAL LOW (ref >60)
GFR, EST NON AFRICAN AMERICAN: 3 mL/min — AB (ref >60)
GLUCOSE: 62 mg/dL — AB (ref 65–99)
Potassium: 5.2 mmol/L — ABNORMAL HIGH (ref 3.5–5.1)
Sodium: 123 mmol/L — ABNORMAL LOW (ref 135–145)

## 2017-10-17 LAB — GLUCOSE, CAPILLARY
GLUCOSE-CAPILLARY: 130 mg/dL — AB (ref 65–99)
GLUCOSE-CAPILLARY: 79 mg/dL (ref 65–99)
Glucose-Capillary: 57 mg/dL — ABNORMAL LOW (ref 65–99)

## 2017-10-17 LAB — PROTIME-INR
INR: 1.28
Prothrombin Time: 15.9 seconds — ABNORMAL HIGH (ref 11.4–15.2)

## 2017-10-17 LAB — APTT: APTT: 43 s — AB (ref 24–36)

## 2017-10-17 MED ORDER — DEXTROSE-NACL 5-0.9 % IV SOLN
INTRAVENOUS | Status: DC
  Administered 2017-10-17 – 2017-10-18 (×2): via INTRAVENOUS

## 2017-10-17 MED ORDER — FENTANYL CITRATE (PF) 100 MCG/2ML IJ SOLN
INTRAMUSCULAR | Status: AC | PRN
  Administered 2017-10-17: 50 ug via INTRAVENOUS
  Administered 2017-10-17: 25 ug via INTRAVENOUS

## 2017-10-17 MED ORDER — FENTANYL CITRATE (PF) 100 MCG/2ML IJ SOLN
INTRAMUSCULAR | Status: AC
  Filled 2017-10-17: qty 2

## 2017-10-17 MED ORDER — CIPROFLOXACIN IN D5W 400 MG/200ML IV SOLN
400.0000 mg | INTRAVENOUS | Status: AC
  Administered 2017-10-17: 400 mg via INTRAVENOUS

## 2017-10-17 MED ORDER — CIPROFLOXACIN IN D5W 400 MG/200ML IV SOLN
INTRAVENOUS | Status: AC
  Filled 2017-10-17: qty 200

## 2017-10-17 MED ORDER — HEPARIN SODIUM (PORCINE) 5000 UNIT/ML IJ SOLN
5000.0000 [IU] | Freq: Three times a day (TID) | INTRAMUSCULAR | Status: DC
  Administered 2017-10-18 – 2017-10-20 (×7): 5000 [IU] via SUBCUTANEOUS
  Filled 2017-10-17 (×7): qty 1

## 2017-10-17 MED ORDER — SODIUM CHLORIDE 0.9 % IV SOLN
INTRAVENOUS | Status: AC | PRN
  Administered 2017-10-17: 30 mL/h via INTRAVENOUS

## 2017-10-17 MED ORDER — SODIUM CHLORIDE 0.9 % IV SOLN
2.0000 g | Freq: Once | INTRAVENOUS | Status: AC
  Administered 2017-10-17: 2 g via INTRAVENOUS
  Filled 2017-10-17: qty 20

## 2017-10-17 MED ORDER — MIDAZOLAM HCL 2 MG/2ML IJ SOLN
INTRAMUSCULAR | Status: AC | PRN
  Administered 2017-10-17: 0.5 mg via INTRAVENOUS
  Administered 2017-10-17: 1 mg via INTRAVENOUS

## 2017-10-17 MED ORDER — LIDOCAINE HCL 1 % IJ SOLN
INTRAMUSCULAR | Status: AC
  Filled 2017-10-17: qty 20

## 2017-10-17 MED ORDER — MIDAZOLAM HCL 2 MG/2ML IJ SOLN
INTRAMUSCULAR | Status: AC
  Filled 2017-10-17: qty 2

## 2017-10-17 MED ORDER — IOPAMIDOL (ISOVUE-300) INJECTION 61%
INTRAVENOUS | Status: AC
  Filled 2017-10-17: qty 50

## 2017-10-17 MED ORDER — DEXTROSE 50 % IV SOLN
25.0000 g | Freq: Once | INTRAVENOUS | Status: AC
  Administered 2017-10-17: 25 g via INTRAVENOUS
  Filled 2017-10-17: qty 50

## 2017-10-17 MED ORDER — LIDOCAINE HCL (PF) 1 % IJ SOLN
INTRAMUSCULAR | Status: AC | PRN
  Administered 2017-10-17: 5 mL

## 2017-10-17 NOTE — Sedation Documentation (Signed)
Patient is resting comfortably.  Dr Laurence Ferrari in to see pt, awaiting IR room readiness.

## 2017-10-17 NOTE — Progress Notes (Signed)
Assessment and Plan: AKI with bilateral hydro and recent stent placement.   Her Cr is up to 9.15 this AM but she has about 170ml of urine in the bag.   I would recommend a renal US this AM and if there is persistent hydronephrosis since foley placement she would potentially benefit from either a percutaneous nephrostomy on the right to start or bilateral stent exchange.     Subjective: Pt is sleeping comfortably.   The CT showed right > left hydro and a foley has subsequently been placed.  There is no recorded UOP in the chart but there is about 155ml of blood tinged urine in the bag.  Her Cr is up again to 9.15.    ROS:  Review of Systems  Unable to perform ROS: Other    Anti-infectives: Anti-infectives (From admission, onward)   None      Current Facility-Administered Medications  Medication Dose Route Frequency Provider Last Rate Last Dose  . 0.9 %  sodium chloride infusion   Intravenous Continuous Ivor Costa, MD 50 mL/hr at 10/16/17 0016    . acetaminophen (TYLENOL) tablet 650 mg  650 mg Oral Q6H PRN Ivor Costa, MD      . calcitRIOL (ROCALTROL) capsule 0.25 mcg  0.25 mcg Oral Daily Ivor Costa, MD   0.25 mcg at 10/16/17 1027  . calcium acetate (PHOSLO) capsule 1,334 mg  1,334 mg Oral TID WC Ivor Costa, MD   1,334 mg at 10/16/17 1732  . calcium gluconate 2 g in sodium chloride 0.9 % 100 mL IVPB  2 g Intravenous Once Rise Patience, MD 120 mL/hr at 10/17/17 0612 2 g at 10/17/17 0612  . calcium-vitamin D (OSCAL WITH D) 500-200 MG-UNIT per tablet 1 tablet  1 tablet Oral BID Ivor Costa, MD   1 tablet at 10/16/17 2109  . cholecalciferol (VITAMIN D) tablet 1,000 Units  1,000 Units Oral Daily Ivor Costa, MD   1,000 Units at 10/16/17 1027  . cyclobenzaprine (FLEXERIL) tablet 5 mg  5 mg Oral TID PRN Ivor Costa, MD      . ferrous gluconate [facility]) tablet 325 mg  325 mg Oral Q breakfast Ivor Costa, MD      . heparin injection 5,000 Units  5,000 Units Subcutaneous Q8H Ivor Costa, MD    5,000 Units at 10/17/17 6283  . hydrocortisone (ANUSOL-HC) 2.5 % rectal cream   Rectal BID Ivor Costa, MD      . levothyroxine (SYNTHROID, LEVOTHROID) tablet 25 mcg  25 mcg Oral QAC breakfast Ivor Costa, MD   25 mcg at 10/16/17 1031  . metoprolol tartrate (LOPRESSOR) tablet 12.5 mg  12.5 mg Oral BID Roney Jaffe, MD   12.5 mg at 10/16/17 2109  . multivitamin with minerals tablet 1 tablet  1 tablet Oral Daily Ivor Costa, MD   1 tablet at 10/16/17 1027  . ondansetron (ZOFRAN) injection 4 mg  4 mg Intravenous Q8H PRN Ivor Costa, MD      . polyethylene glycol (MIRALAX / GLYCOLAX) packet 17 g  17 g Oral Daily PRN Ivor Costa, MD      . vitamin B-12 (CYANOCOBALAMIN) tablet 1,000 mcg  1,000 mcg Oral Daily Ivor Costa, MD   1,000 mcg at 10/16/17 1027  . zolpidem (AMBIEN) tablet 5 mg  5 mg Oral QHS PRN Ivor Costa, MD         Objective: Vital signs in last 24 hours: Temp:  [97.6 F (36.4 C)-98.6 F (37 C)] 97.6 F (36.4 C) (12/03  0501) Pulse Rate:  [61-114] 114 (12/03 0501) Resp:  [12-16] 16 (12/03 0501) BP: (121-135)/(39-52) 130/39 (12/03 0501) SpO2:  [92 %-97 %] 97 % (12/03 0501)  Intake/Output from previous day: No intake/output data recorded. Intake/Output this shift: No intake/output data recorded.   Physical Exam  Constitutional: She is well-developed, well-nourished, and in no distress.  Vitals reviewed.   Lab Results:  Recent Labs    10/16/17 0445 10/17/17 0341  WBC 11.7* 9.7  HGB 9.0* 9.3*  HCT 26.3* 26.6*  PLT 265 244   BMET Recent Labs    10/16/17 0445 10/17/17 0341  NA 119* 123*  K 5.4* 5.2*  CL 86* 88*  CO2 16* 17*  GLUCOSE 94 62*  BUN 94* 105*  CREATININE 8.75* 9.15*  CALCIUM 6.3* 6.4*   PT/INR No results for input(s): LABPROT, INR in the last 72 hours. ABG Recent Labs    11/11/2017 1945  HCO3 16.3*    Studies/Results: Ct Abdomen Pelvis Wo Contrast  Result Date: 10/16/2017 CLINICAL DATA:  Hydronephrosis. History of bilateral renal stents  through hydronephrosis. Worsening acute renal failure. EXAM: CT ABDOMEN AND PELVIS WITHOUT CONTRAST TECHNIQUE: Multidetector CT imaging of the abdomen and pelvis was performed following the standard protocol without IV contrast. COMPARISON:  Ultrasound 10/30/2017 FINDINGS: Lower chest: Large bilateral pleural effusions with compressive atelectasis or pneumonia in the lower lobes. Heart is normal size. Hepatobiliary: Numerous ill-defined masses throughout the liver. There are morphologic changes of cirrhosis with nodular contours. Small gallstones layering in the gallbladder. Pancreas: No focal abnormality or ductal dilatation. Spleen: No focal abnormality.  Normal size. Adrenals/Urinary Tract: Bilateral ureteral stents are in place in the within the bladder. Mild bilateral hydronephrosis, right greater than left. Left kidney is atrophic. Stomach/Bowel: Sigmoid diverticulosis. No active diverticulitis. No evidence of bowel obstruction. Vascular/Lymphatic: Diffuse aortic and iliac calcifications. No aneurysm or adenopathy. Reproductive: Prior hysterectomy.  No adnexal masses. Other: Small amount of free fluid in the pelvis and around the liver and spleen. No free air. Extensive edema throughout the subcutaneous soft tissues. Musculoskeletal: Extensive sclerotic lesions throughout the thoracic and lumbar spine and sacrum compatible with sclerotic metastases. IMPRESSION: Numerous lesions throughout the liver most suggestive of metastatic disease. Morphologic features of the liver suggests cirrhosis. Extensive bony metastatic disease in the spine and pelvis compatible with sclerotic bone metastases. Mild bilateral hydronephrosis, right greater than left. Left kidney is atrophic. Large bilateral pleural effusions with bilateral lower lobe atelectasis or pneumonia. Cholelithiasis. Sigmoid diverticulosis. Electronically Signed   By: Rolm Baptise M.D.   On: 10/16/2017 15:12   US Renal  Result Date: 11/01/2017 CLINICAL  DATA:  Renal failure. EXAM: RENAL / URINARY TRACT ULTRASOUND COMPLETE COMPARISON:  None. FINDINGS: Right Kidney: Length: 13.2 cm. Rounded regions of fluid in the left kidney are favored to represent caliectasis. Renal cysts considered less likely. No pelviectasis. No solid mass noted. Left Kidney: Length: 11.5 cm. The left kidney is not well visualized or characterized. Bladder: Decompressed. IMPRESSION: 1. Probable caliectasis in the right kidney. Renal cysts are considered less likely. No gross hydronephrosis with no pelviectasis. 2. The left kidney is not well visualized. 3. The bladder is poorly evaluated due to lack of distention. 4. A small amount of free fluid is seen in the right upper quadrant and pelvis, nonspecific. Electronically Signed   By: Dorise Bullion III M.D   On: 10/26/2017 21:06   Dg Chest Port 1 View  Result Date: 10/16/2017 CLINICAL DATA:  Acute kidney injury. History of metastatic cancer  and ureteral stents. EXAM: PORTABLE CHEST 1 VIEW COMPARISON:  Chest radiograph Mar 17, 2015 FINDINGS: Cardiac silhouette is mildly enlarged. Mediastinal silhouette is nonsuspicious. Moderate RIGHT and small LEFT pleural effusions with underlying consolidation. No pneumothorax. Soft tissue planes included osseous structures are nonsuspicious. IMPRESSION: Moderate RIGHT and small LEFT pleural effusions with underlying consolidation. Recommend follow-up chest radiograph after treatment to exclude underlying mass. Mild cardiomegaly. Electronically Signed   By: Elon Alas M.D.   On: 10/16/2017 20:02     CT films and report and labs reviewed.  Nephrology note reviewed.       LOS: 2 days    Sharon [name] 10/17/2017 [phone] ID: Sharon [name], female   DOB: 23-Jun-1932, 83 y.o.   MRN: [id number]

## 2017-10-17 NOTE — Progress Notes (Signed)
PROGRESS NOTE  Sharon [name] XHF:[id number] DOB: February 08, 1932 DOA: 11/05/2017 PCP: Ann Held, DO  HPI/Recap of past 24 hours: HPI from Dr Sharon [name] on 10/24/2017 Sharon [name] is a 83 y.o. female with medical history significant of hypertension, hypothyroidism, iron deficiency anemia, newly diagnosed metastatic urothelial cancer, s/p of bilateral ureter stent placement due to obstruction, who presents with decreased urine output. Per patient's son, patient was recently hospitalized at [facility] on 11/18, found to have metastasized urothelial cancer (to liver and lumbar spine) after presenting with ARF. During that admission patient had bilateral ureter stent placement due to obstruction and requested further care be done here in [place]. As per son, for the past 24hrs prior to arrival, pt was found to have decreased urine output, she only had a few drops of urine. Patient denies symptoms of UTI. No nausea, vomiting, diarrhea or abdominal pain, chest pain, SOB. Pt reports worsening generalized weakness, bilateral leg edema. In the ED, pt was found to have uremia with potassium 6.0, bicarbonate 16, creatinine 8.39, BUN 95, sodium 119, WBC 13.7. Patient admitted to telemetry bed for further management. Nephrology and Urology were consulted.  Today, pt denied any new complains. Denies any chest pain, SOB, not in any resp distress, abdominal pain, N/V/D/C, fever/chills. Of note, pt is very hard of hearing.   Assessment/Plan: Principal Problem:   Uremia Active Problems:   Hypothyroidism   Essential hypertension   Iron deficiency anemia   Hyponatremia   Hyperkalemia   Metabolic acidosis   Hypocalcemia   Urothelial cancer (HCC)  #Acute renal failure/Uremic: Likely due to Hydronephrosis from bilateral stent failure Pt s/p of bilateral stent placement in 11/18 at OSH   Renal US showed probable caliectasis in the right kidney, but no gross hydronephrosis with no  pelviectasis CT abd/pelvis: Mild bilateral hydronephrosis, right> left. Left kidney is atrophic. Large bilateral pleural effusions with bilateral lower lobe atelectasis or pneumonia. Numerous lesions throughout the liver most suggestive of metastatic disease. Morphologic features of the liver suggests cirrhosis. Extensive bony metastatic disease in the spine and pelvis compatible with sclerotic bone metastases Nephrology consulted, appreciate rec Urology consulted, rec foley placement and repeat US which showed persistent hydronephrosis, worse on the R>L Pt scheduled for R percutaneous nephrostomy 10/17/17 Monitor closely, daily bmp  #Hyponatremia: Asymptomatic, no met encephalopathy, likely chronic as well Likely due to uremia Gentle hydration with IVF @ 50 mL per hour Hold home Prinzide and Lasix Daily bmp  #Hyperkalemia: K=6.0. No T peaking on EKG on admission Resolving S/P Kayexalate 45 g 1, calcium gluconate, D50 and insulin Daily BMP  #Hypocalcemia: Calcium 6.2 on admission S/P 2 g of calcium Gluconate Start calcitriol and PhosLo Daily BMP  #Metabolic acidosis Due to uremia IV fluid as above  #Metastatic Urothelial cancer (HCC) Newly diagnosed on 11/18 S/p bilateral ureteral stent placement due to ARF 2/2 obstruction Spoke with oncall oncologist Dr Sharon [name], will plan on scheduling pt as an outpatient Palliative consult placed  #HTN: Stable Continue home Metoprolol, hold Prinzide and Lasix due to uremia and hyponatremia -IV hydralazine prn  #Hypothyroidism: Last TSH was 1.49 on 09/25/12 -Continue home Synthroid  #Iron deficiency anemia: -Continue to iron supplement    Code Status: Full  Family Communication: Spoke with son and husband about plan of management  Disposition Plan: Home once stable   Consultants:  Urology  Nephrology  IR  Procedures:  IR Right Nephrostomy tube placement on 10/17/17  Antimicrobials:  None  DVT prophylaxis:  Heparin   Objective: Vitals:   10/16/17 2240 10/17/17 0501 10/17/17 0826 10/17/17 1522  BP: (!) 135/45 (!) 130/39 (!) 121/45 135/60  Pulse: 61 (!) 114 72 66  Resp:  16  18  Temp:  97.6 F (36.4 C)    TempSrc:  Oral    SpO2: 93% 97%  95%  Weight:  70.5 kg (155 lb 6.8 oz)    Height:        Intake/Output Summary (Last 24 hours) at 10/17/2017 1555 Last data filed at 10/17/2017 1350 Gross per 24 hour  Intake 1319.17 ml  Output 100 ml  Net 1219.17 ml   Filed Weights   10/16/17 0300 10/16/17 0531 10/17/17 0501  Weight: 68.4 kg (150 lb 12.7 oz) 69.5 kg (153 lb 3.5 oz) 70.5 kg (155 lb 6.8 oz)    Exam:   General:  Alert, awake, oriented, in no distress  Cardiovascular: S1-S2 present, no added sound  Respiratory: Bibasilar crackles noted  Abdomen: Soft, non tender, non distended, BS present  Musculoskeletal: 2+ pitting LE edema bilaterally  Skin: Normal  Psychiatry: Normal mood   Data Reviewed: CBC: Recent Labs  Lab 10/20/2017 1758 10/16/17 0445 10/17/17 0341  WBC 13.7* 11.7* 9.7  NEUTROABS  --   --  8.3*  HGB 9.9* 9.0* 9.3*  HCT 27.7* 26.3* 26.6*  MCV 76.7* 77.6* 75.8*  PLT 253 265 546   Basic Metabolic Panel: Recent Labs  Lab 11/04/2017 1758 10/16/17 0005 10/16/17 0445 10/16/17 1003 10/17/17 0341  NA 119* 118* 119*  --  123*  K 6.0* 5.7* 5.4*  --  5.2*  CL 84* 84* 86*  --  88*  CO2 16* 15* 16*  --  17*  GLUCOSE 125* 94 94  --  62*  BUN 95* 96* 94*  --  105*  CREATININE 8.39* 8.67* 8.75*  --  9.15*  CALCIUM 6.2* 6.0* 6.3*  --  6.4*  MG  --   --   --  1.9  --   PHOS  --   --   --  5.6*  --    GFR: Estimated Creatinine Clearance: 4.4 mL/min (A) (by C-G formula based on SCr of 9.15 mg/dL (H)). Liver Function Tests: No results for input(s): AST, ALT, ALKPHOS, BILITOT, PROT, ALBUMIN in the last 168 hours. No results for input(s): LIPASE, AMYLASE in the last 168 hours. No results for input(s): AMMONIA in the last 168 hours. Coagulation Profile: Recent  Labs  Lab 10/17/17 1409  INR 1.28   Cardiac Enzymes: No results for input(s): CKTOTAL, CKMB, CKMBINDEX, TROPONINI in the last 168 hours. BNP (last 3 results) No results for input(s): PROBNP in the last 8760 hours. HbA1C: No results for input(s): HGBA1C in the last 72 hours. CBG: Recent Labs  Lab 10/16/17 0750 10/17/17 0811 10/17/17 0858 10/17/17 1209  GLUCAP 83 57* 130* 79   Lipid Profile: No results for input(s): CHOL, HDL, LDLCALC, TRIG, CHOLHDL, LDLDIRECT in the last 72 hours. Thyroid Function Tests: No results for input(s): TSH, T4TOTAL, FREET4, T3FREE, THYROIDAB in the last 72 hours. Anemia Panel: No results for input(s): VITAMINB12, FOLATE, FERRITIN, TIBC, IRON, RETICCTPCT in the last 72 hours. Urine analysis:    Component Value Date/Time   COLORURINE RED (A) 10/16/2017 1053   APPEARANCEUR TURBID (A) 10/16/2017 1053   LABSPEC  10/16/2017 1053    TEST NOT REPORTED DUE TO COLOR INTERFERENCE OF URINE PIGMENT   PHURINE  10/16/2017 1053    TEST NOT REPORTED DUE TO COLOR INTERFERENCE  OF URINE PIGMENT   GLUCOSEU (A) 10/16/2017 1053    TEST NOT REPORTED DUE TO COLOR INTERFERENCE OF URINE PIGMENT   HGBUR (A) 10/16/2017 1053    TEST NOT REPORTED DUE TO COLOR INTERFERENCE OF URINE PIGMENT   HGBUR negative 08/24/2010 0824   BILIRUBINUR (A) 10/16/2017 1053    TEST NOT REPORTED DUE TO COLOR INTERFERENCE OF URINE PIGMENT   BILIRUBINUR Neg 09/25/2012 1453   KETONESUR (A) 10/16/2017 1053    TEST NOT REPORTED DUE TO COLOR INTERFERENCE OF URINE PIGMENT   PROTEINUR (A) 10/16/2017 1053    TEST NOT REPORTED DUE TO COLOR INTERFERENCE OF URINE PIGMENT   UROBILINOGEN 0.2 09/25/2012 1453   UROBILINOGEN 0.2 08/24/2010 0824   NITRITE (A) 10/16/2017 1053    TEST NOT REPORTED DUE TO COLOR INTERFERENCE OF URINE PIGMENT   LEUKOCYTESUR (A) 10/16/2017 1053    TEST NOT REPORTED DUE TO COLOR INTERFERENCE OF URINE PIGMENT   Sepsis Labs: _0 (procalcitonin:4,lacticidven:4)  )No results  found for this or any previous visit (from the past 240 hour(s)).    Studies: US Renal  Result Date: 10/17/2017 CLINICAL DATA:  Bilateral hydronephrosis EXAM: RENAL / URINARY TRACT ULTRASOUND COMPLETE COMPARISON:  Abdominal CT from yesterday FINDINGS: Right Kidney: Length: 13 cm. Echogenic with moderate hydronephrosis. The known ureteral stent is difficult to visualize. No mass lesion. Left Kidney: Length: 9 cm, which is overestimated relative to abdominal CT from yesterday (8cm). Echogenic cortex with diffuse cortical thinning. Mild hydronephrosis. Known ureteral stent is difficult to visualize. Bladder: Decompressed. Trace ascites.  Known pleural effusions. IMPRESSION: 1. Moderate right and mild left hydronephrosis that is stable from CT yesterday. 2. Decompressed urinary bladder. 3. Chronic medical renal disease with asymmetric left renal atrophy. 4. Trace ascites. Electronically Signed   By: Monte Fantasia M.D.   On: 10/17/2017 08:35   Dg Chest Port 1 View  Result Date: 10/16/2017 CLINICAL DATA:  Acute kidney injury. History of metastatic cancer and ureteral stents. EXAM: PORTABLE CHEST 1 VIEW COMPARISON:  Chest radiograph Mar 17, 2015 FINDINGS: Cardiac silhouette is mildly enlarged. Mediastinal silhouette is nonsuspicious. Moderate RIGHT and small LEFT pleural effusions with underlying consolidation. No pneumothorax. Soft tissue planes included osseous structures are nonsuspicious. IMPRESSION: Moderate RIGHT and small LEFT pleural effusions with underlying consolidation. Recommend follow-up chest radiograph after treatment to exclude underlying mass. Mild cardiomegaly. Electronically Signed   By: Elon Alas M.D.   On: 10/16/2017 20:02    Scheduled Meds: . calcitRIOL  0.25 mcg Oral Daily  . calcium acetate  1,334 mg Oral TID WC  . calcium-vitamin D  1 tablet Oral BID  . cholecalciferol  1,000 Units Oral Daily  . fentaNYL      . ferrous gluconate  325 mg Oral Q breakfast  . [START ON  10/18/2017] heparin  5,000 Units Subcutaneous Q8H  . hydrocortisone   Rectal BID  . iopamidol      . levothyroxine  25 mcg Oral QAC breakfast  . lidocaine      . metoprolol tartrate  12.5 mg Oral BID  . midazolam      . multivitamin with minerals  1 tablet Oral Daily  . vitamin B-12  1,000 mcg Oral Daily    Continuous Infusions: . sodium chloride 30 mL/hr (10/17/17 1525)  . ciprofloxacin    . ciprofloxacin    . dextrose 5 % and 0.9% NaCl 50 mL/hr at 10/17/17 0915     LOS: 2 days     Alma Friendly, MD [facility]  If 7PM-7AM, please contact night-coverage [email or web address] Password [email or web address] 10/17/2017, 3:55 PM

## 2017-10-17 NOTE — Sedation Documentation (Signed)
O2 d/c'd 

## 2017-10-17 NOTE — Progress Notes (Signed)
Subjective:  Appreciate Urology help- U/S with persistent hydro esp on the right- now perc tube has been ordered.  Crt up-  minimal UOP   Objective Vital signs in last 24 hours: Vitals:   10/16/17 2239 10/16/17 2240 10/17/17 0501 10/17/17 0826  BP: (!) 129/50 (!) 135/45 (!) 130/39 (!) 121/45  Pulse: 64 61 (!) 114 72  Resp: 12  16   Temp: 98.4 F (36.9 C)  97.6 F (36.4 C)   TempSrc: Oral  Oral   SpO2: 93% 93% 97%   Weight:   70.5 kg (155 lb 6.8 oz)   Height:       Weight change: 7.904 kg (17 lb 6.8 oz)  Intake/Output Summary (Last 24 hours) at 10/17/2017 1212 Last data filed at 10/17/2017 1610 Gross per 24 hour  Intake 1319.17 ml  Output 100 ml  Net 1219.17 ml    Assessment/ Plan: Pt is a 83 y.o. yo female who was admitted on 10/19/2017 with weakness- recent dx of metastatic urothelial CA- s/p bilat ureteral stents but now with cont hydro and AKI  Assessment/Plan: 1. Acute renal failure - recurrent issue w/ recent bilat ureteral stent placement in [place], [place] 3 wks ago today.  She is not dehydrated, rather vol overloaded.  BP's are good.  Urology is evaluating.  CT shows a significantly atrophic L kidney , may not be functional. Right kidney appears blocked. No absolute indication for acute dialysis and would hesitate to use in this setting as do not know prognosis.  Needs nephrostomy tube- has been ordered per IR  2. HTN/volume- if anything overloaded- BP is fine - low dose lopressor 3. Anemia- stable in the 9's 4. Secondary hyperparathyroidism- is on phoslo and calcitriol as well as some IV for low calcium - corrected is probably OK    Branden Vine A    Labs: Basic Metabolic Panel: Recent Labs  Lab 10/16/17 0005 10/16/17 0445 10/16/17 1003 10/17/17 0341  NA 118* 119*  --  123*  K 5.7* 5.4*  --  5.2*  CL 84* 86*  --  88*  CO2 15* 16*  --  17*  GLUCOSE 94 94  --  62*  BUN 96* 94*  --  105*  CREATININE 8.67* 8.75*  --  9.15*  CALCIUM 6.0* 6.3*  --  6.4*   PHOS  --   --  5.6*  --    Liver Function Tests: No results for input(s): AST, ALT, ALKPHOS, BILITOT, PROT, ALBUMIN in the last 168 hours. No results for input(s): LIPASE, AMYLASE in the last 168 hours. No results for input(s): AMMONIA in the last 168 hours. CBC: Recent Labs  Lab 11/02/2017 1758 10/16/17 0445 10/17/17 0341  WBC 13.7* 11.7* 9.7  NEUTROABS  --   --  8.3*  HGB 9.9* 9.0* 9.3*  HCT 27.7* 26.3* 26.6*  MCV 76.7* 77.6* 75.8*  PLT 253 265 244   Cardiac Enzymes: No results for input(s): CKTOTAL, CKMB, CKMBINDEX, TROPONINI in the last 168 hours. CBG: Recent Labs  Lab 10/16/17 0750 10/17/17 0811 10/17/17 0858 10/17/17 1209  GLUCAP 83 57* 130* 79    Iron Studies: No results for input(s): IRON, TIBC, TRANSFERRIN, FERRITIN in the last 72 hours. Studies/Results: Ct Abdomen Pelvis Wo Contrast  Result Date: 10/16/2017 CLINICAL DATA:  Hydronephrosis. History of bilateral renal stents through hydronephrosis. Worsening acute renal failure. EXAM: CT ABDOMEN AND PELVIS WITHOUT CONTRAST TECHNIQUE: Multidetector CT imaging of the abdomen and pelvis was performed following the standard protocol without IV contrast.  COMPARISON:  Ultrasound 10/30/2017 FINDINGS: Lower chest: Large bilateral pleural effusions with compressive atelectasis or pneumonia in the lower lobes. Heart is normal size. Hepatobiliary: Numerous ill-defined masses throughout the liver. There are morphologic changes of cirrhosis with nodular contours. Small gallstones layering in the gallbladder. Pancreas: No focal abnormality or ductal dilatation. Spleen: No focal abnormality.  Normal size. Adrenals/Urinary Tract: Bilateral ureteral stents are in place in the within the bladder. Mild bilateral hydronephrosis, right greater than left. Left kidney is atrophic. Stomach/Bowel: Sigmoid diverticulosis. No active diverticulitis. No evidence of bowel obstruction. Vascular/Lymphatic: Diffuse aortic and iliac calcifications. No  aneurysm or adenopathy. Reproductive: Prior hysterectomy.  No adnexal masses. Other: Small amount of free fluid in the pelvis and around the liver and spleen. No free air. Extensive edema throughout the subcutaneous soft tissues. Musculoskeletal: Extensive sclerotic lesions throughout the thoracic and lumbar spine and sacrum compatible with sclerotic metastases. IMPRESSION: Numerous lesions throughout the liver most suggestive of metastatic disease. Morphologic features of the liver suggests cirrhosis. Extensive bony metastatic disease in the spine and pelvis compatible with sclerotic bone metastases. Mild bilateral hydronephrosis, right greater than left. Left kidney is atrophic. Large bilateral pleural effusions with bilateral lower lobe atelectasis or pneumonia. Cholelithiasis. Sigmoid diverticulosis. Electronically Signed   By: Rolm Baptise M.D.   On: 10/16/2017 15:12   US Renal  Result Date: 10/17/2017 CLINICAL DATA:  Bilateral hydronephrosis EXAM: RENAL / URINARY TRACT ULTRASOUND COMPLETE COMPARISON:  Abdominal CT from yesterday FINDINGS: Right Kidney: Length: 13 cm. Echogenic with moderate hydronephrosis. The known ureteral stent is difficult to visualize. No mass lesion. Left Kidney: Length: 9 cm, which is overestimated relative to abdominal CT from yesterday (8cm). Echogenic cortex with diffuse cortical thinning. Mild hydronephrosis. Known ureteral stent is difficult to visualize. Bladder: Decompressed. Trace ascites.  Known pleural effusions. IMPRESSION: 1. Moderate right and mild left hydronephrosis that is stable from CT yesterday. 2. Decompressed urinary bladder. 3. Chronic medical renal disease with asymmetric left renal atrophy. 4. Trace ascites. Electronically Signed   By: Monte Fantasia M.D.   On: 10/17/2017 08:35   US Renal  Result Date: 10/19/2017 CLINICAL DATA:  Renal failure. EXAM: RENAL / URINARY TRACT ULTRASOUND COMPLETE COMPARISON:  None. FINDINGS: Right Kidney: Length: 13.2 cm.  Rounded regions of fluid in the left kidney are favored to represent caliectasis. Renal cysts considered less likely. No pelviectasis. No solid mass noted. Left Kidney: Length: 11.5 cm. The left kidney is not well visualized or characterized. Bladder: Decompressed. IMPRESSION: 1. Probable caliectasis in the right kidney. Renal cysts are considered less likely. No gross hydronephrosis with no pelviectasis. 2. The left kidney is not well visualized. 3. The bladder is poorly evaluated due to lack of distention. 4. A small amount of free fluid is seen in the right upper quadrant and pelvis, nonspecific. Electronically Signed   By: Dorise Bullion III M.D   On: 10/22/2017 21:06   Dg Chest Port 1 View  Result Date: 10/16/2017 CLINICAL DATA:  Acute kidney injury. History of metastatic cancer and ureteral stents. EXAM: PORTABLE CHEST 1 VIEW COMPARISON:  Chest radiograph Mar 17, 2015 FINDINGS: Cardiac silhouette is mildly enlarged. Mediastinal silhouette is nonsuspicious. Moderate RIGHT and small LEFT pleural effusions with underlying consolidation. No pneumothorax. Soft tissue planes included osseous structures are nonsuspicious. IMPRESSION: Moderate RIGHT and small LEFT pleural effusions with underlying consolidation. Recommend follow-up chest radiograph after treatment to exclude underlying mass. Mild cardiomegaly. Electronically Signed   By: Elon Alas M.D.   On: 10/16/2017 20:02  Medications: Infusions: . dextrose 5 % and 0.9% NaCl 50 mL/hr at 10/17/17 0915    Scheduled Medications: . calcitRIOL  0.25 mcg Oral Daily  . calcium acetate  1,334 mg Oral TID WC  . calcium-vitamin D  1 tablet Oral BID  . cholecalciferol  1,000 Units Oral Daily  . ferrous gluconate  325 mg Oral Q breakfast  . heparin  5,000 Units Subcutaneous Q8H  . hydrocortisone   Rectal BID  . levothyroxine  25 mcg Oral QAC breakfast  . metoprolol tartrate  12.5 mg Oral BID  . multivitamin with minerals  1 tablet Oral Daily  .  vitamin B-12  1,000 mcg Oral Daily    have reviewed scheduled and prn medications.  Physical Exam: General: somnolent but arousable- husband and son at bedside Heart: RRR Lungs: mostly clear Abdomen: soft, non tender Extremities: pitting edema    10/17/2017,12:12 PM  LOS: 2 days

## 2017-10-17 NOTE — Progress Notes (Addendum)
Pt still drowsy from anesthesia given during her procedure, hardly stays awake and follow instruction, it took a long time to be able to swallow the little 12.5mg  metoprolol, all other remaining scheduled oral medicine are not given, on call physician Schoor has been notified

## 2017-10-17 NOTE — Progress Notes (Signed)
CRITICAL VALUE ALERT  Critical Value:  Calcium 6.4  Date & Time Notied:  10/17/17 @ 05:40  Provider Notified: on call hospitalist   Orders Received/Actions taken: continue to monitor

## 2017-10-17 NOTE — Plan of Care (Signed)
  Progressing Pain Managment: General experience of comfort will improve 10/17/2017 1054 - Progressing by Marice Potter, RN   Not Progressing Elimination: Will not experience complications related to urinary retention 10/17/2017 1054 - Not Progressing by Marice Potter, RN

## 2017-10-17 NOTE — Progress Notes (Signed)
Pt blood sugar 57. Pt is NPO and no prn orders. MD paged with info. Awaiting orders or call back

## 2017-10-17 NOTE — Procedures (Signed)
Interventional Radiology Procedure Note  Procedure: Right percutaneous nephrostomy tube placement, 33L  Complications: None  Estimated Blood Loss: None  Recommendations: - Tube to bag drainage - Return to IR for tube check/change in 6-8 wks  Signed,  Criselda Peaches, MD

## 2017-10-17 NOTE — Sedation Documentation (Signed)
In IR 1, moved to table in prone position.  Prepped and ready to proceed.  O2 2l/[place] applied

## 2017-10-17 NOTE — Consult Note (Signed)
Chief Complaint: Patient was seen in consultation today for right percutaneous nephrostomy drain placement Chief Complaint  Patient presents with  . Urinary Retention  . Weakness  . Cancer Patient   at the request of Dr Jeffie Pollock  Supervising Physician: Jacqulynn Cadet  Patient Status: [facility] - In-pt  History of Present Illness: SPRUHA WEIGHT is a 83 y.o. female   [employer] Ca Bilateral Hydronephrosis Recent stents placed in [place] , [place]- approx 3 weeks ago Rising creatinine US Renal today: IMPRESSION: 1. Moderate right and mild left hydronephrosis that is stable from CT yesterday. 2. Decompressed urinary bladder. 3. Chronic medical renal disease with asymmetric left renal atrophy. 4. Trace ascites.  Dr Jeffie Pollock has seen pt and requesting Right percutaneous nephrostomy placement Dr Laurence Ferrari has reviewed imaging and approves procedure    Past Medical History:  Diagnosis Date  . Acute renal insufficiency   . CHF (congestive heart failure) ([place])   . Hypertension   . Kidney cancer, primary, with metastasis from kidney to other site [facility])   . Lymphoma ([place])   . Osteopenia   . Thyroid disease     Past Surgical History:  Procedure Laterality Date  . ABDOMINAL HYSTERECTOMY    . renal stents    . TONSILLECTOMY      Allergies: Patient has no known allergies.  Medications: Prior to Admission medications   Medication Sig Start Date End Date Taking? Authorizing Provider  Calcium Carbonate-Vitamin D (CALCIUM + D) 600-200 MG-UNIT TABS Take 2 tablets by mouth daily.     Yes [provider]  cholecalciferol (VITAMIN D) 1000 UNITS tablet Take 1,000 Units by mouth daily.     Yes [provider]  cyclobenzaprine (FLEXERIL) 5 MG tablet Take 5 mg by mouth 3 (three) times daily as needed for muscle spasms.   Yes [provider]  ferrous gluconate (FERGON) 325 MG tablet Take 325 mg by mouth daily with breakfast.     Yes [provider]  Flaxseed, Linseed, (FLAX SEED OIL) 1000 MG CAPS Take 1 capsule by mouth daily.     Yes [provider]  furosemide (LASIX) 40 MG tablet Take 40 mg by mouth daily.   Yes [provider]  levothyroxine (SYNTHROID, LEVOTHROID) 25 MCG tablet 1 po qd 09/25/12  Yes Roma Schanz R, DO  metoprolol tartrate (LOPRESSOR) 25 MG tablet Take 25 mg by mouth 2 (two) times daily.   Yes [provider]  Multiple Vitamin (MULTIVITAMIN) tablet Take 1 tablet by mouth daily.     Yes [provider]  vitamin B-12 (CYANOCOBALAMIN) 1000 MCG tablet Take 1,000 mcg by mouth daily.     Yes [provider]  zoster vaccine live, PF, (ZOSTAVAX) 40086 UNT/0.65ML injection Inject 19,400 Units into the skin once. 09/25/12  Yes Roma Schanz R, DO  alendronate (FOSAMAX) 70 MG tablet Take with a full glass of water on an empty stomach. Patient not taking: Reported on 10/26/2017 09/25/12   Carollee Herter, Alferd Apa, DO  ciprofloxacin (CIPRO) 250 MG tablet Take 1 tablet (250 mg total) by mouth 2 (two) times daily. Patient not taking: Reported on 11/01/2017 09/27/12   Carollee Herter, Alferd Apa, DO  lisinopril-hydrochlorothiazide (PRINZIDE,ZESTORETIC) 10-12.5 MG per tablet 1 tab by mouth daily--Office visit due now Patient not taking: Reported on 10/29/2017 09/21/13   Ann Held, DO     Family History  Problem Relation Age of Onset  . Dementia Unknown   . COPD Unknown   .  Hip fracture Unknown     Social History   Socioeconomic History  . Marital status: Married    Spouse name: None  . Number of children: None  . Years of education: None  . Highest education level: None  Social Needs  . Financial resource strain: None  . Food insecurity - worry: None  . Food insecurity - inability: None  . Transportation needs - medical: None  . Transportation needs - non-medical: None  Occupational History  . None  Tobacco Use  . Smoking status: Never Smoker  .  Smokeless tobacco: Never Used  Substance and Sexual Activity  . Alcohol use: No  . Drug use: No  . Sexual activity: Not Currently    Partners: Male  Other Topics Concern  . None  Social History Narrative  . None    Review of Systems: A 12 point ROS discussed and pertinent positives are indicated in the HPI above.  All other systems are negative.  Review of Systems  Constitutional: Positive for activity change, appetite change and fatigue.  Respiratory: Negative for shortness of breath.   Genitourinary: Positive for decreased urine volume.  Neurological: Positive for weakness.  Psychiatric/Behavioral: Positive for confusion. Negative for behavioral problems.    Vital Signs: BP (!) 121/45   Pulse 72   Temp 97.6 F (36.4 C) (Oral)   Resp 16   Ht 5\' 3"  (1.6 m)   Wt 155 lb 6.8 oz (70.5 kg)   SpO2 97%   BMI 27.53 kg/m   Physical Exam  Cardiovascular: Normal rate.  Pulmonary/Chest: Effort normal. She has wheezes.  Abdominal: Soft.  Musculoskeletal: Normal range of motion.  Follows commands- moves all 4s  Neurological: She is alert.  Skin: Skin is warm.  Psychiatric:  Pt is lethargic Weak Ill appearing Husband at bedside signed consent  Nursing note and vitals reviewed.   Imaging: Ct Abdomen Pelvis Wo Contrast  Result Date: 10/16/2017 CLINICAL DATA:  Hydronephrosis. History of bilateral renal stents through hydronephrosis. Worsening acute renal failure. EXAM: CT ABDOMEN AND PELVIS WITHOUT CONTRAST TECHNIQUE: Multidetector CT imaging of the abdomen and pelvis was performed following the standard protocol without IV contrast. COMPARISON:  Ultrasound 11/13/2017 FINDINGS: Lower chest: Large bilateral pleural effusions with compressive atelectasis or pneumonia in the lower lobes. Heart is normal size. Hepatobiliary: Numerous ill-defined masses throughout the liver. There are morphologic changes of cirrhosis with nodular contours. Small gallstones layering in the gallbladder.  Pancreas: No focal abnormality or ductal dilatation. Spleen: No focal abnormality.  Normal size. Adrenals/Urinary Tract: Bilateral ureteral stents are in place in the within the bladder. Mild bilateral hydronephrosis, right greater than left. Left kidney is atrophic. Stomach/Bowel: Sigmoid diverticulosis. No active diverticulitis. No evidence of bowel obstruction. Vascular/Lymphatic: Diffuse aortic and iliac calcifications. No aneurysm or adenopathy. Reproductive: Prior hysterectomy.  No adnexal masses. Other: Small amount of free fluid in the pelvis and around the liver and spleen. No free air. Extensive edema throughout the subcutaneous soft tissues. Musculoskeletal: Extensive sclerotic lesions throughout the thoracic and lumbar spine and sacrum compatible with sclerotic metastases. IMPRESSION: Numerous lesions throughout the liver most suggestive of metastatic disease. Morphologic features of the liver suggests cirrhosis. Extensive bony metastatic disease in the spine and pelvis compatible with sclerotic bone metastases. Mild bilateral hydronephrosis, right greater than left. Left kidney is atrophic. Large bilateral pleural effusions with bilateral lower lobe atelectasis or pneumonia. Cholelithiasis. Sigmoid diverticulosis. Electronically Signed   By: Rolm Baptise M.D.   On: 10/16/2017 15:12   US Renal  Result Date: 10/17/2017 CLINICAL DATA:  Bilateral hydronephrosis EXAM: RENAL / URINARY TRACT ULTRASOUND COMPLETE COMPARISON:  Abdominal CT from yesterday FINDINGS: Right Kidney: Length: 13 cm. Echogenic with moderate hydronephrosis. The known ureteral stent is difficult to visualize. No mass lesion. Left Kidney: Length: 9 cm, which is overestimated relative to abdominal CT from yesterday (8cm). Echogenic cortex with diffuse cortical thinning. Mild hydronephrosis. Known ureteral stent is difficult to visualize. Bladder: Decompressed. Trace ascites.  Known pleural effusions. IMPRESSION: 1. Moderate right and  mild left hydronephrosis that is stable from CT yesterday. 2. Decompressed urinary bladder. 3. Chronic medical renal disease with asymmetric left renal atrophy. 4. Trace ascites. Electronically Signed   By: Monte Fantasia M.D.   On: 10/17/2017 08:35   US Renal  Result Date: 11/02/2017 CLINICAL DATA:  Renal failure. EXAM: RENAL / URINARY TRACT ULTRASOUND COMPLETE COMPARISON:  None. FINDINGS: Right Kidney: Length: 13.2 cm. Rounded regions of fluid in the left kidney are favored to represent caliectasis. Renal cysts considered less likely. No pelviectasis. No solid mass noted. Left Kidney: Length: 11.5 cm. The left kidney is not well visualized or characterized. Bladder: Decompressed. IMPRESSION: 1. Probable caliectasis in the right kidney. Renal cysts are considered less likely. No gross hydronephrosis with no pelviectasis. 2. The left kidney is not well visualized. 3. The bladder is poorly evaluated due to lack of distention. 4. A small amount of free fluid is seen in the right upper quadrant and pelvis, nonspecific. Electronically Signed   By: Dorise Bullion III M.D   On: 10/26/2017 21:06   Dg Chest Port 1 View  Result Date: 10/16/2017 CLINICAL DATA:  Acute kidney injury. History of metastatic cancer and ureteral stents. EXAM: PORTABLE CHEST 1 VIEW COMPARISON:  Chest radiograph Mar 17, 2015 FINDINGS: Cardiac silhouette is mildly enlarged. Mediastinal silhouette is nonsuspicious. Moderate RIGHT and small LEFT pleural effusions with underlying consolidation. No pneumothorax. Soft tissue planes included osseous structures are nonsuspicious. IMPRESSION: Moderate RIGHT and small LEFT pleural effusions with underlying consolidation. Recommend follow-up chest radiograph after treatment to exclude underlying mass. Mild cardiomegaly. Electronically Signed   By: Elon Alas M.D.   On: 10/16/2017 20:02    Labs:  CBC: Recent Labs    10/19/2017 1758 10/16/17 0445 10/17/17 0341  WBC 13.7* 11.7* 9.7  HGB  9.9* 9.0* 9.3*  HCT 27.7* 26.3* 26.6*  PLT 253 265 244    COAGS: No results for input(s): INR, APTT in the last 8760 hours.  BMP: Recent Labs    10/22/2017 1758 10/16/17 0005 10/16/17 0445 10/17/17 0341  NA 119* 118* 119* 123*  K 6.0* 5.7* 5.4* 5.2*  CL 84* 84* 86* 88*  CO2 16* 15* 16* 17*  GLUCOSE 125* 94 94 62*  BUN 95* 96* 94* 105*  CALCIUM 6.2* 6.0* 6.3* 6.4*  CREATININE 8.39* 8.67* 8.75* 9.15*  GFRNONAA 4* 4* 4* 3*  GFRAA 4* 4* 4* 4*    LIVER FUNCTION TESTS: No results for input(s): BILITOT, AST, ALT, ALKPHOS, PROT, ALBUMIN in the last 8760 hours.  TUMOR MARKERS: No results for input(s): AFPTM, CEA, CA199, CHROMGRNA in the last 8760 hours.  Assessment and Plan:  Metastatic urothelial Ca Hydronephrosis; B stents placed 3 weeks ago Worsening Creatinine level Rt Hydronephrosis Now scheduled for percutaneous nephrostomy drain placement Risks and benefits of right PCN were discussed with the patient and husband including, but not limited to, infection, bleeding, significant bleeding causing loss or decrease in renal function or damage to adjacent structures. All of the their questions  were answered, patient and husband is agreeable to proceed Consent signed and in chart  Thank you for this interesting consult.  I greatly enjoyed meeting Wray Kearns and look forward to participating in their care.  A copy of this report was sent to the requesting provider on this date.  Electronically Signed: Lavonia Drafts, PA-C 10/17/2017, 1:24 PM   I spent a total of 40 Minutes    in face to face in clinical consultation, greater than 50% of which was counseling/coordinating care for right percutaneous nephrostomy drain

## 2017-10-17 NOTE — Sedation Documentation (Signed)
Patient is resting comfortably. 

## 2017-10-18 ENCOUNTER — Encounter (HOSPITAL_COMMUNITY): Payer: Self-pay | Admitting: Family Medicine

## 2017-10-18 ENCOUNTER — Telehealth: Payer: Self-pay | Admitting: Oncology

## 2017-10-18 DIAGNOSIS — N179 Acute kidney failure, unspecified: Secondary | ICD-10-CM

## 2017-10-18 DIAGNOSIS — Z515 Encounter for palliative care: Secondary | ICD-10-CM

## 2017-10-18 DIAGNOSIS — Z7189 Other specified counseling: Secondary | ICD-10-CM

## 2017-10-18 DIAGNOSIS — N19 Unspecified kidney failure: Secondary | ICD-10-CM

## 2017-10-18 DIAGNOSIS — E871 Hypo-osmolality and hyponatremia: Secondary | ICD-10-CM

## 2017-10-18 DIAGNOSIS — C7910 Secondary malignant neoplasm of unspecified urinary organs: Secondary | ICD-10-CM

## 2017-10-18 LAB — CBC WITH DIFFERENTIAL/PLATELET
BASOS ABS: 0 10*3/uL (ref 0.0–0.1)
BASOS PCT: 0 %
EOS ABS: 0 10*3/uL (ref 0.0–0.7)
Eosinophils Relative: 0 %
HEMATOCRIT: 25.9 % — AB (ref 36.0–46.0)
HEMOGLOBIN: 9 g/dL — AB (ref 12.0–15.0)
Lymphocytes Relative: 10 %
Lymphs Abs: 0.9 10*3/uL (ref 0.7–4.0)
MCH: 26.7 pg (ref 26.0–34.0)
MCHC: 34.7 g/dL (ref 30.0–36.0)
MCV: 76.9 fL — ABNORMAL LOW (ref 78.0–100.0)
Monocytes Absolute: 0.4 10*3/uL (ref 0.1–1.0)
Monocytes Relative: 5 %
NEUTROS ABS: 7.7 10*3/uL (ref 1.7–7.7)
NEUTROS PCT: 85 %
Platelets: 212 10*3/uL (ref 150–400)
RBC: 3.37 MIL/uL — ABNORMAL LOW (ref 3.87–5.11)
RDW: 15.5 % (ref 11.5–15.5)
WBC: 9 10*3/uL (ref 4.0–10.5)

## 2017-10-18 LAB — BASIC METABOLIC PANEL
ANION GAP: 20 — AB (ref 5–15)
BUN: 100 mg/dL — ABNORMAL HIGH (ref 6–20)
CO2: 14 mmol/L — AB (ref 22–32)
Calcium: 6.6 mg/dL — ABNORMAL LOW (ref 8.9–10.3)
Chloride: 89 mmol/L — ABNORMAL LOW (ref 101–111)
Creatinine, Ser: 8.63 mg/dL — ABNORMAL HIGH (ref 0.44–1.00)
GFR calc Af Amer: 4 mL/min — ABNORMAL LOW (ref >60)
GFR, EST NON AFRICAN AMERICAN: 4 mL/min — AB (ref >60)
GLUCOSE: 85 mg/dL (ref 65–99)
POTASSIUM: 4.6 mmol/L (ref 3.5–5.1)
Sodium: 123 mmol/L — ABNORMAL LOW (ref 135–145)

## 2017-10-18 LAB — URINE CULTURE: Culture: <10000 — AB

## 2017-10-18 LAB — GLUCOSE, CAPILLARY: GLUCOSE-CAPILLARY: 133 mg/dL — AB (ref 65–99)

## 2017-10-18 MED ORDER — NYSTATIN 100000 UNIT/ML MT SUSP
5.0000 mL | Freq: Four times a day (QID) | OROMUCOSAL | Status: DC
  Administered 2017-10-18 – 2017-10-23 (×20): 500000 [IU] via OROMUCOSAL
  Administered 2017-10-24: 5 mL via OROMUCOSAL
  Administered 2017-10-24 (×2): 500000 [IU] via OROMUCOSAL
  Administered 2017-10-24 – 2017-10-25 (×2): 5 mL via OROMUCOSAL
  Administered 2017-10-25: 500000 [IU] via OROMUCOSAL
  Administered 2017-10-25: 100000 [IU] via OROMUCOSAL
  Administered 2017-10-25: 5 mL via OROMUCOSAL
  Administered 2017-10-26: 500000 [IU] via OROMUCOSAL
  Administered 2017-10-26: 100000 [IU] via OROMUCOSAL
  Administered 2017-10-26: 500000 [IU] via OROMUCOSAL
  Administered 2017-10-27: 5 mL via OROMUCOSAL
  Filled 2017-10-18 (×29): qty 5

## 2017-10-18 NOTE — Consult Note (Signed)
Consultation Note Date: 10/18/2017   Patient Name: Sharon [name]  DOB: October 02, 1932  MRN: [id number]  Age / Sex: 83 y.o., female  PCP: Ann Held, DO Referring Physician: Alma Friendly, MD  Reason for Consultation: Establishing goals of care  HPI/Patient Profile: 83 y.o. female  with past medical history of thyroid disease, osteopenia, lymphoma, hypertension, CHF, acute renal insufficiency, and new diagnosis of metastatic urothelial cancer s/p stent placement admitted on 10/25/2017 with decreased urine output. In ED, labs revealed K+ 6.0, Creat 8.39, BUN 95, NA 119, and WBC's 13.7. CT abd/pelvis reveals mild bilateral hydronephrosis (right > left), left kidney atrophy, and numerous lesions throughout the liver suggestive of metastatic disease. Also extensive bony mets in spine and pelvis. Acute renal failure likely due to hydronephrosis s/p bilateral stents on 11/18. Urology and Nephrology following. Palliative medicine consultation for goals of care.   Clinical Assessment and Goals of Care: I have reviewed medical records, discussed with care team, and met with patient and husband [name]) at bedside to discuss goals of care. [name] is awake, alert, and eating breakfast. Denies pain or discomfort. She is very hard of hearing. Her husband has periods of confusion and does not seem to have a good understanding of diagnoses and interventions. Both Mr. And [name] tell me to call their son, Sharon [name]. "He knows everything." They are agreeable with continued support from palliative throughout hospitalization.  Shortly after, spoke with son, Sharon [name] via telephone. Introduced Palliative Medicine as specialized medical care for people living with serious illness. It focuses on providing relief from the symptoms and stress of a serious illness. The goal is to improve quality of life for both the patient  and the family.  We discussed a brief life review of the patient. Married for 56 years. Three living adult sons. From [place] but Mr. And Mrs. Delcid enjoy spending time at [employer] at [facility]. 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 Sharon [name] speaks of her 13 day hospitalization in [place] with weakness and decreased appetite since.   Discussed hospital diagnoses and interventions. Sharon [name] has a good understanding of hospital diagnoses including new cancer diagnosis including liver and bone. He tells me his mom has been c/o of lower back pain but contributed to sore muscles from caring for his paralyzed brother.   Advanced directives, concepts specific to code status, and artifical feeding and hydration were discussed. The patient does not have a documented living will or designated HCPOA. Sharon [name] states "we need to get that in place." He speaks of wanting to know his mother's wishes if she were unable to make decisions for herself. Sharon [name] will be at the hospital tomorrow afternoon and is interested in further discussing advanced directives. Explained the process of completing AD packet with chaplain, notary, and witnesses.    I attempted to elicit values and goals of care important to the patient. Sharon [name] states "quality of life is important" and again mentions  his desire to complete AD's with his father and mother this hospitalization. At this point, Sharon [name] is "taking it one day at a time" and waiting on oncology input, which he understands will be outpatient. He is also hopeful her kidney functions will continue to improve with nephrostomy tube and foley.    Questions and concerns were addressed. Therapeutic listening and emotional support provided.    SUMMARY OF RECOMMENDATIONS    Continue FULL code/ FULL scope  Patient/family awaiting oncology input outpatient.   Son will be at the  hospital tomorrow afternoon. He is interested in completing advanced directive packet with his mother. Will further discuss with him 12/5.  PMT will continue to support patient/family through hospitalization.   Code Status/Advance Care Planning:  Full code-will further discuss with patient/son tomorrow.  Symptom Management:   Per attending  Palliative Prophylaxis:   Aspiration, Delirium Protocol, Frequent Pain Assessment, Oral Care and Turn Reposition  Additional Recommendations (Limitations, Scope, Preferences):  Full Scope Treatment  Psycho-social/Spiritual:   Desire for further Chaplaincy support: yes  Additional Recommendations: Caregiving  Support/Resources  Prognosis:   Unable to determine  Discharge Planning: To Be Determined      Primary Diagnoses: Present on Admission: . Essential hypertension . Hypothyroidism . Iron deficiency anemia . Uremia . Hyponatremia . Hyperkalemia . Metabolic acidosis . Hypocalcemia . Urothelial cancer ([place])   I have reviewed the medical record, interviewed the patient and family, and examined the patient. The following aspects are pertinent.  Past Medical History:  Diagnosis Date  . Acute renal insufficiency   . CHF (congestive heart failure) ([place])   . Hypertension   . Kidney cancer, primary, with metastasis from kidney to other site [facility])   . Lymphoma ([place])   . Osteopenia   . Thyroid disease    Social History   Socioeconomic History  . Marital status: Married    Spouse name: None  . Number of children: None  . Years of education: None  . Highest education level: None  Social Needs  . Financial resource strain: None  . Food insecurity - worry: None  . Food insecurity - inability: None  . Transportation needs - medical: None  . Transportation needs - non-medical: None  Occupational History  . None  Tobacco Use  . Smoking status: Never Smoker  . Smokeless tobacco: Never Used  Substance and Sexual Activity    . Alcohol use: No  . Drug use: No  . Sexual activity: Not Currently    Partners: Male  Other Topics Concern  . None  Social History Narrative  . None   Family History  Problem Relation Age of Onset  . Dementia Unknown   . COPD Unknown   . Hip fracture Unknown    Scheduled Meds: . calcitRIOL  0.25 mcg Oral Daily  . calcium acetate  1,334 mg Oral TID WC  . calcium-vitamin D  1 tablet Oral BID  . cholecalciferol  1,000 Units Oral Daily  . ferrous gluconate  325 mg Oral Q breakfast  . heparin  5,000 Units Subcutaneous Q8H  . hydrocortisone   Rectal BID  . levothyroxine  25 mcg Oral QAC breakfast  . metoprolol tartrate  12.5 mg Oral BID  . multivitamin with minerals  1 tablet Oral Daily  . vitamin B-12  1,000 mcg Oral Daily   Continuous Infusions:  PRN Meds:.acetaminophen, cyclobenzaprine, ondansetron, polyethylene glycol, zolpidem Medications Prior to Admission:  Prior to Admission medications   Medication Sig Start Date End Date Taking?  Authorizing Provider  Calcium Carbonate-Vitamin D (CALCIUM + D) 600-200 MG-UNIT TABS Take 2 tablets by mouth daily.     Yes [provider]  cholecalciferol (VITAMIN D) 1000 UNITS tablet Take 1,000 Units by mouth daily.     Yes [provider]  cyclobenzaprine (FLEXERIL) 5 MG tablet Take 5 mg by mouth 3 (three) times daily as needed for muscle spasms.   Yes [provider]  ferrous gluconate (FERGON) 325 MG tablet Take 325 mg by mouth daily with breakfast.     Yes [provider]  Flaxseed, Linseed, (FLAX SEED OIL) 1000 MG CAPS Take 1 capsule by mouth daily.     Yes [provider]  furosemide (LASIX) 40 MG tablet Take 40 mg by mouth daily.   Yes [provider]  levothyroxine (SYNTHROID, LEVOTHROID) 25 MCG tablet 1 po qd 09/25/12  Yes Roma Schanz R, DO  metoprolol tartrate (LOPRESSOR) 25 MG tablet Take 25 mg by mouth 2 (two) times daily.   Yes [provider]  Multiple  Vitamin (MULTIVITAMIN) tablet Take 1 tablet by mouth daily.     Yes [provider]  vitamin B-12 (CYANOCOBALAMIN) 1000 MCG tablet Take 1,000 mcg by mouth daily.     Yes [provider]  zoster vaccine live, PF, (ZOSTAVAX) 41583 UNT/0.65ML injection Inject 19,400 Units into the skin once. 09/25/12  Yes Roma Schanz R, DO  alendronate (FOSAMAX) 70 MG tablet Take with a full glass of water on an empty stomach. Patient not taking: Reported on 11/01/2017 09/25/12   Carollee Herter, Alferd Apa, DO  ciprofloxacin (CIPRO) 250 MG tablet Take 1 tablet (250 mg total) by mouth 2 (two) times daily. Patient not taking: Reported on 11/07/2017 09/27/12   Carollee Herter, Alferd Apa, DO  lisinopril-hydrochlorothiazide (PRINZIDE,ZESTORETIC) 10-12.5 MG per tablet 1 tab by mouth daily--Office visit due now Patient not taking: Reported on 11/02/2017 09/21/13   Roma Schanz R, DO   No Known Allergies Review of Systems  Constitutional: Positive for activity change and fatigue.  Genitourinary: Positive for difficulty urinating.  Neurological: Positive for weakness.   Physical Exam  Constitutional: She is cooperative. She appears ill.  HOH  HENT:  Head: Normocephalic and atraumatic.  Cardiovascular: Regular rhythm.  Pulmonary/Chest: Effort normal.  Genitourinary:  Genitourinary Comments: Right nephrostomy tube  Neurological: She is alert.  Oriented to person and place  Skin: Skin is warm and dry.  Psychiatric: Her speech is normal. She is inattentive.  Nursing note and vitals reviewed.  Vital Signs: BP (!) 115/43   Pulse 79   Temp 99.2 F (37.3 C) (Oral)   Resp 18   Ht _0  (1.6 m)   Wt 68.9 kg (151 lb 14.4 oz)   SpO2 92%   BMI 26.91 kg/m  Pain Assessment: No/denies pain   Pain Score: Asleep   SpO2: SpO2: 92 % O2 Device:SpO2: 92 % O2 Flow Rate: .   IO: Intake/output summary:   Intake/Output Summary (Last 24 hours) at 10/18/2017 1430 Last data filed at 10/18/2017  1200 Gross per 24 hour  Intake 1110 ml  Output 2800 ml  Net -1690 ml    LBM: Last BM Date: 10/16/17 Baseline Weight: Weight: 62.6 kg (138 lb) Most recent weight: Weight: 68.9 kg (151 lb 14.4 oz)     Palliative Assessment/Data: PPS 40%   Flowsheet Rows     Most Recent Value  Intake Tab  Referral Department  Hospitalist  Unit at Time of Referral  Med/Surg  Unit  Palliative Care Primary Diagnosis  Cancer  Palliative Care Type  New Palliative care  Date first seen by Palliative Care  10/18/17  Clinical Assessment  Palliative Performance Scale Score  40%  Psychosocial & Spiritual Assessment  Palliative Care Outcomes  Patient/Family meeting held?  Yes  Who was at the meeting?  patient and husband, son via telephone  Palliative Care Outcomes  Clarified goals of care, Provided psychosocial or spiritual support, ACP counseling assistance, Linked to palliative care logitudinal support      Time In: 0900  Time Out: 1010 Time Total: 63mn Greater than 50%  of this time was spent counseling and coordinating care related to the above assessment and plan.  Signed by:  MIhor Dow FNP-C Palliative Medicine Team  Phone: [phone]: [phone]  Please contact Palliative Medicine Team phone at [phone] questions and concerns.  For individual provider: See AShea Evans

## 2017-10-18 NOTE — Progress Notes (Signed)
Pt lt arm is edematous  Iv line has been removed and new one placed at the rt hand

## 2017-10-18 NOTE — Progress Notes (Signed)
PROGRESS NOTE  Sharon [name] YKD:[id number] DOB: 10/13/1932 DOA: 11/03/2017 PCP: Ann Held, DO  HPI/Recap of past 24 hours: HPI from Dr Ivor Costa on 11/03/2017 Sharon [name] is a 83 y.o. female with medical history significant of hypertension, hypothyroidism, iron deficiency anemia, newly diagnosed metastatic urothelial cancer, s/p of bilateral ureter stent placement due to obstruction, who presents with decreased urine output. Per patient's son, patient was recently hospitalized at [facility] on 11/18, found to have metastasized urothelial cancer (to liver and lumbar spine) after presenting with ARF. During that admission patient had bilateral ureter stent placement due to obstruction and requested further care be done here in [place]. As per son, for the past 24hrs prior to arrival, pt was found to have decreased urine output, she only had a few drops of urine. Patient denies symptoms of UTI. No nausea, vomiting, diarrhea or abdominal pain, chest pain, SOB. Pt reports worsening generalized weakness, bilateral leg edema. In the ED, pt was found to have uremia with potassium 6.0, bicarbonate 16, creatinine 8.39, BUN 95, sodium 119, WBC 13.7. Patient admitted to telemetry bed for further management. Nephrology and Urology were consulted.  Today, pt denied any new complains. Denies any chest pain, SOB, not in any resp distress, abdominal pain, N/V/D/C, fever/chills. Of note, pt is very hard of hearing/almost deaf   Assessment/Plan: Principal Problem:   Uremia Active Problems:   Hypothyroidism   Essential hypertension   Iron deficiency anemia   Hyponatremia   Hyperkalemia   Metabolic acidosis   Hypocalcemia   Urothelial cancer (H[facility])  #Acute renal failure/Uremic: Improving, s/p IR R nephrostomy tube on 10/17/17, improved urine output Likely due to Hydronephrosis from stent failure Pt s/p of bilateral stent placement in 11/18 at OSH   Renal US showed probable  caliectasis in the right kidney, but no gross hydronephrosis with no pelviectasis CT abd/pelvis: Mild bilateral hydronephrosis, right> left. Left kidney is atrophic. Large bilateral pleural effusions with bilateral lower lobe atelectasis or pneumonia. Numerous lesions throughout the liver most suggestive of metastatic disease. Morphologic features of the liver suggests cirrhosis. Extensive bony metastatic disease in the spine and pelvis compatible with sclerotic bone metastases Nephrology consulted, appreciate rec Urology on board Monitor closely, daily bmp  #Hyponatremia: Asymptomatic, no met encephalopathy, likely chronic as well Hold home Prinzide and Lasix Daily bmp  #Hyperkalemia: K=6.0. No T peaking on EKG on admission Resolved S/P Kayexalate 45 g 1, calcium gluconate, D50 and insulin Daily BMP  #Hypocalcemia: Calcium 6.2 on admission S/P 2 g of calcium Gluconate Start calcitriol and PhosLo Daily BMP  #Metabolic acidosis Due to uremia IV fluid as above  #Metastatic Urothelial cancer ([place]) Newly diagnosed on 11/18 S/p bilateral ureteral stent placement due to ARF 2/2 obstruction and currently R nephrostomy tube placement Spoke with oncall oncologist Dr Benay Spice, will plan on scheduling pt as an outpatient. Upon d/c, please schedule follow up with Dr Benay Spice Palliative consult placed PT/OT consulted  #HTN: Stable Continue home Metoprolol, hold Prinzide and Lasix due to uremia and hyponatremia -IV hydralazine prn  #Hypothyroidism: Last TSH was 1.49 on 09/25/12 -Continue home Synthroid  #Iron deficiency anemia: -Continue to iron supplement    Code Status: Full  Family Communication: Spoke with husband about plan of management  Disposition Plan: Home once stable   Consultants:  Urology  Nephrology  IR  Procedures:  IR Right Nephrostomy tube placement on 10/17/17  Antimicrobials:  None  DVT prophylaxis:  Heparin   Objective: Vitals:  10/17/17 2125 10/18/17 0116 10/18/17 0528 10/18/17 0901  BP: (!) 121/53  (!) 105/47 (!) 115/43  Pulse: 72  79   Resp: 19  18   Temp: 98.5 F (36.9 C)  99.2 F (37.3 C)   TempSrc: Oral  Oral   SpO2: 95%  92%   Weight:  68.9 kg (151 lb 14.4 oz)    Height:        Intake/Output Summary (Last 24 hours) at 10/18/2017 1336 Last data filed at 10/18/2017 1200 Gross per 24 hour  Intake 1110 ml  Output 2800 ml  Net -1690 ml   Filed Weights   10/16/17 0531 10/17/17 0501 10/18/17 0116  Weight: 69.5 kg (153 lb 3.5 oz) 70.5 kg (155 lb 6.8 oz) 68.9 kg (151 lb 14.4 oz)    Exam:   General:  Alert, awake, oriented, in no distress  Cardiovascular: S1-S2 present, no added sound  Respiratory: Bibasilar crackles noted  Abdomen: Soft, non tender, non distended, BS present  Musculoskeletal: 2+ pitting LE edema bilaterally  Skin: Normal  Psychiatry: Normal mood   Data Reviewed: CBC: Recent Labs  Lab 10/18/2017 1758 10/16/17 0445 10/17/17 0341 10/18/17 0301  WBC 13.7* 11.7* 9.7 9.0  NEUTROABS  --   --  8.3* 7.7  HGB 9.9* 9.0* 9.3* 9.0*  HCT 27.7* 26.3* 26.6* 25.9*  MCV 76.7* 77.6* 75.8* 76.9*  PLT 253 265 244 448   Basic Metabolic Panel: Recent Labs  Lab 11/01/2017 1758 10/16/17 0005 10/16/17 0445 10/16/17 1003 10/17/17 0341 10/18/17 0301  NA 119* 118* 119*  --  123* 123*  K 6.0* 5.7* 5.4*  --  5.2* 4.6  CL 84* 84* 86*  --  88* 89*  CO2 16* 15* 16*  --  17* 14*  GLUCOSE 125* 94 94  --  62* 85  BUN 95* 96* 94*  --  105* 100*  CREATININE 8.39* 8.67* 8.75*  --  9.15* 8.63*  CALCIUM 6.2* 6.0* 6.3*  --  6.4* 6.6*  MG  --   --   --  1.9  --   --   PHOS  --   --   --  5.6*  --   --    GFR: Estimated Creatinine Clearance: 4.6 mL/min (A) (by C-G formula based on SCr of 8.63 mg/dL (H)). Liver Function Tests: No results for input(s): AST, ALT, ALKPHOS, BILITOT, PROT, ALBUMIN in the last 168 hours. No results for input(s): LIPASE, AMYLASE in the last 168 hours. No results for  input(s): AMMONIA in the last 168 hours. Coagulation Profile: Recent Labs  Lab 10/17/17 1409  INR 1.28   Cardiac Enzymes: No results for input(s): CKTOTAL, CKMB, CKMBINDEX, TROPONINI in the last 168 hours. BNP (last 3 results) No results for input(s): PROBNP in the last 8760 hours. HbA1C: No results for input(s): HGBA1C in the last 72 hours. CBG: Recent Labs  Lab 10/16/17 0750 10/17/17 0811 10/17/17 0858 10/17/17 1209  GLUCAP 83 57* 130* 79   Lipid Profile: No results for input(s): CHOL, HDL, LDLCALC, TRIG, CHOLHDL, LDLDIRECT in the last 72 hours. Thyroid Function Tests: No results for input(s): TSH, T4TOTAL, FREET4, T3FREE, THYROIDAB in the last 72 hours. Anemia Panel: No results for input(s): VITAMINB12, FOLATE, FERRITIN, TIBC, IRON, RETICCTPCT in the last 72 hours. Urine analysis:    Component Value Date/Time   COLORURINE RED (A) 10/16/2017 1053   APPEARANCEUR TURBID (A) 10/16/2017 1053   LABSPEC  10/16/2017 1053    TEST NOT REPORTED DUE TO COLOR INTERFERENCE  OF URINE PIGMENT   PHURINE  10/16/2017 1053    TEST NOT REPORTED DUE TO COLOR INTERFERENCE OF URINE PIGMENT   GLUCOSEU (A) 10/16/2017 1053    TEST NOT REPORTED DUE TO COLOR INTERFERENCE OF URINE PIGMENT   HGBUR (A) 10/16/2017 1053    TEST NOT REPORTED DUE TO COLOR INTERFERENCE OF URINE PIGMENT   HGBUR negative 08/24/2010 0824   BILIRUBINUR (A) 10/16/2017 1053    TEST NOT REPORTED DUE TO COLOR INTERFERENCE OF URINE PIGMENT   BILIRUBINUR Neg 09/25/2012 1453   KETONESUR (A) 10/16/2017 1053    TEST NOT REPORTED DUE TO COLOR INTERFERENCE OF URINE PIGMENT   PROTEINUR (A) 10/16/2017 1053    TEST NOT REPORTED DUE TO COLOR INTERFERENCE OF URINE PIGMENT   UROBILINOGEN 0.2 09/25/2012 1453   UROBILINOGEN 0.2 08/24/2010 0824   NITRITE (A) 10/16/2017 1053    TEST NOT REPORTED DUE TO COLOR INTERFERENCE OF URINE PIGMENT   LEUKOCYTESUR (A) 10/16/2017 1053    TEST NOT REPORTED DUE TO COLOR INTERFERENCE OF URINE PIGMENT    Sepsis Labs: _0 (procalcitonin:4,lacticidven:4)  ) Recent Results (from the past 240 hour(s))  Culture, Urine     Status: Abnormal   Collection Time: 10/16/17 11:50 PM  Result Value Ref Range Status   Specimen Description URINE, CATHETERIZED  Final   Special Requests NONE  Final   Culture <10,000 COLONIES/mL INSIGNIFICANT GROWTH (A)  Final   Report Status 10/18/2017 FINAL  Final      Studies: Ir Nephrostomy Placement Right  Result Date: 10/17/2017 INDICATION: 83 year old female with a history of urothelial cancer. She has bilateral ureteral stents which were placed at an outside institution. Unfortunately, she has recurrent right-sided hydronephrosis and worsening renal function consistent with obstructed uropathy. Placement of a percutaneous nephrostomy tube is warranted for renal decompression. EXAM: IR NEPHROSTOMY PLACEMENT RIGHT COMPARISON:  None. MEDICATIONS: Ciprofloxacin 400 mg IV; The antibiotic was administered in an appropriate time frame prior to skin puncture. ANESTHESIA/SEDATION: Fentanyl 75 mcg IV; Versed 1.5 mg IV Moderate Sedation Time:  15 minutes The patient was continuously monitored during the procedure by the interventional radiology nurse under my direct supervision. CONTRAST:  15 mL Isovue-300 - administered into the collecting system(s) FLUOROSCOPY TIME:  Fluoroscopy Time: 1 minutes 54 seconds (5 mGy). COMPLICATIONS: None immediate. PROCEDURE: Informed written consent was obtained from the patient after a thorough discussion of the procedural risks, benefits and alternatives. All questions were addressed. Maximal Sterile Barrier Technique was utilized including caps, mask, sterile gowns, sterile gloves, sterile drape, hand hygiene and skin antiseptic. A timeout was performed prior to the initiation of the procedure. The right flank was interrogated with ultrasound. The right kidney is identified and is found to be hydronephrotic. A suitable calyx was selected.  Local anesthesia was attained by infiltration with 1% lidocaine. A small dermatotomy was made. Under real-time sonographic guidance, a posterior interpolar calyx was punctured with a 21 gauge Accustick needle. There was return of clear urine. A hand injection of contrast material through the needle opacifies the renal collecting system. A 0.018 inch wire was advanced into the renal collecting system. The needle was removed. The Accustick sheath was advanced of the renal collecting system. The 0.018 inch wire was then exchanged for a 0.035 inch wire. The skin tract was dilated to 10 [place]. A Cook 10.2 [place] all-purpose drainage catheter was advanced over the wire and formed in the renal pelvis. A gentle hand injection of contrast material confirms placement within the renal pelvis. A fluoroscopic spot  image was saved. The catheter was connected to gravity bag drainage and secured to the skin with 0 Prolene suture. The patient tolerated the procedure well. IMPRESSION: Successful placement of a 10 French right-sided percutaneous nephrostomy tube. Patient should return Interventional Radiology in 6- 8 weeks for nephrostomy tube check and exchange. Signed, Criselda Peaches, MD [facility] Electronically Signed   By: Jacqulynn Cadet M.D.   On: 10/17/2017 16:52    Scheduled Meds: . calcitRIOL  0.25 mcg Oral Daily  . calcium acetate  1,334 mg Oral TID WC  . calcium-vitamin D  1 tablet Oral BID  . cholecalciferol  1,000 Units Oral Daily  . ferrous gluconate  325 mg Oral Q breakfast  . heparin  5,000 Units Subcutaneous Q8H  . hydrocortisone   Rectal BID  . levothyroxine  25 mcg Oral QAC breakfast  . metoprolol tartrate  12.5 mg Oral BID  . multivitamin with minerals  1 tablet Oral Daily  . vitamin B-12  1,000 mcg Oral Daily    Continuous Infusions:    LOS: 3 days     Alma Friendly, MD [facility]  If 7PM-7AM, please  contact night-coverage [email or web address] Password [email or web address] 10/18/2017, 1:36 PM

## 2017-10-18 NOTE — Progress Notes (Signed)
Assessment and Plan: AKI with bilateral hydro with bilateral stents that are obstructed.  Right NT placed yesterday with brisk UOP.   Her Cr down to 8.63 from 9.15.  When her medical condition has improved, we could consider replacing her current stents with larger caliber ones.   Subjective: Sharon [name] had a right NT placed yesterday and has had brisk urine output since.   Her Cr is starting to fall.  ROS:  Review of Systems  Unable to perform ROS: Other  Pt is profoundly deaf.  Anti-infectives: Anti-infectives (From admission, onward)   Start     Dose/Rate Route Frequency Ordered Stop   10/17/17 1600  ciprofloxacin (CIPRO) IVPB 400 mg     400 mg 200 mL/hr over 60 Minutes Intravenous To Radiology 10/17/17 1507 10/17/17 1840   10/17/17 1525  ciprofloxacin (CIPRO) 400 MG/200ML IVPB    Comments:  [name]   : cabinet override      10/17/17 1525 10/18/17 0344      Current Facility-Administered Medications  Medication Dose Route Frequency Provider Last Rate Last Dose  . acetaminophen (TYLENOL) tablet 650 mg  650 mg Oral Q6H PRN Ivor Costa, MD      . calcitRIOL (ROCALTROL) capsule 0.25 mcg  0.25 mcg Oral Daily Ivor Costa, MD   0.25 mcg at 10/16/17 1027  . calcium acetate (PHOSLO) capsule 1,334 mg  1,334 mg Oral TID WC Ivor Costa, MD   1,334 mg at 10/16/17 1732  . calcium-vitamin D (OSCAL WITH D) 500-200 MG-UNIT per tablet 1 tablet  1 tablet Oral BID Ivor Costa, MD   1 tablet at 10/16/17 2109  . cholecalciferol (VITAMIN D) tablet 1,000 Units  1,000 Units Oral Daily Ivor Costa, MD   1,000 Units at 10/16/17 1027  . cyclobenzaprine (FLEXERIL) tablet 5 mg  5 mg Oral TID PRN Ivor Costa, MD      . dextrose 5 %-0.9 % sodium chloride infusion   Intravenous Continuous Alma Friendly, MD 50 mL/hr at 10/18/17 0520    . ferrous gluconate (FERGON) tablet 325 mg  325 mg Oral Q breakfast Ivor Costa, MD      . heparin injection 5,000 Units  5,000 Units Subcutaneous Q8H Monia Sabal, PA-C    5,000 Units at 10/18/17 5027  . hydrocortisone (ANUSOL-HC) 2.5 % rectal cream   Rectal BID Ivor Costa, MD      . levothyroxine (SYNTHROID, LEVOTHROID) tablet 25 mcg  25 mcg Oral QAC breakfast Ivor Costa, MD   25 mcg at 10/17/17 [phone]  . metoprolol tartrate (LOPRESSOR) tablet 12.5 mg  12.5 mg Oral BID Roney Jaffe, MD   12.5 mg at 10/17/17 2213  . multivitamin with minerals tablet 1 tablet  1 tablet Oral Daily Ivor Costa, MD   1 tablet at 10/16/17 1027  . ondansetron (ZOFRAN) injection 4 mg  4 mg Intravenous Q8H PRN Ivor Costa, MD      . polyethylene glycol (MIRALAX / GLYCOLAX) packet 17 g  17 g Oral Daily PRN Ivor Costa, MD      . vitamin B-12 (CYANOCOBALAMIN) tablet 1,000 mcg  1,000 mcg Oral Daily Ivor Costa, MD   1,000 mcg at 10/16/17 1027  . zolpidem (AMBIEN) tablet 5 mg  5 mg Oral QHS PRN Ivor Costa, MD         Objective: Vital signs in last 24 hours: Temp:  [98.5 F (36.9 C)-99.2 F (37.3 C)] 99.2 F (37.3 C) (12/04 0528) Pulse Rate:  [61-79] 79 (12/04 0528) Resp:  [  15-19] 18 (12/04 0528) BP: (105-141)/(40-65) 105/47 (12/04 0528) SpO2:  [92 %-98 %] 92 % (12/04 0528) Weight:  [68.9 kg (151 lb 14.4 oz)] 68.9 kg (151 lb 14.4 oz) (12/04 0116)  Intake/Output from previous day: 12/03 0701 - 12/04 0700 In: 0  Out: 1750 [Urine:1750] Intake/Output this shift: Total I/O In: -  Out: 1750 [Urine:1750]   Physical Exam  Constitutional: She is well-developed, well-nourished, and in no distress.  Vitals reviewed.   Lab Results:  Recent Labs    10/17/17 0341 10/18/17 0301  WBC 9.7 9.0  HGB 9.3* 9.0*  HCT 26.6* 25.9*  PLT 244 212   BMET Recent Labs    10/17/17 0341 10/18/17 0301  NA 123* 123*  K 5.2* 4.6  CL 88* 89*  CO2 17* 14*  GLUCOSE 62* 85  BUN 105* 100*  CREATININE 9.15* 8.63*  CALCIUM 6.4* 6.6*   PT/INR Recent Labs    10/17/17 1409  LABPROT 15.9*  INR 1.28   ABG Recent Labs    11/05/2017 1945  HCO3 16.3*    Studies/Results: Ct Abdomen Pelvis Wo  Contrast  Result Date: 10/16/2017 CLINICAL DATA:  Hydronephrosis. History of bilateral renal stents through hydronephrosis. Worsening acute renal failure. EXAM: CT ABDOMEN AND PELVIS WITHOUT CONTRAST TECHNIQUE: Multidetector CT imaging of the abdomen and pelvis was performed following the standard protocol without IV contrast. COMPARISON:  Ultrasound 10/28/2017 FINDINGS: Lower chest: Large bilateral pleural effusions with compressive atelectasis or pneumonia in the lower lobes. Heart is normal size. Hepatobiliary: Numerous ill-defined masses throughout the liver. There are morphologic changes of cirrhosis with nodular contours. Small gallstones layering in the gallbladder. Pancreas: No focal abnormality or ductal dilatation. Spleen: No focal abnormality.  Normal size. Adrenals/Urinary Tract: Bilateral ureteral stents are in place in the within the bladder. Mild bilateral hydronephrosis, right greater than left. Left kidney is atrophic. Stomach/Bowel: Sigmoid diverticulosis. No active diverticulitis. No evidence of bowel obstruction. Vascular/Lymphatic: Diffuse aortic and iliac calcifications. No aneurysm or adenopathy. Reproductive: Prior hysterectomy.  No adnexal masses. Other: Small amount of free fluid in the pelvis and around the liver and spleen. No free air. Extensive edema throughout the subcutaneous soft tissues. Musculoskeletal: Extensive sclerotic lesions throughout the thoracic and lumbar spine and sacrum compatible with sclerotic metastases. IMPRESSION: Numerous lesions throughout the liver most suggestive of metastatic disease. Morphologic features of the liver suggests cirrhosis. Extensive bony metastatic disease in the spine and pelvis compatible with sclerotic bone metastases. Mild bilateral hydronephrosis, right greater than left. Left kidney is atrophic. Large bilateral pleural effusions with bilateral lower lobe atelectasis or pneumonia. Cholelithiasis. Sigmoid diverticulosis. Electronically  Signed   By: Rolm Baptise M.D.   On: 10/16/2017 15:12   US Renal  Result Date: 10/17/2017 CLINICAL DATA:  Bilateral hydronephrosis EXAM: RENAL / URINARY TRACT ULTRASOUND COMPLETE COMPARISON:  Abdominal CT from yesterday FINDINGS: Right Kidney: Length: 13 cm. Echogenic with moderate hydronephrosis. The known ureteral stent is difficult to visualize. No mass lesion. Left Kidney: Length: 9 cm, which is overestimated relative to abdominal CT from yesterday (8cm). Echogenic cortex with diffuse cortical thinning. Mild hydronephrosis. Known ureteral stent is difficult to visualize. Bladder: Decompressed. Trace ascites.  Known pleural effusions. IMPRESSION: 1. Moderate right and mild left hydronephrosis that is stable from CT yesterday. 2. Decompressed urinary bladder. 3. Chronic medical renal disease with asymmetric left renal atrophy. 4. Trace ascites. Electronically Signed   By: Monte Fantasia M.D.   On: 10/17/2017 08:35   Dg Chest Port 1 View  Result Date:  10/16/2017 CLINICAL DATA:  Acute kidney injury. History of metastatic cancer and ureteral stents. EXAM: PORTABLE CHEST 1 VIEW COMPARISON:  Chest radiograph Mar 17, 2015 FINDINGS: Cardiac silhouette is mildly enlarged. Mediastinal silhouette is nonsuspicious. Moderate RIGHT and small LEFT pleural effusions with underlying consolidation. No pneumothorax. Soft tissue planes included osseous structures are nonsuspicious. IMPRESSION: Moderate RIGHT and small LEFT pleural effusions with underlying consolidation. Recommend follow-up chest radiograph after treatment to exclude underlying mass. Mild cardiomegaly. Electronically Signed   By: Elon Alas M.D.   On: 10/16/2017 20:02   Ir Nephrostomy Placement Right  Result Date: 10/17/2017 INDICATION: 83 year old female with a history of urothelial cancer. She has bilateral ureteral stents which were placed at an outside institution. Unfortunately, she has recurrent right-sided hydronephrosis and worsening renal  function consistent with obstructed uropathy. Placement of a percutaneous nephrostomy tube is warranted for renal decompression. EXAM: IR NEPHROSTOMY PLACEMENT RIGHT COMPARISON:  None. MEDICATIONS: Ciprofloxacin 400 mg IV; The antibiotic was administered in an appropriate time frame prior to skin puncture. ANESTHESIA/SEDATION: Fentanyl 75 mcg IV; Versed 1.5 mg IV Moderate Sedation Time:  15 minutes The patient was continuously monitored during the procedure by the interventional radiology nurse under my direct supervision. CONTRAST:  15 mL Isovue-300 - administered into the collecting system(s) FLUOROSCOPY TIME:  Fluoroscopy Time: 1 minutes 54 seconds (5 mGy). COMPLICATIONS: None immediate. PROCEDURE: Informed written consent was obtained from the patient after a thorough discussion of the procedural risks, benefits and alternatives. All questions were addressed. Maximal Sterile Barrier Technique was utilized including caps, mask, sterile gowns, sterile gloves, sterile drape, hand hygiene and skin antiseptic. A timeout was performed prior to the initiation of the procedure. The right flank was interrogated with ultrasound. The right kidney is identified and is found to be hydronephrotic. A suitable calyx was selected. Local anesthesia was attained by infiltration with 1% lidocaine. A small dermatotomy was made. Under real-time sonographic guidance, a posterior interpolar calyx was punctured with a 21 gauge Accustick needle. There was return of clear urine. A hand injection of contrast material through the needle opacifies the renal collecting system. A 0.018 inch wire was advanced into the renal collecting system. The needle was removed. The Accustick sheath was advanced of the renal collecting system. The 0.018 inch wire was then exchanged for a 0.035 inch wire. The skin tract was dilated to 10 [place]. A Cook 10.2 [place] all-purpose drainage catheter was advanced over the wire and formed in the renal pelvis. A  gentle hand injection of contrast material confirms placement within the renal pelvis. A fluoroscopic spot image was saved. The catheter was connected to gravity bag drainage and secured to the skin with 0 Prolene suture. The patient tolerated the procedure well. IMPRESSION: Successful placement of a 10 French right-sided percutaneous nephrostomy tube. Patient should return Interventional Radiology in 6- 8 weeks for nephrostomy tube check and exchange. Signed, Criselda Peaches, MD [facility] Rad[facility] Electronically Signed   By: Jacqulynn Cadet M.D.   On: 10/17/2017 16:52           LOS: 3 days    Irine Seal 10/18/2017 [phone] ID: Wray Kearns, female   DOB: [birth year], 83 y.o.   MRN: [id number]

## 2017-10-18 NOTE — Progress Notes (Signed)
Referring Physician(s): Dr Jeffie Pollock  Supervising Physician: Corrie Mckusick  Patient Status:  [facility] - In-pt  Chief Complaint:  Rt Hydronephrosis  Subjective:  R Percutaneous nephrostomy placement in IR 12/3 OP is great Minimally blood tinged Cr trending down  Allergies: Patient has no known allergies.  Medications: Prior to Admission medications   Medication Sig Start Date End Date Taking? Authorizing Provider  Calcium Carbonate-Vitamin D (CALCIUM + D) 600-200 MG-UNIT TABS Take 2 tablets by mouth daily.     Yes [provider]  cholecalciferol (VITAMIN D) 1000 UNITS tablet Take 1,000 Units by mouth daily.     Yes [provider]  cyclobenzaprine (FLEXERIL) 5 MG tablet Take 5 mg by mouth 3 (three) times daily as needed for muscle spasms.   Yes [provider]  ferrous gluconate (FERGON) 325 MG tablet Take 325 mg by mouth daily with breakfast.     Yes [provider]  Flaxseed, Linseed, (FLAX SEED OIL) 1000 MG CAPS Take 1 capsule by mouth daily.     Yes [provider]  furosemide (LASIX) 40 MG tablet Take 40 mg by mouth daily.   Yes [provider]  levothyroxine (SYNTHROID, LEVOTHROID) 25 MCG tablet 1 po qd 09/25/12  Yes Roma Schanz R, DO  metoprolol tartrate (LOPRESSOR) 25 MG tablet Take 25 mg by mouth 2 (two) times daily.   Yes [provider]  Multiple Vitamin (MULTIVITAMIN) tablet Take 1 tablet by mouth daily.     Yes [provider]  vitamin B-12 (CYANOCOBALAMIN) 1000 MCG tablet Take 1,000 mcg by mouth daily.     Yes [provider]  zoster vaccine live, PF, (ZOSTAVAX) 67209 UNT/0.65ML injection Inject 19,400 Units into the skin once. 09/25/12  Yes Roma Schanz R, DO  alendronate (FOSAMAX) 70 MG tablet Take with a full glass of water on an empty stomach. Patient not taking: Reported on 10/29/2017 09/25/12   Carollee Herter, Alferd Apa, DO  ciprofloxacin (CIPRO) 250 MG tablet Take 1 tablet  (250 mg total) by mouth 2 (two) times daily. Patient not taking: Reported on 11/13/2017 09/27/12   Carollee Herter, Alferd Apa, DO  lisinopril-hydrochlorothiazide (PRINZIDE,ZESTORETIC) 10-12.5 MG per tablet 1 tab by mouth daily--Office visit due now Patient not taking: Reported on 10/26/2017 09/21/13   Carollee Herter, Alferd Apa, DO     Vital Signs: BP 114/72 (BP Location: Right Arm)   Pulse 82   Temp 98.3 F (36.8 C) (Oral)   Resp 20   Ht 5\' 3"  (1.6 m)   Wt 151 lb 14.4 oz (68.9 kg)   SpO2 94%   BMI 26.91 kg/m   Physical Exam  Neurological: She is alert.  Pleasantly confused  Skin: Skin is warm.  Site of R PCN is clean and dry NT No bleeding OP sl blood tinged OP is great! 1.7 L yesterday; 1 L today  Nursing note and vitals reviewed.   Imaging: Ct Abdomen Pelvis Wo Contrast  Result Date: 10/16/2017 CLINICAL DATA:  Hydronephrosis. History of bilateral renal stents through hydronephrosis. Worsening acute renal failure. EXAM: CT ABDOMEN AND PELVIS WITHOUT CONTRAST TECHNIQUE: Multidetector CT imaging of the abdomen and pelvis was performed following the standard protocol without IV contrast. COMPARISON:  Ultrasound 11/11/2017 FINDINGS: Lower chest: Large bilateral pleural effusions with compressive atelectasis or pneumonia in the lower lobes. Heart is normal size. Hepatobiliary: Numerous ill-defined masses throughout the liver. There are morphologic changes of cirrhosis with nodular contours. Small gallstones layering in the gallbladder. Pancreas: No  focal abnormality or ductal dilatation. Spleen: No focal abnormality.  Normal size. Adrenals/Urinary Tract: Bilateral ureteral stents are in place in the within the bladder. Mild bilateral hydronephrosis, right greater than left. Left kidney is atrophic. Stomach/Bowel: Sigmoid diverticulosis. No active diverticulitis. No evidence of bowel obstruction. Vascular/Lymphatic: Diffuse aortic and iliac calcifications. No aneurysm or adenopathy. Reproductive:  Prior hysterectomy.  No adnexal masses. Other: Small amount of free fluid in the pelvis and around the liver and spleen. No free air. Extensive edema throughout the subcutaneous soft tissues. Musculoskeletal: Extensive sclerotic lesions throughout the thoracic and lumbar spine and sacrum compatible with sclerotic metastases. IMPRESSION: Numerous lesions throughout the liver most suggestive of metastatic disease. Morphologic features of the liver suggests cirrhosis. Extensive bony metastatic disease in the spine and pelvis compatible with sclerotic bone metastases. Mild bilateral hydronephrosis, right greater than left. Left kidney is atrophic. Large bilateral pleural effusions with bilateral lower lobe atelectasis or pneumonia. Cholelithiasis. Sigmoid diverticulosis. Electronically Signed   By: Rolm Baptise M.D.   On: 10/16/2017 15:12   US Renal  Result Date: 10/17/2017 CLINICAL DATA:  Bilateral hydronephrosis EXAM: RENAL / URINARY TRACT ULTRASOUND COMPLETE COMPARISON:  Abdominal CT from yesterday FINDINGS: Right Kidney: Length: 13 cm. Echogenic with moderate hydronephrosis. The known ureteral stent is difficult to visualize. No mass lesion. Left Kidney: Length: 9 cm, which is overestimated relative to abdominal CT from yesterday (8cm). Echogenic cortex with diffuse cortical thinning. Mild hydronephrosis. Known ureteral stent is difficult to visualize. Bladder: Decompressed. Trace ascites.  Known pleural effusions. IMPRESSION: 1. Moderate right and mild left hydronephrosis that is stable from CT yesterday. 2. Decompressed urinary bladder. 3. Chronic medical renal disease with asymmetric left renal atrophy. 4. Trace ascites. Electronically Signed   By: Monte Fantasia M.D.   On: 10/17/2017 08:35   US Renal  Result Date: 11/13/2017 CLINICAL DATA:  Renal failure. EXAM: RENAL / URINARY TRACT ULTRASOUND COMPLETE COMPARISON:  None. FINDINGS: Right Kidney: Length: 13.2 cm. Rounded regions of fluid in the left  kidney are favored to represent caliectasis. Renal cysts considered less likely. No pelviectasis. No solid mass noted. Left Kidney: Length: 11.5 cm. The left kidney is not well visualized or characterized. Bladder: Decompressed. IMPRESSION: 1. Probable caliectasis in the right kidney. Renal cysts are considered less likely. No gross hydronephrosis with no pelviectasis. 2. The left kidney is not well visualized. 3. The bladder is poorly evaluated due to lack of distention. 4. A small amount of free fluid is seen in the right upper quadrant and pelvis, nonspecific. Electronically Signed   By: Dorise Bullion III M.D   On: 11/11/2017 21:06   Dg Chest Port 1 View  Result Date: 10/16/2017 CLINICAL DATA:  Acute kidney injury. History of metastatic cancer and ureteral stents. EXAM: PORTABLE CHEST 1 VIEW COMPARISON:  Chest radiograph Mar 17, 2015 FINDINGS: Cardiac silhouette is mildly enlarged. Mediastinal silhouette is nonsuspicious. Moderate RIGHT and small LEFT pleural effusions with underlying consolidation. No pneumothorax. Soft tissue planes included osseous structures are nonsuspicious. IMPRESSION: Moderate RIGHT and small LEFT pleural effusions with underlying consolidation. Recommend follow-up chest radiograph after treatment to exclude underlying mass. Mild cardiomegaly. Electronically Signed   By: Elon Alas M.D.   On: 10/16/2017 20:02   Ir Nephrostomy Placement Right  Result Date: 10/17/2017 INDICATION: 83 year old female with a history of urothelial cancer. She has bilateral ureteral stents which were placed at an outside institution. Unfortunately, she has recurrent right-sided hydronephrosis and worsening renal function consistent with obstructed uropathy. Placement of a percutaneous  nephrostomy tube is warranted for renal decompression. EXAM: IR NEPHROSTOMY PLACEMENT RIGHT COMPARISON:  None. MEDICATIONS: Ciprofloxacin 400 mg IV; The antibiotic was administered in an appropriate time frame prior  to skin puncture. ANESTHESIA/SEDATION: Fentanyl 75 mcg IV; Versed 1.5 mg IV Moderate Sedation Time:  15 minutes The patient was continuously monitored during the procedure by the interventional radiology nurse under my direct supervision. CONTRAST:  15 mL Isovue-300 - administered into the collecting system(s) FLUOROSCOPY TIME:  Fluoroscopy Time: 1 minutes 54 seconds (5 mGy). COMPLICATIONS: None immediate. PROCEDURE: Informed written consent was obtained from the patient after a thorough discussion of the procedural risks, benefits and alternatives. All questions were addressed. Maximal Sterile Barrier Technique was utilized including caps, mask, sterile gowns, sterile gloves, sterile drape, hand hygiene and skin antiseptic. A timeout was performed prior to the initiation of the procedure. The right flank was interrogated with ultrasound. The right kidney is identified and is found to be hydronephrotic. A suitable calyx was selected. Local anesthesia was attained by infiltration with 1% lidocaine. A small dermatotomy was made. Under real-time sonographic guidance, a posterior interpolar calyx was punctured with a 21 gauge Accustick needle. There was return of clear urine. A hand injection of contrast material through the needle opacifies the renal collecting system. A 0.018 inch wire was advanced into the renal collecting system. The needle was removed. The Accustick sheath was advanced of the renal collecting system. The 0.018 inch wire was then exchanged for a 0.035 inch wire. The skin tract was dilated to 10 [place]. A Cook 10.2 [place] all-purpose drainage catheter was advanced over the wire and formed in the renal pelvis. A gentle hand injection of contrast material confirms placement within the renal pelvis. A fluoroscopic spot image was saved. The catheter was connected to gravity bag drainage and secured to the skin with 0 Prolene suture. The patient tolerated the procedure well. IMPRESSION: Successful  placement of a 10 French right-sided percutaneous nephrostomy tube. Patient should return Interventional Radiology in 6- 8 weeks for nephrostomy tube check and exchange. Signed, Criselda Peaches, MD [facility] Radiology Electronically Signed   By: Jacqulynn Cadet M.D.   On: 10/17/2017 16:52    Labs:  CBC: Recent Labs    11/08/2017 1758 10/16/17 0445 10/17/17 0341 10/18/17 0301  WBC 13.7* 11.7* 9.7 9.0  HGB 9.9* 9.0* 9.3* 9.0*  HCT 27.7* 26.3* 26.6* 25.9*  PLT 253 265 244 212    COAGS: Recent Labs    10/17/17 1409  INR 1.28  APTT 43*    BMP: Recent Labs    10/16/17 0005 10/16/17 0445 10/17/17 0341 10/18/17 0301  NA 118* 119* 123* 123*  K 5.7* 5.4* 5.2* 4.6  CL 84* 86* 88* 89*  CO2 15* 16* 17* 14*  GLUCOSE 94 94 62* 85  BUN 96* 94* 105* 100*  CALCIUM 6.0* 6.3* 6.4* 6.6*  CREATININE 8.67* 8.75* 9.15* 8.63*  GFRNONAA 4* 4* 3* 4*  GFRAA 4* 4* 4* 4*    LIVER FUNCTION TESTS: No results for input(s): BILITOT, AST, ALT, ALKPHOS, PROT, ALBUMIN in the last 8760 hours.  Assessment and Plan:  R PCN in place Cr trending down OP great  Electronically Signed: Khadijah Mastrianni A, PA-C 10/18/2017, 2:44 PM   I spent a total of 15 Minutes at the the patient's bedside AND on the patient's hospital floor or unit, greater than 50% of which was counseling/coordinating care for R PCN

## 2017-10-18 NOTE — Progress Notes (Signed)
Subjective:  S/p nephrostomy tube with brisk UOP- 1750 yest- 600 so far today - crt down 9/15 to 8.6.  A little more alert today Objective Vital signs in last 24 hours: Vitals:   10/17/17 2125 10/18/17 0116 10/18/17 0528 10/18/17 0901  BP: (!) 121/53  (!) 105/47 (!) 115/43  Pulse: 72  79   Resp: 19  18   Temp: 98.5 F (36.9 C)  99.2 F (37.3 C)   TempSrc: Oral  Oral   SpO2: 95%  92%   Weight:  68.9 kg (151 lb 14.4 oz)    Height:       Weight change: -1.6 kg (-8.4 oz)  Intake/Output Summary (Last 24 hours) at 10/18/2017 1101 Last data filed at 10/18/2017 0910 Gross per 24 hour  Intake 1110 ml  Output 2350 ml  Net -1240 ml    Assessment/ Plan: Pt is a 83 y.o. yo female who was admitted on 10/18/2017 with weakness- recent dx of metastatic urothelial CA- s/p bilat ureteral stents but now with cont hydro and AKI  Assessment/Plan: 1. Acute renal failure - recurrent issue w/ recent bilat ureteral stent placement in [place], [place] 3 wks ago.  Repeat  CT showed an atrophic L kidney , may not be functional. Right kidney appeared obstructed- now s/p nephrost tube with brisk UOP and crt down. No absolute indication for acute dialysis and would hesitate to use in this setting as do not know prognosis- I hope to see cont improvement in renal function .   2. HTN/volume- if anything overloaded- BP is fine - low dose lopressor- stop IVF 3. Anemia- stable in the 9's 4. Secondary hyperparathyroidism- is on phoslo and calcitriol as well as some IV for low calcium - corrected is probably OK 5. Hyponatremia- likely due to volume overload- is improved from yest  - stop fluids   Ava Deguire A    Labs: Basic Metabolic Panel: Recent Labs  Lab 10/16/17 0445 10/16/17 1003 10/17/17 0341 10/18/17 0301  NA 119*  --  123* 123*  K 5.4*  --  5.2* 4.6  CL 86*  --  88* 89*  CO2 16*  --  17* 14*  GLUCOSE 94  --  62* 85  BUN 94*  --  105* 100*  CREATININE 8.75*  --  9.15* 8.63*  CALCIUM 6.3*  --   6.4* 6.6*  PHOS  --  5.6*  --   --    Liver Function Tests: No results for input(s): AST, ALT, ALKPHOS, BILITOT, PROT, ALBUMIN in the last 168 hours. No results for input(s): LIPASE, AMYLASE in the last 168 hours. No results for input(s): AMMONIA in the last 168 hours. CBC: Recent Labs  Lab 11/06/2017 1758 10/16/17 0445 10/17/17 0341 10/18/17 0301  WBC 13.7* 11.7* 9.7 9.0  NEUTROABS  --   --  8.3* 7.7  HGB 9.9* 9.0* 9.3* 9.0*  HCT 27.7* 26.3* 26.6* 25.9*  MCV 76.7* 77.6* 75.8* 76.9*  PLT 253 265 244 212   Cardiac Enzymes: No results for input(s): CKTOTAL, CKMB, CKMBINDEX, TROPONINI in the last 168 hours. CBG: Recent Labs  Lab 10/16/17 0750 10/17/17 0811 10/17/17 0858 10/17/17 1209  GLUCAP 83 57* 130* 79    Iron Studies: No results for input(s): IRON, TIBC, TRANSFERRIN, FERRITIN in the last 72 hours. Studies/Results: Ct Abdomen Pelvis Wo Contrast  Result Date: 10/16/2017 CLINICAL DATA:  Hydronephrosis. History of bilateral renal stents through hydronephrosis. Worsening acute renal failure. EXAM: CT ABDOMEN AND PELVIS WITHOUT CONTRAST TECHNIQUE: Multidetector CT  imaging of the abdomen and pelvis was performed following the standard protocol without IV contrast. COMPARISON:  Ultrasound 10/24/2017 FINDINGS: Lower chest: Large bilateral pleural effusions with compressive atelectasis or pneumonia in the lower lobes. Heart is normal size. Hepatobiliary: Numerous ill-defined masses throughout the liver. There are morphologic changes of cirrhosis with nodular contours. Small gallstones layering in the gallbladder. Pancreas: No focal abnormality or ductal dilatation. Spleen: No focal abnormality.  Normal size. Adrenals/Urinary Tract: Bilateral ureteral stents are in place in the within the bladder. Mild bilateral hydronephrosis, right greater than left. Left kidney is atrophic. Stomach/Bowel: Sigmoid diverticulosis. No active diverticulitis. No evidence of bowel obstruction.  Vascular/Lymphatic: Diffuse aortic and iliac calcifications. No aneurysm or adenopathy. Reproductive: Prior hysterectomy.  No adnexal masses. Other: Small amount of free fluid in the pelvis and around the liver and spleen. No free air. Extensive edema throughout the subcutaneous soft tissues. Musculoskeletal: Extensive sclerotic lesions throughout the thoracic and lumbar spine and sacrum compatible with sclerotic metastases. IMPRESSION: Numerous lesions throughout the liver most suggestive of metastatic disease. Morphologic features of the liver suggests cirrhosis. Extensive bony metastatic disease in the spine and pelvis compatible with sclerotic bone metastases. Mild bilateral hydronephrosis, right greater than left. Left kidney is atrophic. Large bilateral pleural effusions with bilateral lower lobe atelectasis or pneumonia. Cholelithiasis. Sigmoid diverticulosis. Electronically Signed   By: Rolm Baptise M.D.   On: 10/16/2017 15:12   US Renal  Result Date: 10/17/2017 CLINICAL DATA:  Bilateral hydronephrosis EXAM: RENAL / URINARY TRACT ULTRASOUND COMPLETE COMPARISON:  Abdominal CT from yesterday FINDINGS: Right Kidney: Length: 13 cm. Echogenic with moderate hydronephrosis. The known ureteral stent is difficult to visualize. No mass lesion. Left Kidney: Length: 9 cm, which is overestimated relative to abdominal CT from yesterday (8cm). Echogenic cortex with diffuse cortical thinning. Mild hydronephrosis. Known ureteral stent is difficult to visualize. Bladder: Decompressed. Trace ascites.  Known pleural effusions. IMPRESSION: 1. Moderate right and mild left hydronephrosis that is stable from CT yesterday. 2. Decompressed urinary bladder. 3. Chronic medical renal disease with asymmetric left renal atrophy. 4. Trace ascites. Electronically Signed   By: Monte Fantasia M.D.   On: 10/17/2017 08:35   Dg Chest Port 1 View  Result Date: 10/16/2017 CLINICAL DATA:  Acute kidney injury. History of metastatic cancer  and ureteral stents. EXAM: PORTABLE CHEST 1 VIEW COMPARISON:  Chest radiograph Mar 17, 2015 FINDINGS: Cardiac silhouette is mildly enlarged. Mediastinal silhouette is nonsuspicious. Moderate RIGHT and small LEFT pleural effusions with underlying consolidation. No pneumothorax. Soft tissue planes included osseous structures are nonsuspicious. IMPRESSION: Moderate RIGHT and small LEFT pleural effusions with underlying consolidation. Recommend follow-up chest radiograph after treatment to exclude underlying mass. Mild cardiomegaly. Electronically Signed   By: Elon Alas M.D.   On: 10/16/2017 20:02   Ir Nephrostomy Placement Right  Result Date: 10/17/2017 INDICATION: 83 year old female with a history of urothelial cancer. She has bilateral ureteral stents which were placed at an outside institution. Unfortunately, she has recurrent right-sided hydronephrosis and worsening renal function consistent with obstructed uropathy. Placement of a percutaneous nephrostomy tube is warranted for renal decompression. EXAM: IR NEPHROSTOMY PLACEMENT RIGHT COMPARISON:  None. MEDICATIONS: Ciprofloxacin 400 mg IV; The antibiotic was administered in an appropriate time frame prior to skin puncture. ANESTHESIA/SEDATION: Fentanyl 75 mcg IV; Versed 1.5 mg IV Moderate Sedation Time:  15 minutes The patient was continuously monitored during the procedure by the interventional radiology nurse under my direct supervision. CONTRAST:  15 mL Isovue-300 - administered into the collecting system(s) FLUOROSCOPY  TIME:  Fluoroscopy Time: 1 minutes 54 seconds (5 mGy). COMPLICATIONS: None immediate. PROCEDURE: Informed written consent was obtained from the patient after a thorough discussion of the procedural risks, benefits and alternatives. All questions were addressed. Maximal Sterile Barrier Technique was utilized including caps, mask, sterile gowns, sterile gloves, sterile drape, hand hygiene and skin antiseptic. A timeout was performed  prior to the initiation of the procedure. The right flank was interrogated with ultrasound. The right kidney is identified and is found to be hydronephrotic. A suitable calyx was selected. Local anesthesia was attained by infiltration with 1% lidocaine. A small dermatotomy was made. Under real-time sonographic guidance, a posterior interpolar calyx was punctured with a 21 gauge Accustick needle. There was return of clear urine. A hand injection of contrast material through the needle opacifies the renal collecting system. A 0.018 inch wire was advanced into the renal collecting system. The needle was removed. The Accustick sheath was advanced of the renal collecting system. The 0.018 inch wire was then exchanged for a 0.035 inch wire. The skin tract was dilated to 10 [place]. A Cook 10.2 [place] all-purpose drainage catheter was advanced over the wire and formed in the renal pelvis. A gentle hand injection of contrast material confirms placement within the renal pelvis. A fluoroscopic spot image was saved. The catheter was connected to gravity bag drainage and secured to the skin with 0 Prolene suture. The patient tolerated the procedure well. IMPRESSION: Successful placement of a 10 French right-sided percutaneous nephrostomy tube. Patient should return Interventional Radiology in 6- 8 weeks for nephrostomy tube check and exchange. Signed, Criselda Peaches, MD [facility] Electronically Signed   By: Jacqulynn Cadet M.D.   On: 10/17/2017 16:52   Medications: Infusions: . dextrose 5 % and 0.9% NaCl 50 mL/hr at 10/18/17 1540    Scheduled Medications: . calcitRIOL  0.25 mcg Oral Daily  . calcium acetate  1,334 mg Oral TID WC  . calcium-vitamin D  1 tablet Oral BID  . cholecalciferol  1,000 Units Oral Daily  . ferrous gluconate  325 mg Oral Q breakfast  . heparin  5,000 Units Subcutaneous Q8H  . hydrocortisone   Rectal BID  . levothyroxine  25  mcg Oral QAC breakfast  . metoprolol tartrate  12.5 mg Oral BID  . multivitamin with minerals  1 tablet Oral Daily  . vitamin B-12  1,000 mcg Oral Daily    have reviewed scheduled and prn medications.  Physical Exam: General: somnolent but arousable- husband  at bedside Heart: RRR Lungs: mostly clear Abdomen: soft, non tender- blood tinged urine from nephrostomy tube Extremities: pitting edema    10/18/2017,11:01 AM  LOS: 3 days

## 2017-10-18 NOTE — Telephone Encounter (Signed)
Appt has been scheduled for the pt to see Dr. Alen Blew on 12/14 at 2pm. Appt was given to her son [name] who's aware to arrive 30 minutes early.

## 2017-10-19 LAB — CBC WITH DIFFERENTIAL/PLATELET
BASOS ABS: 0 10*3/uL (ref 0.0–0.1)
Basophils Relative: 0 %
EOS PCT: 0 %
Eosinophils Absolute: 0 10*3/uL (ref 0.0–0.7)
HEMATOCRIT: 26.4 % — AB (ref 36.0–46.0)
Hemoglobin: 8.9 g/dL — ABNORMAL LOW (ref 12.0–15.0)
LYMPHS ABS: 1.1 10*3/uL (ref 0.7–4.0)
LYMPHS PCT: 13 %
MCH: 26.1 pg (ref 26.0–34.0)
MCHC: 33.7 g/dL (ref 30.0–36.0)
MCV: 77.4 fL — AB (ref 78.0–100.0)
MONO ABS: 0.5 10*3/uL (ref 0.1–1.0)
Monocytes Relative: 6 %
NEUTROS ABS: 6.9 10*3/uL (ref 1.7–7.7)
Neutrophils Relative %: 81 %
PLATELETS: 210 10*3/uL (ref 150–400)
RBC: 3.41 MIL/uL — ABNORMAL LOW (ref 3.87–5.11)
RDW: 15.9 % — AB (ref 11.5–15.5)
WBC: 8.4 10*3/uL (ref 4.0–10.5)

## 2017-10-19 LAB — BASIC METABOLIC PANEL
Anion gap: 19 — ABNORMAL HIGH (ref 5–15)
BUN: 93 mg/dL — ABNORMAL HIGH (ref 6–20)
CO2: 15 mmol/L — ABNORMAL LOW (ref 22–32)
Calcium: 7.6 mg/dL — ABNORMAL LOW (ref 8.9–10.3)
Chloride: 93 mmol/L — ABNORMAL LOW (ref 101–111)
Creatinine, Ser: 7.18 mg/dL — ABNORMAL HIGH (ref 0.44–1.00)
GFR calc non Af Amer: 5 mL/min — ABNORMAL LOW (ref >60)
GFR, EST AFRICAN AMERICAN: 5 mL/min — AB (ref >60)
Glucose, Bld: 84 mg/dL (ref 65–99)
Potassium: 4.4 mmol/L (ref 3.5–5.1)
SODIUM: 127 mmol/L — AB (ref 135–145)

## 2017-10-19 LAB — GLUCOSE, CAPILLARY: GLUCOSE-CAPILLARY: 93 mg/dL (ref 65–99)

## 2017-10-19 NOTE — Care Management Important Message (Signed)
Important Message  Patient Details  Name: Sharon [name] MRN: [id number] Date of Birth: 12-Oct-1932   Medicare Important Message Given:  Yes    [name] 10/19/2017, 10:26 AM

## 2017-10-19 NOTE — Progress Notes (Signed)
Daily Progress Note   Patient Name: Sharon [name]       Date: 10/19/2017 DOB: [birth year]  Age: 83 y.o. MRN#: [id number] Attending Physician: Tawni Millers Primary Care Physician: Carollee Herter, Alferd Apa, DO Admit Date: 11/03/2017  Reason for Consultation/Follow-up: Establishing goals of care  Subjective/GOC: Upon arrival to room, patient resting in chair. Denies pain or discomfort. Tells me she ate some today.   Husband and son, Sharon [name] at bedside. Again introduced palliative medicine. Discussed hospital diagnoses and interventions. Outpatient oncology appointment is scheduled. Discussed advanced directive packet and MOST form with Sharon [name]. He will discuss with his parents and interested in completing AD packet this hospitalization.   Answered questions and concerns. Emotional support provided.   Length of Stay: 4  Current Medications: Scheduled Meds:  . calcium-vitamin D  1 tablet Oral BID  . cholecalciferol  1,000 Units Oral Daily  . ferrous gluconate  325 mg Oral Q breakfast  . heparin  5,000 Units Subcutaneous Q8H  . hydrocortisone   Rectal BID  . levothyroxine  25 mcg Oral QAC breakfast  . metoprolol tartrate  12.5 mg Oral BID  . multivitamin with minerals  1 tablet Oral Daily  . nystatin  5 mL Mouth/Throat QID  . vitamin B-12  1,000 mcg Oral Daily    Continuous Infusions:   PRN Meds: acetaminophen, cyclobenzaprine, ondansetron, polyethylene glycol, zolpidem  Physical Exam  Constitutional: She is oriented to person, place, and time. She is easily aroused.  HENT:  Head: Normocephalic and atraumatic.  Cardiovascular: Regular rhythm.  Pulmonary/Chest: Effort normal.  Abdominal:  Right nephrostomy tube  Neurological: She is alert, oriented to person, place,  and time and easily aroused.  Skin: Skin is warm and dry.  Psychiatric:  HOH She is inattentive.  Nursing note and vitals reviewed.          Vital Signs: BP (!) 123/52 (BP Location: Right Arm)   Pulse 92   Temp 97.7 F (36.5 C) (Oral)   Resp 20   Ht 5\' 3"  (1.6 m)   Wt 68 kg (149 lb 14.6 oz)   SpO2 96%   BMI 26.56 kg/m  SpO2: SpO2: 96 % O2 Device: O2 Device: Not Delivered O2 Flow Rate:    Intake/output summary:   Intake/Output Summary (Last 24 hours) at 10/19/2017 1625 Last  data filed at 10/19/2017 0900 Gross per 24 hour  Intake 100 ml  Output 805 ml  Net -705 ml   LBM: Last BM Date: 10/18/17 Baseline Weight: Weight: 62.6 kg (138 lb) Most recent weight: Weight: 68 kg (149 lb 14.6 oz)       Palliative Assessment/Data: PPS 50%   Flowsheet Rows     Most Recent Value  Intake Tab  Referral Department  Hospitalist  Unit at Time of Referral  Med/Surg Unit  Palliative Care Primary Diagnosis  Cancer  Date Notified  10/17/17  Palliative Care Type  New Palliative care  Reason for referral  Clarify Goals of Care  Date of Admission  11/03/2017  Date first seen by Palliative Care  10/18/17  # of days Palliative referral response time  1 Day(s)  # of days IP prior to Palliative referral  2  Clinical Assessment  Palliative Performance Scale Score  50%  Psychosocial & Spiritual Assessment  Palliative Care Outcomes  Patient/Family meeting held?  Yes  Who was at the meeting?  patient, husband, son  Palliative Care Outcomes  Clarified goals of care, Provided psychosocial or spiritual support, ACP counseling assistance, Linked to palliative care logitudinal support      Patient Active Problem List   Diagnosis Date Noted  . Acute renal failure ([place])   . Palliative care by specialist   . Goals of care, counseling/discussion   . Urothelial cancer ([place]) 10/16/2017  . Metastatic urothelial carcinoma ([place])   . Iron deficiency anemia 11/01/2017  . Uremia 10/18/2017  .  Hyponatremia 11/07/2017  . Hyperkalemia 10/17/2017  . Metabolic acidosis 69/67/8938  . Hypocalcemia 10/30/2017  . Essential hypertension 08/19/2008  . LYMPHOMA 12/26/2006  . Hypothyroidism 12/26/2006  . OSTEOPENIA 12/26/2006    Palliative Care Assessment & Plan   Patient Profile: 83 y.o. female  with past medical history of thyroid disease, osteopenia, lymphoma, hypertension, CHF, acute renal insufficiency, and new diagnosis of metastatic urothelial cancer s/p stent placement admitted on 11/14/2017 with decreased urine output. In ED, labs revealed K+ 6.0, Creat 8.39, BUN 95, NA 119, and WBC's 13.7. CT abd/pelvis reveals mild bilateral hydronephrosis (right > left), left kidney atrophy, and numerous lesions throughout the liver suggestive of metastatic disease. Also extensive bony mets in spine and pelvis. Acute renal failure likely due to hydronephrosis s/p bilateral stents on 11/18. Urology and Nephrology following. Palliative medicine consultation for goals of care.  Assessment: Acute obstructive uropathy Acute kidney injury Metastatic urothelial cancer Hyponatremia Hyperkalemia Hypertension Hypothyroidism  Recommendations/Plan:  Continue FULL code/FULL scope  Introduced MOST form. Son will discuss with the patient.   Patient/son interested in completing advanced directive packet. Spiritual consult placed.   Patient/family awaiting outpatient oncology input.   May benefit from continued support from palliative services outpatient to further discuss [place].   Goals of Care and Additional Recommendations:  Limitations on Scope of Treatment: Full Scope Treatment  Code Status: FULL   Code Status Orders  (From admission, onward)        Start     Ordered   10/16/17 0043  Full code  Continuous     10/16/17 0043    Code Status History    Date Active Date Inactive Code Status Order ID Comments User Context   This patient has a current code status but no historical code  status.       Prognosis:   Unable to determine  Discharge Planning:  Home with [place] was discussed with  patient, husband, son at bedside  Thank you for allowing the Palliative Medicine Team to assist in the care of this patient.   Time In: 1510 Time Out: 1545 Total Time 36min Prolonged Time Billed no      Greater than 50%  of this time was spent counseling and coordinating care related to the above assessment and plan.  Ihor Dow, FNP-C Palliative Medicine Team  Phone: [phone] Fax: [phone]  Please contact Palliative Medicine Team phone at [phone] for questions and concerns.

## 2017-10-19 NOTE — Progress Notes (Signed)
Patient ID: Sharon [name], female   DOB: 06/02/1932, 83 y.o.   MRN: [id number]    Referring Physician(s): Dr. Irine Seal  Supervising Physician: Sandi Mariscal  Patient Status: [facility] - In-pt  Chief Complaint: Hydronephrosis, right  Subjective: Patient is very [place], but denies any complaints.  Says drain is draining well.  Allergies: Patient has no known allergies.  Medications: Prior to Admission medications   Medication Sig Start Date End Date Taking? Authorizing Provider  Calcium Carbonate-Vitamin D (CALCIUM + D) 600-200 MG-UNIT TABS Take 2 tablets by mouth daily.     Yes [provider]  cholecalciferol (VITAMIN D) 1000 UNITS tablet Take 1,000 Units by mouth daily.     Yes [provider]  cyclobenzaprine (FLEXERIL) 5 MG tablet Take 5 mg by mouth 3 (three) times daily as needed for muscle spasms.   Yes [provider]  ferrous gluconate (FERGON) 325 MG tablet Take 325 mg by mouth daily with breakfast.     Yes [provider]  Flaxseed, Linseed, (FLAX SEED OIL) 1000 MG CAPS Take 1 capsule by mouth daily.     Yes [provider]  furosemide (LASIX) 40 MG tablet Take 40 mg by mouth daily.   Yes [provider]  levothyroxine (SYNTHROID, LEVOTHROID) 25 MCG tablet 1 po qd 09/25/12  Yes Roma Schanz R, DO  metoprolol tartrate (LOPRESSOR) 25 MG tablet Take 25 mg by mouth 2 (two) times daily.   Yes [provider]  Multiple Vitamin (MULTIVITAMIN) tablet Take 1 tablet by mouth daily.     Yes [provider]  vitamin B-12 (CYANOCOBALAMIN) 1000 MCG tablet Take 1,000 mcg by mouth daily.     Yes [provider]  zoster vaccine live, PF, (ZOSTAVAX) 27035 UNT/0.65ML injection Inject 19,400 Units into the skin once. 09/25/12  Yes Roma Schanz R, DO  alendronate (FOSAMAX) 70 MG tablet Take with a full glass of water on an empty stomach. Patient not taking: Reported on 11/11/2017 09/25/12   Carollee Herter,  Alferd Apa, DO  ciprofloxacin (CIPRO) 250 MG tablet Take 1 tablet (250 mg total) by mouth 2 (two) times daily. Patient not taking: Reported on 10/29/2017 09/27/12   Carollee Herter, Alferd Apa, DO  lisinopril-hydrochlorothiazide (PRINZIDE,ZESTORETIC) 10-12.5 MG per tablet 1 tab by mouth daily--Office visit due now Patient not taking: Reported on 11/12/2017 09/21/13   Carollee Herter, Alferd Apa, DO    Vital Signs: BP (!) 104/47   Pulse 83   Temp (!) 97.4 F (36.3 C) (Oral)   Resp 18   Ht 5\' 3"  (1.6 m)   Wt 149 lb 14.6 oz (68 kg)   SpO2 95%   BMI 26.56 kg/m   Physical Exam: Abd: right PCN in place and site is c/d/i.  Bag with clear yellow urine.  Imaging: Ct Abdomen Pelvis Wo Contrast  Result Date: 10/16/2017 CLINICAL DATA:  Hydronephrosis. History of bilateral renal stents through hydronephrosis. Worsening acute renal failure. EXAM: CT ABDOMEN AND PELVIS WITHOUT CONTRAST TECHNIQUE: Multidetector CT imaging of the abdomen and pelvis was performed following the standard protocol without IV contrast. COMPARISON:  Ultrasound 10/30/2017 FINDINGS: Lower chest: Large bilateral pleural effusions with compressive atelectasis or pneumonia in the lower lobes. Heart is normal size. Hepatobiliary: Numerous ill-defined masses throughout the liver. There are morphologic changes of cirrhosis with nodular contours. Small gallstones layering in the gallbladder. Pancreas: No focal abnormality or ductal dilatation. Spleen: No focal abnormality.  Normal size. Adrenals/Urinary Tract: Bilateral ureteral stents are in place  in the within the bladder. Mild bilateral hydronephrosis, right greater than left. Left kidney is atrophic. Stomach/Bowel: Sigmoid diverticulosis. No active diverticulitis. No evidence of bowel obstruction. Vascular/Lymphatic: Diffuse aortic and iliac calcifications. No aneurysm or adenopathy. Reproductive: Prior hysterectomy.  No adnexal masses. Other: Small amount of free fluid in the pelvis and around the  liver and spleen. No free air. Extensive edema throughout the subcutaneous soft tissues. Musculoskeletal: Extensive sclerotic lesions throughout the thoracic and lumbar spine and sacrum compatible with sclerotic metastases. IMPRESSION: Numerous lesions throughout the liver most suggestive of metastatic disease. Morphologic features of the liver suggests cirrhosis. Extensive bony metastatic disease in the spine and pelvis compatible with sclerotic bone metastases. Mild bilateral hydronephrosis, right greater than left. Left kidney is atrophic. Large bilateral pleural effusions with bilateral lower lobe atelectasis or pneumonia. Cholelithiasis. Sigmoid diverticulosis. Electronically Signed   By: Rolm Baptise M.D.   On: 10/16/2017 15:12   US Renal  Result Date: 10/17/2017 CLINICAL DATA:  Bilateral hydronephrosis EXAM: RENAL / URINARY TRACT ULTRASOUND COMPLETE COMPARISON:  Abdominal CT from yesterday FINDINGS: Right Kidney: Length: 13 cm. Echogenic with moderate hydronephrosis. The known ureteral stent is difficult to visualize. No mass lesion. Left Kidney: Length: 9 cm, which is overestimated relative to abdominal CT from yesterday (8cm). Echogenic cortex with diffuse cortical thinning. Mild hydronephrosis. Known ureteral stent is difficult to visualize. Bladder: Decompressed. Trace ascites.  Known pleural effusions. IMPRESSION: 1. Moderate right and mild left hydronephrosis that is stable from CT yesterday. 2. Decompressed urinary bladder. 3. Chronic medical renal disease with asymmetric left renal atrophy. 4. Trace ascites. Electronically Signed   By: Monte Fantasia M.D.   On: 10/17/2017 08:35   US Renal  Result Date: 10/30/2017 CLINICAL DATA:  Renal failure. EXAM: RENAL / URINARY TRACT ULTRASOUND COMPLETE COMPARISON:  None. FINDINGS: Right Kidney: Length: 13.2 cm. Rounded regions of fluid in the left kidney are favored to represent caliectasis. Renal cysts considered less likely. No pelviectasis. No solid  mass noted. Left Kidney: Length: 11.5 cm. The left kidney is not well visualized or characterized. Bladder: Decompressed. IMPRESSION: 1. Probable caliectasis in the right kidney. Renal cysts are considered less likely. No gross hydronephrosis with no pelviectasis. 2. The left kidney is not well visualized. 3. The bladder is poorly evaluated due to lack of distention. 4. A small amount of free fluid is seen in the right upper quadrant and pelvis, nonspecific. Electronically Signed   By: Dorise Bullion III M.D   On: 10/24/2017 21:06   Dg Chest Port 1 View  Result Date: 10/16/2017 CLINICAL DATA:  Acute kidney injury. History of metastatic cancer and ureteral stents. EXAM: PORTABLE CHEST 1 VIEW COMPARISON:  Chest radiograph Mar 17, 2015 FINDINGS: Cardiac silhouette is mildly enlarged. Mediastinal silhouette is nonsuspicious. Moderate RIGHT and small LEFT pleural effusions with underlying consolidation. No pneumothorax. Soft tissue planes included osseous structures are nonsuspicious. IMPRESSION: Moderate RIGHT and small LEFT pleural effusions with underlying consolidation. Recommend follow-up chest radiograph after treatment to exclude underlying mass. Mild cardiomegaly. Electronically Signed   By: Elon Alas M.D.   On: 10/16/2017 20:02   Ir Nephrostomy Placement Right  Result Date: 10/17/2017 INDICATION: 83 year old female with a history of urothelial cancer. She has bilateral ureteral stents which were placed at an outside institution. Unfortunately, she has recurrent right-sided hydronephrosis and worsening renal function consistent with obstructed uropathy. Placement of a percutaneous nephrostomy tube is warranted for renal decompression. EXAM: IR NEPHROSTOMY PLACEMENT RIGHT COMPARISON:  None. MEDICATIONS: Ciprofloxacin 400 mg IV;  The antibiotic was administered in an appropriate time frame prior to skin puncture. ANESTHESIA/SEDATION: Fentanyl 75 mcg IV; Versed 1.5 mg IV Moderate Sedation Time:  15  minutes The patient was continuously monitored during the procedure by the interventional radiology nurse under my direct supervision. CONTRAST:  15 mL Isovue-300 - administered into the collecting system(s) FLUOROSCOPY TIME:  Fluoroscopy Time: 1 minutes 54 seconds (5 mGy). COMPLICATIONS: None immediate. PROCEDURE: Informed written consent was obtained from the patient after a thorough discussion of the procedural risks, benefits and alternatives. All questions were addressed. Maximal Sterile Barrier Technique was utilized including caps, mask, sterile gowns, sterile gloves, sterile drape, hand hygiene and skin antiseptic. A timeout was performed prior to the initiation of the procedure. The right flank was interrogated with ultrasound. The right kidney is identified and is found to be hydronephrotic. A suitable calyx was selected. Local anesthesia was attained by infiltration with 1% lidocaine. A small dermatotomy was made. Under real-time sonographic guidance, a posterior interpolar calyx was punctured with a 21 gauge Accustick needle. There was return of clear urine. A hand injection of contrast material through the needle opacifies the renal collecting system. A 0.018 inch wire was advanced into the renal collecting system. The needle was removed. The Accustick sheath was advanced of the renal collecting system. The 0.018 inch wire was then exchanged for a 0.035 inch wire. The skin tract was dilated to 10 [place]. A Cook 10.2 [place] all-purpose drainage catheter was advanced over the wire and formed in the renal pelvis. A gentle hand injection of contrast material confirms placement within the renal pelvis. A fluoroscopic spot image was saved. The catheter was connected to gravity bag drainage and secured to the skin with 0 Prolene suture. The patient tolerated the procedure well. IMPRESSION: Successful placement of a 10 French right-sided percutaneous nephrostomy tube. Patient should return Interventional  Radiology in 6- 8 weeks for nephrostomy tube check and exchange. Signed, Criselda Peaches, MD [facility] Radiology Electronically Signed   By: Jacqulynn Cadet M.D.   On: 10/17/2017 16:52    Labs:  CBC: Recent Labs    10/16/17 0445 10/17/17 0341 10/18/17 0301 10/19/17 0642  WBC 11.7* 9.7 9.0 8.4  HGB 9.0* 9.3* 9.0* 8.9*  HCT 26.3* 26.6* 25.9* 26.4*  PLT 265 244 212 210    COAGS: Recent Labs    10/17/17 1409  INR 1.28  APTT 43*    BMP: Recent Labs    10/16/17 0445 10/17/17 0341 10/18/17 0301 10/19/17 0642  NA 119* 123* 123* 127*  K 5.4* 5.2* 4.6 4.4  CL 86* 88* 89* 93*  CO2 16* 17* 14* 15*  GLUCOSE 94 62* 85 84  BUN 94* 105* 100* 93*  CALCIUM 6.3* 6.4* 6.6* 7.6*  CREATININE 8.75* 9.15* 8.63* 7.18*  GFRNONAA 4* 3* 4* 5*  GFRAA 4* 4* 4* 5*    LIVER FUNCTION TESTS: No results for input(s): BILITOT, AST, ALT, ALKPHOS, PROT, ALBUMIN in the last 8760 hours.  Assessment and Plan: 1. Right hydronephrosis, s/p PCN  Drain in place and draining well. Per Dr. Jeffie Pollock, timing to go back and exchange stents etc will be determine by him. Will follow  Electronically Signed: Henreitta Cea 10/19/2017, 12:05 PM   I spent a total of 15 Minutes at the the patient's bedside AND on the patient's hospital floor or unit, greater than 50% of which was counseling/coordinating care for right hydronephrosis secondary to obstructed stents.

## 2017-10-19 NOTE — Progress Notes (Signed)
PROGRESS NOTE    Sharon [name]  NID:[id number] DOB: [birth year] DOA: 10/30/2017 PCP: Ann Held, DO    Brief Narrative:  83 year old female who presented with decreased urine output. Patient does have significant past history for hypertension, hypothyroidism, iron deficiency anemia and recently diagnosed metastatic urothelial cancer, status post bilateral ureter stent placements due to obstructive uropathy (discharged 1 week prior to hospitalization). Patient was noted to have a drastic decrease in urine output, associated with worsening lower extremity edema. Her blood pressure was 142/52, heart rate 73, respiratory rate 18, temperature 98.3, oxygen saturations 95%. Moist mucous membranes, lungs clear to auscultation bilaterally, heart S1-S2 present rhythmic, abdomen soft, 3+ pitting lower sternal edema. Sodium 119, potassium 6.0, chloride 84, bicarbonate 16, glucose 125, BUN 95, creatinine 8.39, calcium 6.2, white cell count 13.7, hemoglobin 9.9, hematocrit 27.7, platelets 253. Urinalysis with too numerous, RBCs, and too numerous to count white cells. Chest x-ray with right rotation, bilateral pleural effusions more right and left.   Patient was admitted to hospital with the working diagnosis acute obstructive uropathy, complicated with uremia, metabolic acidosis, hyperkalemia and hyponatremia.  Assessment & Plan:   Principal Problem:   Uremia Active Problems:   Hypothyroidism   Essential hypertension   Iron deficiency anemia   Hyponatremia   Hyperkalemia   Metabolic acidosis   Hypocalcemia   Urothelial cancer ([place])   Acute renal failure ([place])   Palliative care by specialist   Goals of care, counseling/discussion   1. Acute obstructive uropathy with acute kidney injury with hyponatremia, hyperkalemia and metabolic acidosis. Patient sp right percutaneous nephrostomy tube placement, urine output 2,205 ml over last 24 hours. Serum cr trending down to 7.18, Na up to 127  from 123 and serum bicarbonate at 15. Serum potassium 4,4. Will follow on renal panel in am, avoid nephrotoxic medications, expect post obstructive polyuria.   2. Metastatic urothelial cancer. Patient will follow up as outpatient with oncology Dr. Learta Codding. Continue pain control, out of bed as tolerated and physical therapy evaluation.   3. Hypertension. Continue blood pressure control with metoprolol, holding am dose due to risk of hypotension. Continue to hold on furosemide, lisinopril and hztc. Systolic blood pressure 90 to 110 mmHg.   4. Hypothyroidism. Continue levothyroxine per home regimen.   5. Iron deficiency anemia. Hb and hct are stable, will continue iron supplementation, no need for prbc transfusion.    DVT prophylaxis: heparin  Code Status: full Family Communication: I spoke with patient's husband at the bedside and all questions were addressed. Disposition Plan: home   Consultants:   IR  Nephrology   Procedures:   IR right percutaneous nephrostomy tube.   Antimicrobials:       Subjective: Positive back pain and generalized weakness, no nausea or vomiting, no chest pain or dyspnea. Has not been out of bed yet.   Objective: Vitals:   10/18/17 2113 10/18/17 2202 10/19/17 0508 10/19/17 0955  BP: (!) 89/52 (!) 90/48 (!) 96/53 (!) 104/47  Pulse: 83 84 81 83  Resp: 18 18 18    Temp: 98.3 F (36.8 C)  (!) 97.4 F (36.3 C)   TempSrc: Oral  Oral   SpO2: 95%  95%   Weight:   68 kg (149 lb 14.6 oz)   Height:        Intake/Output Summary (Last 24 hours) at 10/19/2017 1018 Last data filed at 10/19/2017 0804 Gross per 24 hour  Intake 120 ml  Output 1755 ml  Net -1635 ml  Filed Weights   10/17/17 0501 10/18/17 0116 10/19/17 0508  Weight: 70.5 kg (155 lb 6.8 oz) 68.9 kg (151 lb 14.4 oz) 68 kg (149 lb 14.6 oz)    Examination:   General: Not in pain or dyspnea, deconditioned Neurology: Awake and alert, non focal  E ENT: positive pallor, no icterus, oral  mucosa dry, positive tongue ucler Cardiovascular: No JVD. S1-S2 present, rhythmic, no gallops, rubs, or murmurs. +++ pitting lower extremity edema and depending edema, hips and lower back.  Pulmonary: decreased breath sounds bilaterally at the dependent, adequate air movement at the apical zones, no wheezing, rhonchi or rales. Gastrointestinal. Abdomen mild distended, no organomegaly, non tender, no rebound or guarding Skin. No rashes Musculoskeletal: no joint deformities     Data Reviewed: I have personally reviewed following labs and imaging studies  CBC: Recent Labs  Lab 11/04/2017 1758 10/16/17 0445 10/17/17 0341 10/18/17 0301 10/19/17 0642  WBC 13.7* 11.7* 9.7 9.0 8.4  NEUTROABS  --   --  8.3* 7.7 6.9  HGB 9.9* 9.0* 9.3* 9.0* 8.9*  HCT 27.7* 26.3* 26.6* 25.9* 26.4*  MCV 76.7* 77.6* 75.8* 76.9* 77.4*  PLT 253 265 244 212 623   Basic Metabolic Panel: Recent Labs  Lab 10/16/17 0005 10/16/17 0445 10/16/17 1003 10/17/17 0341 10/18/17 0301 10/19/17 0642  NA 118* 119*  --  123* 123* 127*  K 5.7* 5.4*  --  5.2* 4.6 4.4  CL 84* 86*  --  88* 89* 93*  CO2 15* 16*  --  17* 14* 15*  GLUCOSE 94 94  --  62* 85 84  BUN 96* 94*  --  105* 100* 93*  CREATININE 8.67* 8.75*  --  9.15* 8.63* 7.18*  CALCIUM 6.0* 6.3*  --  6.4* 6.6* 7.6*  MG  --   --  1.9  --   --   --   PHOS  --   --  5.6*  --   --   --    GFR: Estimated Creatinine Clearance: 5.5 mL/min (A) (by C-G formula based on SCr of 7.18 mg/dL (H)). Liver Function Tests: No results for input(s): AST, ALT, ALKPHOS, BILITOT, PROT, ALBUMIN in the last 168 hours. No results for input(s): LIPASE, AMYLASE in the last 168 hours. No results for input(s): AMMONIA in the last 168 hours. Coagulation Profile: Recent Labs  Lab 10/17/17 1409  INR 1.28   Cardiac Enzymes: No results for input(s): CKTOTAL, CKMB, CKMBINDEX, TROPONINI in the last 168 hours. BNP (last 3 results) No results for input(s): PROBNP in the last 8760  hours. HbA1C: No results for input(s): HGBA1C in the last 72 hours. CBG: Recent Labs  Lab 10/17/17 0811 10/17/17 0858 10/17/17 1209 10/18/17 1434 10/19/17 0739  GLUCAP 57* 130* 79 133* 93   Lipid Profile: No results for input(s): CHOL, HDL, LDLCALC, TRIG, CHOLHDL, LDLDIRECT in the last 72 hours. Thyroid Function Tests: No results for input(s): TSH, T4TOTAL, FREET4, T3FREE, THYROIDAB in the last 72 hours. Anemia Panel: No results for input(s): VITAMINB12, FOLATE, FERRITIN, TIBC, IRON, RETICCTPCT in the last 72 hours.    Radiology Studies: I have reviewed all of the imaging during this hospital visit personally     Scheduled Meds: . calcitRIOL  0.25 mcg Oral Daily  . calcium acetate  1,334 mg Oral TID WC  . calcium-vitamin D  1 tablet Oral BID  . cholecalciferol  1,000 Units Oral Daily  . ferrous gluconate  325 mg Oral Q breakfast  . heparin  5,000 Units  Subcutaneous Q8H  . hydrocortisone   Rectal BID  . levothyroxine  25 mcg Oral QAC breakfast  . metoprolol tartrate  12.5 mg Oral BID  . multivitamin with minerals  1 tablet Oral Daily  . nystatin  5 mL Mouth/Throat QID  . vitamin B-12  1,000 mcg Oral Daily   Continuous Infusions:   LOS: 4 days        Kindel Rochefort Gerome Apley, MD [facility] Pager [phone]

## 2017-10-19 NOTE — Evaluation (Signed)
Physical Therapy Evaluation Patient Details Name: Sharon [name] MRN: [id number] DOB: 15-Apr-1932 Today's Date: 10/19/2017   History of Present Illness  Patient is an 83 y/o female admitted to hospital with the working diagnosis acute obstructive uropathy, complicated with uremia, metabolic acidosis, hyperkalemia and hyponatremia.  Patient does have significant past history for hypertension, hypothyroidism, iron deficiency anemia and recently diagnosed metastatic urothelial cancer, status post bilateral ureter stent placements due to obstructive uropathy.  R percutaneous nephrostomy placed 12/3.  Clinical Impression  Patient presents with decreased independence with mobility due to weakness, poor activity tolerance, decreased balance and will benefit from skilled PT in the acute setting to allow return home with family support and [facility] services.      Follow Up Recommendations Home health PT;Supervision/Assistance - 24 hour(HH aide)    Equipment Recommendations  None recommended by PT    Recommendations for Other Services       Precautions / Restrictions Precautions Precautions: Fall Precaution Comments: R nephrostomy      Mobility  Bed Mobility Overal bed mobility: Needs Assistance Bed Mobility: Supine to Sit     Supine to sit: HOB elevated;Mod assist     General bed mobility comments: lifting trunk and scooting hips to EOB  Transfers Overall transfer level: Needs assistance Equipment used: Rolling walker (2 wheeled) Transfers: Sit to/from [facility] Sit to Stand: Min assist;+2 physical assistance Stand pivot transfers: Min assist;Mod assist       General transfer comment: son presents and helped lift her off bed, pt took pivotal steps with walker min to mod A for walker management and increased time  Ambulation/Gait                Stairs            Wheelchair Mobility    Modified Rankin (Stroke Patients Only)       Balance  Overall balance assessment: Needs assistance   Sitting balance-Leahy Scale: Fair Sitting balance - Comments: sitting EOB to don sock on L foot mod A to keep foot crossed over knee and to get sock over toes   Standing balance support: Bilateral upper extremity supported Standing balance-Leahy Scale: Poor Standing balance comment: UE support for balance                             Pertinent Vitals/Pain Pain Assessment: Faces Faces Pain Scale: Hurts little more Pain Location: bottoms of her feet are sore with weight bearing, other than that reports no pain Pain Descriptors / Indicators: Tender;Sore Pain Intervention(s): Repositioned;Monitored during session    [place] expects to be discharged to:: Private residence Living Arrangements: Spouse/significant other;Children Available Help at Discharge: Family;Available 24 hours/day Type of Home: House Home Access: Stairs to enter   [employer] of Steps: 1 Home Layout: One level Home Equipment: Walker - 2 wheels;Bedside commode;Grab bars - tub/shower Additional Comments: son reports wheelchair ordered throught home health; pt sleeps in recliner    Prior Function Level of Independence: Needs assistance   Gait / Transfers Assistance Needed: has needed assist since fell ill before Thanksgiving, was independent prior to this illness           Hand Dominance        Extremity/Trunk Assessment   Upper Extremity Assessment Upper Extremity Assessment: Generalized weakness    Lower Extremity Assessment Lower Extremity Assessment: RLE deficits/detail;LLE deficits/detail RLE Deficits / Details: AROM grossly WFL, but edema in LE's;  reports stiffness, but due to bedrest LLE Deficits / Details: AROM grossly WFL, but edema in LE's; reports stiffness, but due to bedrest    Cervical / Trunk Assessment Cervical / Trunk Assessment: Kyphotic  Communication   Communication: HOH  Cognition  Arousal/Alertness: Awake/alert Behavior During Therapy: WFL for tasks assessed/performed Overall Cognitive Status: Within Functional Limits for tasks assessed                                        General Comments General comments (skin integrity, edema, etc.): son in room, reports between himself, his brother and their cousins there is assist in the home and pt's spouse there as well, but seems not able to help much physically    Exercises     Assessment/Plan    PT Assessment Patient needs continued PT services  PT Problem List Decreased strength;Decreased mobility;Decreased knowledge of use of DME;Decreased balance;Decreased activity tolerance       PT Treatment Interventions DME instruction;Therapeutic activities;Gait training;Therapeutic exercise;Patient/family education;Balance training;Functional mobility training    PT Goals (Current goals can be found in the Care Plan section)  Acute Rehab PT Goals Patient Stated Goal: To return home with assist PT Goal Formulation: With patient/family Time For Goal Achievement: 11/02/17 Potential to Achieve Goals: Good    Frequency Min 3X/week   Barriers to discharge        Co-evaluation               AM-PAC PT "6 Clicks" Daily Activity  Outcome Measure Difficulty turning over in bed (including adjusting bedclothes, sheets and blankets)?: A Lot Difficulty moving from lying on back to sitting on the side of the bed? : Unable Difficulty sitting down on and standing up from a chair with arms (e.g., wheelchair, bedside commode, etc,.)?: Unable Help needed moving to and from a bed to chair (including a wheelchair)?: A Lot Help needed walking in hospital room?: Total Help needed climbing 3-5 steps with a railing? : Total 6 Click Score: 8    End of Session Equipment Utilized During Treatment: Gait belt Activity Tolerance: Patient limited by fatigue Patient left: in chair;with family/visitor present;with call  bell/phone within reach   PT Visit Diagnosis: Difficulty in walking, not elsewhere classified (R26.2);Muscle weakness (generalized) (M62.81)    Time: 1540-0867 PT Time Calculation (min) (ACUTE ONLY): 30 min   Charges:   PT Evaluation $PT Eval Moderate Complexity: 1 Mod PT Treatments $Therapeutic Activity: 8-22 mins   PT G CodesMagda Kiel, [place] [phone] 10/19/2017   Reginia Naas 10/19/2017, 3:10 PM

## 2017-10-19 NOTE — Progress Notes (Signed)
Subjective:  2200 UOP - crt down  to 7.2.  A little more alert today Objective Vital signs in last 24 hours: Vitals:   10/18/17 2113 10/18/17 2202 10/19/17 0508 10/19/17 0955  BP: (!) 89/52 (!) 90/48 (!) 96/53 (!) 104/47  Pulse: 83 84 81 83  Resp: 18 18 18    Temp: 98.3 F (36.8 C)  (!) 97.4 F (36.3 C)   TempSrc: Oral  Oral   SpO2: 95%  95%   Weight:   68 kg (149 lb 14.6 oz)   Height:       Weight change: -0.9 kg (-15.8 oz)  Intake/Output Summary (Last 24 hours) at 10/19/2017 1131 Last data filed at 10/19/2017 0900 Gross per 24 hour  Intake 220 ml  Output 1755 ml  Net -1535 ml    Assessment/ Plan: Pt is a 83 y.o. yo female who was admitted on 10/23/2017 with weakness- recent dx of metastatic urothelial CA- s/p bilat ureteral stents but now with cont hydro and AKI  Assessment/Plan: 1. Acute renal failure - recurrent issue w/ recent bilat ureteral stent placement in [place] 3 wks ago.  Repeat  CT showed an atrophic L kidney , may not be functional. Right kidney appeared obstructed- now s/p nephrost tube with brisk UOP and crt down. No absolute indication for acute dialysis and would hesitate to use in this setting as do not know prognosis- I hope to see cont improvement in renal function .   2. HTN/volume- if anything overloaded- BP is fine - low dose lopressor- stop IVF 3. Anemia- dropping some- supportive care 4. Hypocalcemia- now on calc and vit D- improving 5. Hyponatremia- likely due to volume overload- is improved from yest     [place]: Basic Metabolic Panel: Recent Labs  Lab 10/16/17 1003 10/17/17 0341 10/18/17 0301 10/19/17 0642  NA  --  123* 123* 127*  K  --  5.2* 4.6 4.4  CL  --  88* 89* 93*  CO2  --  17* 14* 15*  GLUCOSE  --  62* 85 84  BUN  --  105* 100* 93*  CREATININE  --  9.15* 8.63* 7.18*  CALCIUM  --  6.4* 6.6* 7.6*  PHOS 5.6*  --   --   --    Liver Function Tests: No results for input(s): AST, ALT, ALKPHOS, BILITOT,  PROT, ALBUMIN in the last 168 hours. No results for input(s): LIPASE, AMYLASE in the last 168 hours. No results for input(s): AMMONIA in the last 168 hours. CBC: Recent Labs  Lab 10/23/2017 1758 10/16/17 0445 10/17/17 0341 10/18/17 0301 10/19/17 0642  WBC 13.7* 11.7* 9.7 9.0 8.4  NEUTROABS  --   --  8.3* 7.7 6.9  HGB 9.9* 9.0* 9.3* 9.0* 8.9*  HCT 27.7* 26.3* 26.6* 25.9* 26.4*  MCV 76.7* 77.6* 75.8* 76.9* 77.4*  PLT 253 265 244 212 210   Cardiac Enzymes: No results for input(s): CKTOTAL, CKMB, CKMBINDEX, TROPONINI in the last 168 hours. CBG: Recent Labs  Lab 10/17/17 0811 10/17/17 0858 10/17/17 1209 10/18/17 1434 10/19/17 0739  GLUCAP 57* 130* 79 133* 93    Iron Studies: No results for input(s): IRON, TIBC, TRANSFERRIN, FERRITIN in the last 72 hours. Studies/Results: Ir Nephrostomy Placement Right  Result Date: 10/17/2017 INDICATION: 83 year old female with a history of urothelial cancer. She has bilateral ureteral stents which were placed at an outside institution. Unfortunately, she has recurrent right-sided hydronephrosis and worsening renal function consistent with obstructed uropathy. Placement of  a percutaneous nephrostomy tube is warranted for renal decompression. EXAM: IR NEPHROSTOMY PLACEMENT RIGHT COMPARISON:  None. MEDICATIONS: Ciprofloxacin 400 mg IV; The antibiotic was administered in an appropriate time frame prior to skin puncture. ANESTHESIA/SEDATION: Fentanyl 75 mcg IV; Versed 1.5 mg IV Moderate Sedation Time:  15 minutes The patient was continuously monitored during the procedure by the interventional radiology nurse under my direct supervision. CONTRAST:  15 mL Isovue-300 - administered into the collecting system(s) FLUOROSCOPY TIME:  Fluoroscopy Time: 1 minutes 54 seconds (5 mGy). COMPLICATIONS: None immediate. PROCEDURE: Informed written consent was obtained from the patient after a thorough discussion of the procedural risks, benefits and alternatives. All  questions were addressed. Maximal Sterile Barrier Technique was utilized including caps, mask, sterile gowns, sterile gloves, sterile drape, hand hygiene and skin antiseptic. A timeout was performed prior to the initiation of the procedure. The right flank was interrogated with ultrasound. The right kidney is identified and is found to be hydronephrotic. A suitable calyx was selected. Local anesthesia was attained by infiltration with 1% lidocaine. A small dermatotomy was made. Under real-time sonographic guidance, a posterior interpolar calyx was punctured with a 21 gauge Accustick needle. There was return of clear urine. A hand injection of contrast material through the needle opacifies the renal collecting system. A 0.018 inch wire was advanced into the renal collecting system. The needle was removed. The Accustick sheath was advanced of the renal collecting system. The 0.018 inch wire was then exchanged for a 0.035 inch wire. The skin tract was dilated to 10 [place]. A Cook 10.2 [place] all-purpose drainage catheter was advanced over the wire and formed in the renal pelvis. A gentle hand injection of contrast material confirms placement within the renal pelvis. A fluoroscopic spot image was saved. The catheter was connected to gravity bag drainage and secured to the skin with 0 Prolene suture. The patient tolerated the procedure well. IMPRESSION: Successful placement of a 10 French right-sided percutaneous nephrostomy tube. Patient should return Interventional Radiology in 6- 8 weeks for nephrostomy tube check and exchange. Signed, Criselda Peaches, MD [facility] Radiology Electronically Signed   By: Jacqulynn Cadet M.D.   On: 10/17/2017 16:52   Medications: Infusions:   Scheduled Medications: . calcitRIOL  0.25 mcg Oral Daily  . calcium acetate  1,334 mg Oral TID WC  . calcium-vitamin D  1 tablet Oral BID  . cholecalciferol  1,000 Units Oral Daily   . ferrous gluconate  325 mg Oral Q breakfast  . heparin  5,000 Units Subcutaneous Q8H  . hydrocortisone   Rectal BID  . levothyroxine  25 mcg Oral QAC breakfast  . metoprolol tartrate  12.5 mg Oral BID  . multivitamin with minerals  1 tablet Oral Daily  . nystatin  5 mL Mouth/Throat QID  . vitamin B-12  1,000 mcg Oral Daily    have reviewed scheduled and prn medications.  Physical Exam: General: somnolent but arousable- trying to talk but not clear- husband  at bedside- HOH and possibly some dementia Heart: RRR Lungs: mostly clear Abdomen: soft, non tender- blood tinged urine from nephrostomy tube- sis clearing Extremities: pitting edema    10/19/2017,11:31 AM  LOS: 4 days

## 2017-10-19 NOTE — Evaluation (Signed)
Occupational Therapy Evaluation Patient Details Name: Sharon [name] MRN: [id number] DOB: [birth year] Today's Date: 10/19/2017    History of Present Illness Patient is an 83 y/o female admitted to hospital with the working diagnosis acute obstructive uropathy, complicated with uremia, metabolic acidosis, hyperkalemia and hyponatremia.  Patient does have significant past history for hypertension, hypothyroidism, iron deficiency anemia and recently diagnosed metastatic urothelial cancer, status post bilateral ureter stent placements due to obstructive uropathy.  R percutaneous nephrostomy placed 12/3.   Clinical Impression   Pt presents with above diagnosis. Full OT list below, presents with generalized weakness, decreased activity tolerance and decreased independence in ADL and functional transfers. Prior to initial hospitalization (approx 3 weeks ago) Pt was very independent and was caring for son who is paralyzed (including lifting, etc). Pt is currently mod to max A for LB ADL and set up for upper body ADL and mod assist required for transfers.  Pt will benefit from skilled OT in the acute setting prior to [facility] follow up so that Pt can return home with family and previous [place] assist. Next session to focus on establishing HEP for BUE and introducing energy conservation education. I think it would also be helpful to work on caregiver education. Family will make a more definite plan after consulting with oncology to find out what their options for treatment are.     Follow Up Recommendations  Home health OT;Supervision/Assistance - 24 hour    Equipment Recommendations  None recommended by OT    Recommendations for Other Services       Precautions / Restrictions Precautions Precautions: Fall Precaution Comments: R nephrostomy Restrictions Weight Bearing Restrictions: No      Mobility Bed Mobility Overal bed mobility: Needs Assistance Bed Mobility: Supine to Sit     Supine to sit:  HOB elevated;Mod assist     General bed mobility comments: Pt OOB in recliner when OT arrived  Transfers Overall transfer level: Needs assistance Equipment used: Rolling walker (2 wheeled) Transfers: Sit to/from [facility] Sit to Stand: Min assist;+2 physical assistance Stand pivot transfers: Min assist;Mod assist       General transfer comment: deferred at this time, Family and Pt wished to remain in chair    Balance Overall balance assessment: Needs assistance Sitting-balance support: No upper extremity supported Sitting balance-Leahy Scale: Fair Sitting balance - Comments: able to bring trunk forward for oral care and sit with no back support - able to adjust socks with mod A sitting in recliner   Standing balance support: Bilateral upper extremity supported Standing balance-Leahy Scale: Poor Standing balance comment: UE support for balance                           ADL either performed or assessed with clinical judgement   ADL Overall ADL's : Needs assistance/impaired Eating/Feeding: Set up;Sitting;Bed level Eating/Feeding Details (indicate cue type and reason): able to bring hand to mouth for feeding, and hold utensils Grooming: Oral care;Wash/dry face;Set up;Sitting Grooming Details (indicate cue type and reason): in recliner Upper Body Bathing: Moderate assistance   Lower Body Bathing: Total assistance   Upper Body Dressing : Maximal assistance   Lower Body Dressing: Total assistance   Toilet Transfer: Moderate assistance;+2 for safety/equipment;Stand-pivot   Toileting- Clothing Manipulation and Hygiene: Maximal assistance;+2 for safety/equipment;Sit to/from stand Toileting - Clothing Manipulation Details (indicate cue type and reason): foley in place currently     Functional mobility during ADLs: (deferred this session)  General ADL Comments: greatly decreased activity tolerance     Vision Baseline Vision/History: Wears  glasses Wears Glasses: At all times Patient Visual Report: No change from baseline       Perception     Praxis      Pertinent Vitals/Pain Pain Assessment: Faces Pain Score: 0-No pain Faces Pain Scale: Hurts little more Pain Location: bottoms of her feet are sore with weight bearing, other than that reports no pain Pain Descriptors / Indicators: Tender;Sore Pain Intervention(s): Monitored during session     Hand Dominance Right   Extremity/Trunk Assessment Upper Extremity Assessment Upper Extremity Assessment: Generalized weakness   Lower Extremity Assessment Lower Extremity Assessment: Defer to PT evaluation RLE Deficits / Details: AROM grossly WFL, but edema in LE's; reports stiffness, but due to bedrest LLE Deficits / Details: AROM grossly WFL, but edema in LE's; reports stiffness, but due to bedrest   Cervical / Trunk Assessment Cervical / Trunk Assessment: Kyphotic   Communication Communication Communication: HOH   Cognition Arousal/Alertness: Awake/alert Behavior During Therapy: WFL for tasks assessed/performed Overall Cognitive Status: Within Functional Limits for tasks assessed                                 General Comments: initially sleepy, easily arousable   General Comments  son in room, reports between himself, his brother and their cousins there is assist in the home and pt's spouse there as well, but seems not able to help much physically - Pt and family are waiting to hear from oncology before moving forward with a more permenant plan    Exercises     Shoulder Instructions      Home Living Family/patient expects to be discharged to:: Private residence Living Arrangements: Spouse/significant other;Children Available Help at Discharge: Family;Available 24 hours/day Type of Home: House Home Access: Stairs to enter [employer] of Steps: 1   Home Layout: One level     Bathroom Shower/Tub: Engineer, site: Standard     Home Equipment: Environmental consultant - 2 wheels;Bedside commode;Grab bars - tub/shower   Additional Comments: son reports wheelchair ordered throught home health; pt sleeps in recliner      Prior Functioning/Environment Level of Independence: Needs assistance  Gait / Transfers Assistance Needed: has needed assist since fell ill before Thanksgiving, was independent prior to this illness ADL's / Homemaking Assistance Needed: assist required from aide/family            OT Problem List: Decreased strength;Decreased activity tolerance;Impaired balance (sitting and/or standing);Decreased knowledge of use of DME or AE;Increased edema      OT Treatment/Interventions: Self-care/ADL training;Therapeutic exercise;Energy conservation;DME and/or AE instruction;Therapeutic activities;Patient/family education;Balance training    OT Goals(Current goals can be found in the care plan section) Acute Rehab OT Goals Patient Stated Goal: To return home with assist OT Goal Formulation: With patient/family Time For Goal Achievement: 11/02/17 Potential to Achieve Goals: Good ADL Goals Pt Will Perform Grooming: with supervision;sitting Pt Will Perform Upper Body Bathing: with min guard assist;with caregiver independent in assisting;with adaptive equipment;sitting Pt Will Perform Lower Body Bathing: with min guard assist;with caregiver independent in assisting;with adaptive equipment;sitting/lateral leans Pt Will Transfer to Toilet: with mod assist;stand pivot transfer;bedside commode Pt Will Perform Toileting - Clothing Manipulation and hygiene: with mod assist;sit to/from stand;with caregiver independent in assisting Pt/caregiver will Perform Home Exercise Program: Increased strength;Both right and left upper extremity;With theraband;Independently;With written HEP provided Additional ADL  Goal #1: Pt will perform bed mobility at min guard assist level prior to initiating ADL activity  OT Frequency:  Min 2X/week   Barriers to D/C:            Co-evaluation              AM-PAC PT "6 Clicks" Daily Activity     Outcome Measure Help from another person eating meals?: None Help from another person taking care of personal grooming?: A Little Help from another person toileting, which includes using toliet, bedpan, or urinal?: A Lot Help from another person bathing (including washing, rinsing, drying)?: A Lot Help from another person to put on and taking off regular upper body clothing?: A Little Help from another person to put on and taking off regular lower body clothing?: A Lot 6 Click Score: 16   End of Session Nurse Communication: Mobility status  Activity Tolerance: Patient tolerated treatment well Patient left: in chair;with call bell/phone within reach;with family/visitor present  OT Visit Diagnosis: Muscle weakness (generalized) (M62.81);Unsteadiness on feet (R26.81);Other abnormalities of gait and mobility (R26.89)                Time: 6237-6283 OT Time Calculation (min): 25 min Charges:  OT General Charges $OT Visit: 1 Visit OT Evaluation $OT Eval Moderate Complexity: 1 Mod OT Treatments $Self Care/Home Management : 8-22 mins G-Codes:     Hulda Humphrey OTR/L [place] 10/19/2017, 4:33 PM

## 2017-10-20 ENCOUNTER — Inpatient Hospital Stay (HOSPITAL_COMMUNITY): Payer: Medicare Other

## 2017-10-20 DIAGNOSIS — I351 Nonrheumatic aortic (valve) insufficiency: Secondary | ICD-10-CM

## 2017-10-20 LAB — BASIC METABOLIC PANEL
ANION GAP: 15 (ref 5–15)
BUN: 83 mg/dL — AB (ref 6–20)
CHLORIDE: 96 mmol/L — AB (ref 101–111)
CO2: 19 mmol/L — AB (ref 22–32)
Calcium: 8 mg/dL — ABNORMAL LOW (ref 8.9–10.3)
Creatinine, Ser: 5.97 mg/dL — ABNORMAL HIGH (ref 0.44–1.00)
GFR calc Af Amer: 7 mL/min — ABNORMAL LOW (ref >60)
GFR calc non Af Amer: 6 mL/min — ABNORMAL LOW (ref >60)
GLUCOSE: 87 mg/dL (ref 65–99)
POTASSIUM: 3.6 mmol/L (ref 3.5–5.1)
Sodium: 130 mmol/L — ABNORMAL LOW (ref 135–145)

## 2017-10-20 LAB — ECHOCARDIOGRAM COMPLETE
HEIGHTINCHES: 63 in
Weight: 2398.6 oz

## 2017-10-20 LAB — GLUCOSE, CAPILLARY: Glucose-Capillary: 78 mg/dL (ref 65–99)

## 2017-10-20 MED ORDER — METOPROLOL TARTRATE 5 MG/5ML IV SOLN
5.0000 mg | INTRAVENOUS | Status: DC | PRN
  Filled 2017-10-20: qty 5

## 2017-10-20 MED ORDER — METOPROLOL TARTRATE 5 MG/5ML IV SOLN
5.0000 mg | Freq: Once | INTRAVENOUS | Status: DC
  Filled 2017-10-20: qty 5

## 2017-10-20 MED ORDER — METOPROLOL TARTRATE 5 MG/5ML IV SOLN
INTRAVENOUS | Status: AC
  Administered 2017-10-20: 5 mg
  Filled 2017-10-20: qty 5

## 2017-10-20 MED ORDER — FUROSEMIDE 10 MG/ML IJ SOLN: 80.0000 mg | Freq: Once | INTRAMUSCULAR | Status: DC

## 2017-10-20 MED ORDER — METOPROLOL TARTRATE 5 MG/5ML IV SOLN
5.0000 mg | Freq: Once | INTRAVENOUS | Status: AC
  Administered 2017-10-20: 5 mg via INTRAVENOUS

## 2017-10-20 MED ORDER — METOPROLOL TARTRATE 25 MG PO TABS
25.0000 mg | ORAL_TABLET | Freq: Two times a day (BID) | ORAL | Status: DC
  Administered 2017-10-21 – 2017-10-22 (×3): 25 mg via ORAL
  Filled 2017-10-20 (×3): qty 1

## 2017-10-20 MED ORDER — HEPARIN BOLUS VIA INFUSION
2000.0000 [IU] | Freq: Once | INTRAVENOUS | Status: AC
  Administered 2017-10-20: 2000 [IU] via INTRAVENOUS
  Filled 2017-10-20: qty 2000

## 2017-10-20 MED ORDER — HEPARIN (PORCINE) IN NACL 100-0.45 UNIT/ML-% IJ SOLN
1000.0000 [IU]/h | INTRAMUSCULAR | Status: DC
  Administered 2017-10-20 – 2017-10-23 (×3): 1000 [IU]/h via INTRAVENOUS
  Filled 2017-10-20 (×5): qty 250

## 2017-10-20 NOTE — Progress Notes (Signed)
Paged Jeannette Corpus about pt's BP at 89/40.  No new orders received.  Will continue to monitor.

## 2017-10-20 NOTE — Progress Notes (Signed)
Patient is now is Sinus rhythm with a HR of 76.

## 2017-10-20 NOTE — Progress Notes (Addendum)
ANTICOAGULATION CONSULT NOTE - Initial Consult  Pharmacy Consult for IV Heparin Indication: atrial fibrillation  No Known Allergies  Patient Measurements: Height: 5\' 3"  (160 cm) Weight: 149 lb 14.6 oz (68 kg) IBW/kg (Calculated) : 52.4 Heparin Dosing Weight: 66.4 kg  Vital Signs: BP: 80/52 (12/06 1744) Pulse Rate: 118 (12/06 1600)  Labs: Recent Labs    10/18/17 0301 10/19/17 0642 10/20/17 0427  HGB 9.0* 8.9*  --   HCT 25.9* 26.4*  --   PLT 212 210  --   CREATININE 8.63* 7.18* 5.97*    Estimated Creatinine Clearance: 6.6 mL/min (A) (by C-G formula based on SCr of 5.97 mg/dL (H)).   Medical History: Past Medical History:  Diagnosis Date  . Acute renal insufficiency   . CHF (congestive heart failure) ([place])   . Hypertension   . Kidney cancer, primary, with metastasis from kidney to other site [facility])   . Lymphoma ([place])   . Osteopenia   . Thyroid disease     Assessment: 83 year old female admitted 10/17/2017 with decreased urine output and worsening LE edema found to have acute obstructive uropathy and AKI and metastatic urothelial cancer with mets to liver. Today, 12/6, patient developed new atrial fibrillation with HR in 130s. Patient was given IV Lopressor and pharmacy has been consulted to start IV Heparin. Patient was not on anticoagulants prior to admission.   Hgb has been low-stable for admission, at 8.9 today. Platelets have trended down slightly (253 on 12/1, now 210). Last INR from 12/3 was 1.28. Patient has been receiving sq Heparin - last dose at 0556 AM today. SCr on admission was 8.39, now trending down at 5.97 without dialysis. No bleeding reported.   Goal of Therapy:  Heparin level 0.3-0.7 units/ml Monitor platelets by anticoagulation protocol: Yes   Plan:  Discontinue sq Heparin.  Give a low Heparin bolus of 2000 units x1 in setting of AKI. Start Heparin drip at 1000 units/hr. Heparin level in 8 hours.  Daily Heparin level and CBC while on therapy.     Sloan Leiter, PharmD, BCPS, BCCCP Clinical Pharmacist Clinical phone 10/20/2017 until 3:[phone] After hours, please call #[phone] 10/20/2017,5:59 PM

## 2017-10-20 NOTE — Progress Notes (Signed)
PROGRESS NOTE    Sharon [name]  GNF:[id number] DOB: 07-28-1932 DOA: 11/03/2017 PCP: Ann Held, DO    Brief Narrative:  83 year old female who presented with decreased urine output. Patient does have a significant past medical history for hypertension, hypothyroidism, iron deficiency anemiaandrecently diagnosed metastatic urothelial cancer,status post bilateral ureter stent placements due to obstructive uropathy(discharged 1 week prior to hospitalization).Patient was noted to have a drastic decrease in urine output, associated with worsening lower extremity edema.Her blood pressure was 142/52,heart rate 73, respiratory rate 18, temperature 98.3,oxygen saturations 95%.Moist mucous membranes, lungs clear to auscultation bilaterally, heart S1-S2 present rhythmic,abdomen soft,3+ pitting lower extremity edema.Sodium 119, potassium 6.0, chloride 84, bicarbonate 16, glucose 125, BUN95, creatinine 8.39,calcium 6.2,white cell count 13.7, hemoglobin 9.9, hematocrit 27.7, platelets253.Urinalysis with too numerous, RBCs, and too numerous to count white cells.Chest x-ray with right rotation, bilateral pleural effusions more right than left.CT of the abdomen with multiple lesions throughout the liver most suggestive of metastatic disease, extensive bony metastatic disease in the spine and pelvis, mild bilateral hydronephrosis right greater than left, large bilateral pleural effusions.   Patient was admitted to the hospital with the working diagnosis acute obstructive uropathy, complicated with uremia, metabolic acidosis,hyperkalemia and hyponatremia.  Assessment & Plan:   Principal Problem:   Uremia Active Problems:   Hypothyroidism   Essential hypertension   Iron deficiency anemia   Hyponatremia   Hyperkalemia   Metabolic acidosis   Hypocalcemia   Urothelial cancer ([place])   Acute renal failure ([place])   Palliative care by specialist   Goals of care,  counseling/discussion   1. Acute obstructive uropathy with acute kidney injury with hyponatremia, hyperkalemia and metabolic acidosis. Renal function with serum cr down to 5,97 from 7,18, serum K at 3,6 and Na at 130, serum bicarbonate up to 19 from 15, clinically very edematous, with bilateral pleural effusions, will repeat chest x ray and will order echocardiography, will consider furosemide for diuresis. Urine output over last 24 hours only 500.   2. Metastatic urothelial cancer. Patient very weak and deconditioned, will continue supportive medical therapy, will need to follow up as outpatient with Oncology. Bilateral ureteral stents obstructed, sp right nephrostomy tube. Will follow with urology as outpatient for placement of larger stents.   3. Hypertension. Blood pressure systolic 846, holding all antihypertensive medications, will consider IV furosemide for diuresis.   4. Hypothyroidism. On levothyroxine per home regimen.   5. Iron deficiency anemia. Continue iron supplements, no signs of bleeding.   DVT prophylaxis: heparin  Code Status: full Family Communication: I spoke with patient's husband at the bedside and all questions were addressed. Disposition Plan: home   Consultants:   IR  Nephrology   Procedures:   IR right percutaneous nephrostomy tube.   Antimicrobials:    Subjective: Patient still very weak and deconditioned, dyspnea on exertion and persistent lower extremity edema, no nausea or vomiting.   Objective: Vitals:   10/19/17 1503 10/19/17 2150 10/20/17 0520 10/20/17 0951  BP: (!) 123/52 (!) 120/54 (!) 121/50 120/65  Pulse: 92 92 81 87  Resp: 20 18 18    Temp: 97.7 F (36.5 C) 97.9 F (36.6 C) 97.7 F (36.5 C)   TempSrc: Oral Oral Oral   SpO2: 96% 94% 95%   Weight:      Height:        Intake/Output Summary (Last 24 hours) at 10/20/2017 1200 Last data filed at 10/20/2017 1012 Gross per 24 hour  Intake 650 ml  Output 500 ml  Net 150 ml     Filed Weights   10/17/17 0501 10/18/17 0116 10/19/17 0508  Weight: 70.5 kg (155 lb 6.8 oz) 68.9 kg (151 lb 14.4 oz) 68 kg (149 lb 14.6 oz)    Examination:   General: deconditioned Neurology: Awake and alert, non focal  E ENT: mild pallor, no icterus, oral mucosa moist Cardiovascular: No JVD. S1-S2 present, rhythmic, no gallops, rubs, or murmurs. +++ pitting lower extremity edema. Pulmonary: decreased breath sounds bilaterally at bases, adequate air movement, no wheezing, rhonchi or rales. Gastrointestinal. Abdomen flat, no organomegaly, non tender, no rebound or guarding Skin. No rashes Musculoskeletal: no joint deformities     Data Reviewed: I have personally reviewed following labs and imaging studies  CBC: Recent Labs  Lab 11/03/2017 1758 10/16/17 0445 10/17/17 0341 10/18/17 0301 10/19/17 0642  WBC 13.7* 11.7* 9.7 9.0 8.4  NEUTROABS  --   --  8.3* 7.7 6.9  HGB 9.9* 9.0* 9.3* 9.0* 8.9*  HCT 27.7* 26.3* 26.6* 25.9* 26.4*  MCV 76.7* 77.6* 75.8* 76.9* 77.4*  PLT 253 265 244 212 301   Basic Metabolic Panel: Recent Labs  Lab 10/16/17 0445 10/16/17 1003 10/17/17 0341 10/18/17 0301 10/19/17 0642 10/20/17 0427  NA 119*  --  123* 123* 127* 130*  K 5.4*  --  5.2* 4.6 4.4 3.6  CL 86*  --  88* 89* 93* 96*  CO2 16*  --  17* 14* 15* 19*  GLUCOSE 94  --  62* 85 84 87  BUN 94*  --  105* 100* 93* 83*  CREATININE 8.75*  --  9.15* 8.63* 7.18* 5.97*  CALCIUM 6.3*  --  6.4* 6.6* 7.6* 8.0*  MG  --  1.9  --   --   --   --   PHOS  --  5.6*  --   --   --   --    GFR: Estimated Creatinine Clearance: 6.6 mL/min (A) (by C-G formula based on SCr of 5.97 mg/dL (H)). Liver Function Tests: No results for input(s): AST, ALT, ALKPHOS, BILITOT, PROT, ALBUMIN in the last 168 hours. No results for input(s): LIPASE, AMYLASE in the last 168 hours. No results for input(s): AMMONIA in the last 168 hours. Coagulation Profile: Recent Labs  Lab 10/17/17 1409  INR 1.28   Cardiac  Enzymes: No results for input(s): CKTOTAL, CKMB, CKMBINDEX, TROPONINI in the last 168 hours. BNP (last 3 results) No results for input(s): PROBNP in the last 8760 hours. HbA1C: No results for input(s): HGBA1C in the last 72 hours. CBG: Recent Labs  Lab 10/17/17 0858 10/17/17 1209 10/18/17 1434 10/19/17 0739 10/20/17 0743  GLUCAP 130* 79 133* 93 78   Lipid Profile: No results for input(s): CHOL, HDL, LDLCALC, TRIG, CHOLHDL, LDLDIRECT in the last 72 hours. Thyroid Function Tests: No results for input(s): TSH, T4TOTAL, FREET4, T3FREE, THYROIDAB in the last 72 hours. Anemia Panel: No results for input(s): VITAMINB12, FOLATE, FERRITIN, TIBC, IRON, RETICCTPCT in the last 72 hours.    Radiology Studies: I have reviewed all of the imaging during this hospital visit personally     Scheduled Meds: . calcium-vitamin D  1 tablet Oral BID  . cholecalciferol  1,000 Units Oral Daily  . ferrous gluconate  325 mg Oral Q breakfast  . heparin  5,000 Units Subcutaneous Q8H  . hydrocortisone   Rectal BID  . levothyroxine  25 mcg Oral QAC breakfast  . metoprolol tartrate  12.5 mg Oral BID  . multivitamin with minerals  1  tablet Oral Daily  . nystatin  5 mL Mouth/Throat QID  . vitamin B-12  1,000 mcg Oral Daily   Continuous Infusions:   LOS: 5 days        Kynnadi Dicenso Gerome Apley, MD [facility] Pager [phone]

## 2017-10-20 NOTE — Progress Notes (Signed)
MD notified of BP of 80/52 MAP 59; HR 127; 98 pulse ox Oxygen 2 L. Lopressor was given again and heparin drip and bolus was started. Will continue to monitor patient.

## 2017-10-20 NOTE — Care Management Note (Signed)
Case Management Note  Patient Details  Name: Sharon [name] MRN: [id number] Date of Birth: [birth year]  Subjective/Objective:   Admitted with Uremia.                 Alease Frame (Spouse) Porshea Janowski [phone])    [phone]     PCP:   Roma Schanz  Action/Plan: Transition to home with home health services to follow. [place](son) to provide transportation to home.  Expected Discharge Date:  10/19/17               Expected Discharge Plan:  [place]  In-House Referral:     Discharge planning Services  CM Consult  Post Acute Care Choice:    Choice offered to:  Patient  DME Arranged:    DME Agency:     HH Arranged:  RN, PT, OT [facility] Agency:  [place], referral made with  Butch Penny @ [phone], pending  MD's orders . Orders requested per CM.  Status of Service:  Completed, signed off  If discussed at [place] of Stay Meetings, dates discussed:    Additional Comments:  Sharin Mons, RN 10/20/2017, 10:51 AM

## 2017-10-20 NOTE — Progress Notes (Signed)
Patient had 2.32 pause. Dr. Cathlean Sauer paged. Will continue to monitor patient.

## 2017-10-20 NOTE — Progress Notes (Signed)
MD notified of new finding of A Fib and heart rate in the 130's. MD assessed and I assessed no changes other than drowsiness. Patients BP was lower and the heart rate was changing rapidly. I gave lopressor IV per MD order. BP lowered to 98/53 so MD wants to D/C lasix for now. Patient was put on oxygen 2 L and SpO2 was 99%. I will continue to monitor patient. Heart is now 114.

## 2017-10-20 NOTE — Progress Notes (Signed)
PT Cancellation Note  Patient Details Name: Sharon [name] MRN: [id number] DOB: 12-Feb-1932   Cancelled Treatment:    Reason Eval/Treat Not Completed: Patient at procedure or test/unavailable(Chart reviewed, 2 attempts made to see patient, first down for xray and then subsequently getting an echo in room. Will attempt treatment again at later date/time. )  2:37 PM, 10/20/17 Etta Grandchild, PT, DPT [facility] - [place] [phone] (Pager)  [phone] [facility])  [phone] (Office)      Sharon [name] C 10/20/2017, 2:37 PM

## 2017-10-20 NOTE — Progress Notes (Signed)
Subjective:   UOP not well recorded - crt down  to 5.97.   more alert still today- husband upset thinks she is being sent home ?  Objective Vital signs in last 24 hours: Vitals:   10/19/17 1503 10/19/17 2150 10/20/17 0520 10/20/17 0951  BP: (!) 123/52 (!) 120/54 (!) 121/50 120/65  Pulse: 92 92 81 87  Resp: 20 18 18    Temp: 97.7 F (36.5 C) 97.9 F (36.6 C) 97.7 F (36.5 C)   TempSrc: Oral Oral Oral   SpO2: 96% 94% 95%   Weight:      Height:       Weight change:   Intake/Output Summary (Last 24 hours) at 10/20/2017 1058 Last data filed at 10/20/2017 1012 Gross per 24 hour  Intake 650 ml  Output 500 ml  Net 150 ml    Assessment/ Plan: Pt is a 83 y.o. yo female who was admitted on 11/10/2017 with weakness- recent dx of metastatic urothelial CA- s/p bilat ureteral stents but now with cont hydro and AKI  Assessment/Plan: 1. Acute renal failure - recurrent issue w/ recent bilat ureteral stent placement in [place], [place] 3 wks ago.  Repeat  CT showed an atrophic L kidney , may not be functional. Right kidney obstructed- now s/p nephrost tube with great UOP and crt down. No absolute indication for acute dialysis and would hesitate to use in this setting as do not know prognosis- Anticipate cont improvement in renal function but she is tenuous so would be hesitant to send home too soon    2. HTN/volume- if anything overloaded- BP is fine - low dose lopressor- stopped IVF 3. Anemia- dropping some- supportive care 4. Hypocalcemia- now on calc and vit D- improved 5. Hyponatremia- likely due to volume overload- is improved again from yest     [place]: Basic Metabolic Panel: Recent Labs  Lab 10/16/17 1003  10/18/17 0301 10/19/17 0642 10/20/17 0427  NA  --    < > 123* 127* 130*  K  --    < > 4.6 4.4 3.6  CL  --    < > 89* 93* 96*  CO2  --    < > 14* 15* 19*  GLUCOSE  --    < > 85 84 87  BUN  --    < > 100* 93* 83*  CREATININE  --    < > 8.63* 7.18* 5.97*   CALCIUM  --    < > 6.6* 7.6* 8.0*  PHOS 5.6*  --   --   --   --    < > = values in this interval not displayed.   Liver Function Tests: No results for input(s): AST, ALT, ALKPHOS, BILITOT, PROT, ALBUMIN in the last 168 hours. No results for input(s): LIPASE, AMYLASE in the last 168 hours. No results for input(s): AMMONIA in the last 168 hours. CBC: Recent Labs  Lab 11/08/2017 1758 10/16/17 0445 10/17/17 0341 10/18/17 0301 10/19/17 0642  WBC 13.7* 11.7* 9.7 9.0 8.4  NEUTROABS  --   --  8.3* 7.7 6.9  HGB 9.9* 9.0* 9.3* 9.0* 8.9*  HCT 27.7* 26.3* 26.6* 25.9* 26.4*  MCV 76.7* 77.6* 75.8* 76.9* 77.4*  PLT 253 265 244 212 210   Cardiac Enzymes: No results for input(s): CKTOTAL, CKMB, CKMBINDEX, TROPONINI in the last 168 hours. CBG: Recent Labs  Lab 10/17/17 0858 10/17/17 1209 10/18/17 1434 10/19/17 0739 10/20/17 0743  GLUCAP 130* 79 133* 93 78  Iron Studies: No results for input(s): IRON, TIBC, TRANSFERRIN, FERRITIN in the last 72 hours. Studies/Results: No results found. Medications: Infusions:   Scheduled Medications: . calcium-vitamin D  1 tablet Oral BID  . cholecalciferol  1,000 Units Oral Daily  . ferrous gluconate  325 mg Oral Q breakfast  . heparin  5,000 Units Subcutaneous Q8H  . hydrocortisone   Rectal BID  . levothyroxine  25 mcg Oral QAC breakfast  . metoprolol tartrate  12.5 mg Oral BID  . multivitamin with minerals  1 tablet Oral Daily  . nystatin  5 mL Mouth/Throat QID  . vitamin B-12  1,000 mcg Oral Daily    have reviewed scheduled and prn medications.  Physical Exam: General: sitting up eating "I hope so "  Heart: RRR Lungs: mostly clear Abdomen: soft, non tender- clear urine from nephrostomy tube-  Extremities: pitting edema    10/20/2017,10:58 AM  LOS: 5 days

## 2017-10-20 NOTE — Progress Notes (Signed)
Visit patient as response to AD consult.  Per patient and son patient declined AD at this time.  Patient alert and just completed talking with her doctor.  Chaplain available as needed.   10/20/17 1150  Clinical Encounter Type  Visited With Patient and family together;Health care provider  Visit Type Initial;Spiritual support  Referral From Nurse  Spiritual Encounters  Spiritual Needs Emotional  Stress Factors  Patient Stress Factors None identified  Family Stress Factors None identified  Advance Directives (For Healthcare)  Does Patient Have a Medical Advance Directive? No  Would patient like information on creating a medical advance directive? No - Patient declined  Cristopher Peru, [facility], Pager [phone]

## 2017-10-20 NOTE — Progress Notes (Signed)
Patient ID: Sharon [name], female   DOB: 01-17-1932, 83 y.o.   MRN: [id number]    Referring Physician(s): Dr. Irine Seal  Supervising Physician: Corrie Mckusick  Patient Status: [facility] - In-pt  Chief Complaint: Hydronephrosis, right  Subjective: Patient appears confused, sleepy today.  Moans.  Getting ECHO.   Allergies: Patient has no known allergies.  Medications: Prior to Admission medications   Medication Sig Start Date End Date Taking? Authorizing Provider  Calcium Carbonate-Vitamin D (CALCIUM + D) 600-200 MG-UNIT TABS Take 2 tablets by mouth daily.     Yes [provider]  cholecalciferol (VITAMIN D) 1000 UNITS tablet Take 1,000 Units by mouth daily.     Yes [provider]  cyclobenzaprine (FLEXERIL) 5 MG tablet Take 5 mg by mouth 3 (three) times daily as needed for muscle spasms.   Yes [provider]  ferrous gluconate (FERGON) 325 MG tablet Take 325 mg by mouth daily with breakfast.     Yes [provider]  Flaxseed, Linseed, (FLAX SEED OIL) 1000 MG CAPS Take 1 capsule by mouth daily.     Yes [provider]  furosemide (LASIX) 40 MG tablet Take 40 mg by mouth daily.   Yes [provider]  levothyroxine (SYNTHROID, LEVOTHROID) 25 MCG tablet 1 po qd 09/25/12  Yes Roma Schanz R, DO  metoprolol tartrate (LOPRESSOR) 25 MG tablet Take 25 mg by mouth 2 (two) times daily.   Yes [provider]  Multiple Vitamin (MULTIVITAMIN) tablet Take 1 tablet by mouth daily.     Yes [provider]  vitamin B-12 (CYANOCOBALAMIN) 1000 MCG tablet Take 1,000 mcg by mouth daily.     Yes [provider]  zoster vaccine live, PF, (ZOSTAVAX) 80998 UNT/0.65ML injection Inject 19,400 Units into the skin once. 09/25/12  Yes Roma Schanz R, DO  alendronate (FOSAMAX) 70 MG tablet Take with a full glass of water on an empty stomach. Patient not taking: Reported on 10/28/2017 09/25/12   Carollee Herter, Alferd Apa, DO    ciprofloxacin (CIPRO) 250 MG tablet Take 1 tablet (250 mg total) by mouth 2 (two) times daily. Patient not taking: Reported on 10/24/2017 09/27/12   Carollee Herter, Alferd Apa, DO  lisinopril-hydrochlorothiazide (PRINZIDE,ZESTORETIC) 10-12.5 MG per tablet 1 tab by mouth daily--Office visit due now Patient not taking: Reported on 10/17/2017 09/21/13   Carollee Herter, Alferd Apa, DO    Vital Signs: BP 103/62   Pulse 87   Temp 97.7 F (36.5 C) (Oral)   Resp 18   Ht 5\' 3"  (1.6 m)   Wt 149 lb 14.6 oz (68 kg)   SpO2 95%   BMI 26.56 kg/m   Physical Exam: Abd: R PCN in place, site c/d/i.  Clear yellow output in collection bag.   Imaging: Dg Chest 2 View  Result Date: 10/20/2017 CLINICAL DATA:  Shortness of Breath EXAM: CHEST  2 VIEW COMPARISON:  October 16, 2017 FINDINGS: Pleural effusions are again noted bilaterally, slightly larger on the right than on the left. There is associated bibasilar atelectasis. There is no frank airspace consolidation appreciable. Heart is upper normal in size with pulmonary vascularity within normal limits. No adenopathy. No evident bone lesions. IMPRESSION: Persistent pleural effusions, slightly larger on the right than on the left, with bibasilar atelectasis. Stable cardiac silhouette. Electronically Signed   By: Lowella Grip III M.D.   On: 10/20/2017 14:10   US Renal  Result Date: 10/17/2017 CLINICAL DATA:  Bilateral hydronephrosis EXAM: RENAL / URINARY  TRACT ULTRASOUND COMPLETE COMPARISON:  Abdominal CT from yesterday FINDINGS: Right Kidney: Length: 13 cm. Echogenic with moderate hydronephrosis. The known ureteral stent is difficult to visualize. No mass lesion. Left Kidney: Length: 9 cm, which is overestimated relative to abdominal CT from yesterday (8cm). Echogenic cortex with diffuse cortical thinning. Mild hydronephrosis. Known ureteral stent is difficult to visualize. Bladder: Decompressed. Trace ascites.  Known pleural effusions. IMPRESSION: 1. Moderate right and  mild left hydronephrosis that is stable from CT yesterday. 2. Decompressed urinary bladder. 3. Chronic medical renal disease with asymmetric left renal atrophy. 4. Trace ascites. Electronically Signed   By: Monte Fantasia M.D.   On: 10/17/2017 08:35   Dg Chest Port 1 View  Result Date: 10/16/2017 CLINICAL DATA:  Acute kidney injury. History of metastatic cancer and ureteral stents. EXAM: PORTABLE CHEST 1 VIEW COMPARISON:  Chest radiograph Mar 17, 2015 FINDINGS: Cardiac silhouette is mildly enlarged. Mediastinal silhouette is nonsuspicious. Moderate RIGHT and small LEFT pleural effusions with underlying consolidation. No pneumothorax. Soft tissue planes included osseous structures are nonsuspicious. IMPRESSION: Moderate RIGHT and small LEFT pleural effusions with underlying consolidation. Recommend follow-up chest radiograph after treatment to exclude underlying mass. Mild cardiomegaly. Electronically Signed   By: Elon Alas M.D.   On: 10/16/2017 20:02   Ir Nephrostomy Placement Right  Result Date: 10/17/2017 INDICATION: 83 year old female with a history of urothelial cancer. She has bilateral ureteral stents which were placed at an outside institution. Unfortunately, she has recurrent right-sided hydronephrosis and worsening renal function consistent with obstructed uropathy. Placement of a percutaneous nephrostomy tube is warranted for renal decompression. EXAM: IR NEPHROSTOMY PLACEMENT RIGHT COMPARISON:  None. MEDICATIONS: Ciprofloxacin 400 mg IV; The antibiotic was administered in an appropriate time frame prior to skin puncture. ANESTHESIA/SEDATION: Fentanyl 75 mcg IV; Versed 1.5 mg IV Moderate Sedation Time:  15 minutes The patient was continuously monitored during the procedure by the interventional radiology nurse under my direct supervision. CONTRAST:  15 mL Isovue-300 - administered into the collecting system(s) FLUOROSCOPY TIME:  Fluoroscopy Time: 1 minutes 54 seconds (5 mGy). COMPLICATIONS:  None immediate. PROCEDURE: Informed written consent was obtained from the patient after a thorough discussion of the procedural risks, benefits and alternatives. All questions were addressed. Maximal Sterile Barrier Technique was utilized including caps, mask, sterile gowns, sterile gloves, sterile drape, hand hygiene and skin antiseptic. A timeout was performed prior to the initiation of the procedure. The right flank was interrogated with ultrasound. The right kidney is identified and is found to be hydronephrotic. A suitable calyx was selected. Local anesthesia was attained by infiltration with 1% lidocaine. A small dermatotomy was made. Under real-time sonographic guidance, a posterior interpolar calyx was punctured with a 21 gauge Accustick needle. There was return of clear urine. A hand injection of contrast material through the needle opacifies the renal collecting system. A 0.018 inch wire was advanced into the renal collecting system. The needle was removed. The Accustick sheath was advanced of the renal collecting system. The 0.018 inch wire was then exchanged for a 0.035 inch wire. The skin tract was dilated to 10 [place]. A Cook 10.2 [place] all-purpose drainage catheter was advanced over the wire and formed in the renal pelvis. A gentle hand injection of contrast material confirms placement within the renal pelvis. A fluoroscopic spot image was saved. The catheter was connected to gravity bag drainage and secured to the skin with 0 Prolene suture. The patient tolerated the procedure well. IMPRESSION: Successful placement of a 10 French right-sided percutaneous nephrostomy  tube. Patient should return Interventional Radiology in 6- 8 weeks for nephrostomy tube check and exchange. Signed, Criselda Peaches, MD [facility] Electronically Signed   By: Jacqulynn Cadet M.D.   On: 10/17/2017 16:52    Labs:  CBC: Recent Labs    10/16/17 0445  10/17/17 0341 10/18/17 0301 10/19/17 0642  WBC 11.7* 9.7 9.0 8.4  HGB 9.0* 9.3* 9.0* 8.9*  HCT 26.3* 26.6* 25.9* 26.4*  PLT 265 244 212 210    COAGS: Recent Labs    10/17/17 1409  INR 1.28  APTT 43*    BMP: Recent Labs    10/17/17 0341 10/18/17 0301 10/19/17 0642 10/20/17 0427  NA 123* 123* 127* 130*  K 5.2* 4.6 4.4 3.6  CL 88* 89* 93* 96*  CO2 17* 14* 15* 19*  GLUCOSE 62* 85 84 87  BUN 105* 100* 93* 83*  CALCIUM 6.4* 6.6* 7.6* 8.0*  CREATININE 9.15* 8.63* 7.18* 5.97*  GFRNONAA 3* 4* 5* 6*  GFRAA 4* 4* 5* 7*    LIVER FUNCTION TESTS: No results for input(s): BILITOT, AST, ALT, ALKPHOS, PROT, ALBUMIN in the last 8760 hours.  Assessment and Plan: Right hydronephrosis, s/p PCN placement 10/17/17 Drain remains in good position with continued output.  Plans are per Urology.  IR to follow.   Electronically Signed: Docia Barrier 10/20/2017, 3:15 PM   I spent a total of 15 Minutes at the the patient's bedside AND on the patient's hospital floor or unit, greater than 50% of which was counseling/coordinating care for right hydronephrosis secondary to obstructed stents.

## 2017-10-20 NOTE — Progress Notes (Signed)
  Echocardiogram 2D Echocardiogram has been performed.  Sharon [name] T Lyall Faciane 10/20/2017, 3:00 PM

## 2017-10-20 NOTE — Progress Notes (Signed)
Assessment and Plan: AKI with bilateral hydro with bilateral stents that are obstructed.  Right NT continues to drain well. Her Cr down to 5.97.   When she has recovered sufficiently from the acute episode, we could consider replacing her current stents with larger caliber ones.  I will see prn.   Please let me know when we might be able to consider stent exchange.    Subjective: Mrs. Weatherholtz continues to improve with a further decline in the Cr.  ROS:  ROS  Anti-infectives: Anti-infectives (From admission, onward)   Start     Dose/Rate Route Frequency Ordered Stop   10/17/17 1600  ciprofloxacin (CIPRO) IVPB 400 mg     400 mg 200 mL/hr over 60 Minutes Intravenous To Radiology 10/17/17 1507 10/17/17 1840   10/17/17 1525  ciprofloxacin (CIPRO) 400 MG/200ML IVPB    Comments:  [name]   : cabinet override      10/17/17 1525 10/18/17 0344      Current Facility-Administered Medications  Medication Dose Route Frequency Provider Last Rate Last Dose  . acetaminophen (TYLENOL) tablet 650 mg  650 mg Oral Q6H PRN Ivor Costa, MD      . calcium-vitamin D (OSCAL WITH D) 500-200 MG-UNIT per tablet 1 tablet  1 tablet Oral BID Ivor Costa, MD   1 tablet at 10/19/17 2238  . cholecalciferol (VITAMIN D) tablet 1,000 Units  1,000 Units Oral Daily Ivor Costa, MD   1,000 Units at 10/19/17 [phone]  . cyclobenzaprine (FLEXERIL) tablet 5 mg  5 mg Oral TID PRN Ivor Costa, MD      . ferrous gluconate [facility]) tablet 325 mg  325 mg Oral Q breakfast Ivor Costa, MD   324 mg at 10/19/17 1452  . heparin injection 5,000 Units  5,000 Units Subcutaneous Q8H Monia Sabal, PA-C   5,000 Units at 10/20/17 0556  . hydrocortisone (ANUSOL-HC) 2.5 % rectal cream   Rectal BID Ivor Costa, MD   1 application at 18/56/31 2237  . levothyroxine (SYNTHROID, LEVOTHROID) tablet 25 mcg  25 mcg Oral QAC breakfast Ivor Costa, MD   25 mcg at 10/19/17 [phone]  . metoprolol tartrate (LOPRESSOR) tablet 12.5 mg  12.5 mg Oral BID Roney Jaffe, MD   12.5 mg at 10/19/17 2237  . multivitamin with minerals tablet 1 tablet  1 tablet Oral Daily Ivor Costa, MD   1 tablet at 10/19/17 [phone]  . nystatin (MYCOSTATIN) 100000 UNIT/ML suspension 500,000 Units  5 mL Mouth/Throat QID Vianne Bulls, MD   500,000 Units at 10/19/17 2237  . ondansetron (ZOFRAN) injection 4 mg  4 mg Intravenous Q8H PRN Ivor Costa, MD      . polyethylene glycol (MIRALAX / GLYCOLAX) packet 17 g  17 g Oral Daily PRN Ivor Costa, MD      . vitamin B-12 (CYANOCOBALAMIN) tablet 1,000 mcg  1,000 mcg Oral Daily Ivor Costa, MD   1,000 mcg at 10/19/17 0943  . zolpidem (AMBIEN) tablet 5 mg  5 mg Oral QHS PRN Ivor Costa, MD         Objective: Vital signs in last 24 hours: Temp:  [97.7 F (36.5 C)-97.9 F (36.6 C)] 97.7 F (36.5 C) (12/06 0520) Pulse Rate:  [81-92] 81 (12/06 0520) Resp:  [18-20] 18 (12/06 0520) BP: (104-123)/(47-54) 121/50 (12/06 0520) SpO2:  [94 %-96 %] 95 % (12/06 0520)  Intake/Output from previous day: 12/05 0701 - 12/06 0700 In: 100 [P.O.:100] Out: 500 [Urine:500] Intake/Output this shift: No intake/output data recorded.  Physical Exam  Constitutional: She is well-developed, well-nourished, and in no distress.  Vitals reviewed.   Lab Results:  Recent Labs    10/18/17 0301 10/19/17 0642  WBC 9.0 8.4  HGB 9.0* 8.9*  HCT 25.9* 26.4*  PLT 212 210   BMET Recent Labs    10/19/17 0642 10/20/17 0427  NA 127* 130*  K 4.4 3.6  CL 93* 96*  CO2 15* 19*  GLUCOSE 84 87  BUN 93* 83*  CREATININE 7.18* 5.97*  CALCIUM 7.6* 8.0*   PT/INR Recent Labs    10/17/17 1409  LABPROT 15.9*  INR 1.28   ABG No results for input(s): PHART, HCO3 in the last 72 hours.  Invalid input(s): PCO2, PO2  Studies/Results: No results found.         LOS: 5 days    Irine Seal 10/20/2017 [phone] ID: Wray Kearns, female   DOB: 1932-10-14, 83 y.o.   MRN: [id number]

## 2017-10-21 DIAGNOSIS — I4891 Unspecified atrial fibrillation: Secondary | ICD-10-CM

## 2017-10-21 LAB — CBC
HCT: 26.6 % — ABNORMAL LOW (ref 36.0–46.0)
Hemoglobin: 9 g/dL — ABNORMAL LOW (ref 12.0–15.0)
MCH: 26.5 pg (ref 26.0–34.0)
MCHC: 33.8 g/dL (ref 30.0–36.0)
MCV: 78.2 fL (ref 78.0–100.0)
PLATELETS: 173 10*3/uL (ref 150–400)
RBC: 3.4 MIL/uL — AB (ref 3.87–5.11)
RDW: 16.3 % — ABNORMAL HIGH (ref 11.5–15.5)
WBC: 9.7 10*3/uL (ref 4.0–10.5)

## 2017-10-21 LAB — RENAL FUNCTION PANEL
ALBUMIN: 1.8 g/dL — AB (ref 3.5–5.0)
Anion gap: 16 — ABNORMAL HIGH (ref 5–15)
BUN: 83 mg/dL — ABNORMAL HIGH (ref 6–20)
CALCIUM: 8 mg/dL — AB (ref 8.9–10.3)
CO2: 17 mmol/L — ABNORMAL LOW (ref 22–32)
CREATININE: 5.04 mg/dL — AB (ref 0.44–1.00)
Chloride: 95 mmol/L — ABNORMAL LOW (ref 101–111)
GFR, EST AFRICAN AMERICAN: 8 mL/min — AB (ref >60)
GFR, EST NON AFRICAN AMERICAN: 7 mL/min — AB (ref >60)
Glucose, Bld: 81 mg/dL (ref 65–99)
Phosphorus: 4.9 mg/dL — ABNORMAL HIGH (ref 2.5–4.6)
Potassium: 3.7 mmol/L (ref 3.5–5.1)
SODIUM: 128 mmol/L — AB (ref 135–145)

## 2017-10-21 LAB — GLUCOSE, CAPILLARY: GLUCOSE-CAPILLARY: 71 mg/dL (ref 65–99)

## 2017-10-21 LAB — HEPARIN LEVEL (UNFRACTIONATED)
HEPARIN UNFRACTIONATED: 0.48 [IU]/mL (ref 0.30–0.70)
Heparin Unfractionated: 0.53 IU/mL (ref 0.30–0.70)

## 2017-10-21 MED ORDER — DIGOXIN 0.25 MG/ML IJ SOLN
0.2500 mg | Freq: Once | INTRAMUSCULAR | Status: AC
  Administered 2017-10-21: 0.25 mg via INTRAVENOUS
  Filled 2017-10-21: qty 2

## 2017-10-21 NOTE — Progress Notes (Signed)
[place] for IV Heparin Indication: atrial fibrillation  No Known Allergies  Patient Measurements: Height: 5\' 3"  (160 cm) Weight: 149 lb 14.6 oz (68 kg) IBW/kg (Calculated) : 52.4 Heparin Dosing Weight: 66.4 kg  Vital Signs: Temp: 98.3 F (36.8 C) (12/06 2059) BP: 89/40 (12/06 2102) Pulse Rate: 78 (12/06 2102)  Labs: Recent Labs    10/19/17 0642 10/20/17 0427 10/21/17 0201  HGB 8.9*  --  9.0*  HCT 26.4*  --  26.6*  PLT 210  --  173  HEPARINUNFRC  --   --  0.48  CREATININE 7.18* 5.97* 5.04*    Estimated Creatinine Clearance: 7.8 mL/min (A) (by C-G formula based on SCr of 5.04 mg/dL (H)).   Medical History: Past Medical History:  Diagnosis Date  . Acute renal insufficiency   . CHF (congestive heart failure) ([place])   . Hypertension   . Kidney cancer, primary, with metastasis from kidney to other site [facility])   . Lymphoma ([place])   . Osteopenia   . Thyroid disease     Assessment: 83 year old female admitted 11/05/2017 with decreased urine output and worsening LE edema found to have acute obstructive uropathy and AKI and metastatic urothelial cancer with mets to liver. Today, 12/6, patient developed new atrial fibrillation with HR in 130s. Patient was given IV Lopressor and pharmacy has been consulted to start IV Heparin. Patient was not on anticoagulants prior to admission.   Hgb has been low-stable for admission, at 8.9 today. Platelets have trended down slightly (253 on 12/1, now 210). Last INR from 12/3 was 1.28. Patient has been receiving sq Heparin - last dose at 0556 AM today. SCr on admission was 8.39, now trending down at 5.97 without dialysis. No bleeding reported.   12/7 AM: heparin level therapeutic x 1   Goal of Therapy:  Heparin level 0.3-0.7 units/ml Monitor platelets by anticoagulation protocol: Yes   Plan:  Cont heparin at 1000 units/hr 1200 HL  Narda Bonds, PharmD, BCPS Clinical Pharmacist Phone:  [phone]

## 2017-10-21 NOTE — Progress Notes (Signed)
ANTICOAGULATION CONSULT NOTE - Follow Up Consult  Pharmacy Consult for Heparin Indication: atrial fibrillation  No Known Allergies  Patient Measurements: Height: 5\' 3"  (160 cm) Weight: 147 lb 14.9 oz (67.1 kg) IBW/kg (Calculated) : 52.4 Heparin Dosing Weight: 67 kg  Vital Signs: Temp: 98.3 F (36.8 C) (12/07 0822) Temp Source: Oral (12/07 0822) BP: 102/56 (12/07 0822) Pulse Rate: 93 (12/07 0822)  Labs: Recent Labs    10/19/17 0642 10/20/17 0427 10/21/17 0201 10/21/17 1129  HGB 8.9*  --  9.0*  --   HCT 26.4*  --  26.6*  --   PLT 210  --  173  --   HEPARINUNFRC  --   --  0.48 0.53  CREATININE 7.18* 5.97* 5.04*  --     Estimated Creatinine Clearance: 7.8 mL/min (A) (by C-G formula based on SCr of 5.04 mg/dL (H)).  Assessment:  83 yr old female started on IV heparin 12/6 pm for atrial fibrillation.     Heparin level remains therapeutic (0.53) on 1000 units/hr. Hgb low stable, platelet count trended down some.  No bleeding reported.  Goal of Therapy:  Heparin level 0.3-0.7 units/ml Monitor platelets by anticoagulation protocol: Yes   Plan:   Continue Heparin drip at 1000 units/hr  Daily heparin level and CBC while on Heparin.  Follow up anticoagulation plans.  Arty Baumgartner, [place] Pager: [phone] 10/21/2017,2:04 PM

## 2017-10-21 NOTE — Progress Notes (Signed)
PROGRESS NOTE    Sharon [name]  VQQ:[id number] DOB: July 10, 1932 DOA: 11/13/2017 PCP: Ann Held, DO    Brief Narrative:  83 year old female who presented with decreased urine output. Patient does haveasignificant pastmedicalhistory for hypertension, hypothyroidism, iron deficiency anemiaandrecently diagnosed metastatic urothelial cancer,status post bilateral ureter stent placements due to obstructive uropathy(discharged 1 week prior to hospitalization).Patient was noted to have a drastic decrease in urine output, associated with worsening lower extremity edema.Her blood pressure was 142/52,heart rate 73, respiratory rate 18, temperature 98.3,oxygen saturations 95%.Moist mucous membranes, lungs clear to auscultation bilaterally, heart S1-S2 present rhythmic,abdomen soft,3+ pitting lowerextremityedema.Sodium 119, potassium 6.0, chloride 84, bicarbonate 16, glucose 125, BUN95, creatinine 8.39,calcium 6.2,white cell count 13.7, hemoglobin 9.9, hematocrit 27.7, platelets253.Urinalysis with too numerous, RBCs, and too numerous to count white cells.Chest x-ray with right rotation, bilateral pleural effusions more rightthanleft.CT of the abdomen with multiple lesions throughout the liver most suggestive of metastatic disease, extensive bony metastatic disease in the spine and pelvis, mild bilateral hydronephrosis right greater than left, large bilateral pleural effusions.  Patient was admitted tothehospital with the working diagnosis acute obstructive uropathy, complicated with uremia, metabolic acidosis,hyperkalemia and hyponatremia.  Assessment & Plan:   Principal Problem:   Uremia Active Problems:   Hypothyroidism   Essential hypertension   Iron deficiency anemia   Hyponatremia   Hyperkalemia   Metabolic acidosis   Hypocalcemia   Urothelial cancer ([place])   Acute renal failure ([place])   Palliative care by specialist   Goals of care,  counseling/discussion   1.Acute obstructive uropathy with acute kidney injurywith hyponatremia, hyperkalemia and metabolic acidosis. Renal function with serum cr down to 5,04 from 5,97, urine output at 475 over last 24 hours, still edematous. Serum K at 3,7 with serum bicarbonate at 17.   2. New onset atrial fibrillation with rapid ventricular response. Will continue as needed IV metoprolol, and scheduled po metoprolol, will avoid further digoxine due to significant decreased GFR. CHADsVASc score 4, with high thromboembolic risk, HAS bled score 3 with high risk for bleeding complications, for now will continue anticoagulation with heparin. Follow up on echcardiography.    2.Metastatic urothelial cancer bilateral ureteral stents obstructed, sp right nephrostomy tube. Outpatient follow up with Oncology and Urology. Will need larger stents.  3.Hypertension.Hypotension, likely related to uncontrolled atrial fibrillation, will continue close monitoring. Will hold on furosemide for now.   4.Hypothyroidism. Contiune levothyroxine.  5.Iron deficiency anemia. Hb at 8 and hct at 26,6, will continue close monitoring of cell count. No indication for transfusion.   DVT prophylaxis:heparin Code Status:full Family Communication:I spoke with patient's husband at the bedside and all questions were addressed. Disposition Plan:home   Consultants:  IR  Nephrology  Procedures:  IR right percutaneous nephrostomy tube.  Antimicrobials:    Subjective: Patient feeling very weak and deconditioned, no chest pain or palpitations, dyspnea has improved.   Objective: Vitals:   10/21/17 0421 10/21/17 0509 10/21/17 0530 10/21/17 0822  BP: (!) 83/52  (!) 107/53 (!) 102/56  Pulse: (!) 57  64 93  Resp: 16  16 16   Temp: 98.3 F (36.8 C)  97.9 F (36.6 C) 98.3 F (36.8 C)  TempSrc: Oral  Oral Oral  SpO2: 94%  98%   Weight:  67.1 kg (147 lb 14.9 oz)    Height:         Intake/Output Summary (Last 24 hours) at 10/21/2017 1052 Last data filed at 10/21/2017 1048 Gross per 24 hour  Intake 200.67 ml  Output 325 ml  Net -124.33 ml   Filed Weights   10/18/17 0116 10/19/17 0508 10/21/17 0509  Weight: 68.9 kg (151 lb 14.4 oz) 68 kg (149 lb 14.6 oz) 67.1 kg (147 lb 14.9 oz)    Examination:   General: deconditioned Neurology: Awake and alert, non focal  E ENT: no pallor, no icterus, oral mucosa moist Cardiovascular: No JVD. S1-S2 present, rhythmic, no gallops, rubs, or murmurs. +++ pitting lower extremity edema. Pulmonary: decreased breath sounds bilaterally at bases, adequate air movement, no wheezing, rhonchi or rales. Gastrointestinal. Abdomen flat, no organomegaly, non tender, no rebound or guarding Skin. No rashes Musculoskeletal: no joint deformities     Data Reviewed: I have personally reviewed following labs and imaging studies  CBC: Recent Labs  Lab 10/16/17 0445 10/17/17 0341 10/18/17 0301 10/19/17 0642 10/21/17 0201  WBC 11.7* 9.7 9.0 8.4 9.7  NEUTROABS  --  8.3* 7.7 6.9  --   HGB 9.0* 9.3* 9.0* 8.9* 9.0*  HCT 26.3* 26.6* 25.9* 26.4* 26.6*  MCV 77.6* 75.8* 76.9* 77.4* 78.2  PLT 265 244 212 210 235   Basic Metabolic Panel: Recent Labs  Lab 10/16/17 1003 10/17/17 0341 10/18/17 0301 10/19/17 0642 10/20/17 0427 10/21/17 0201  NA  --  123* 123* 127* 130* 128*  K  --  5.2* 4.6 4.4 3.6 3.7  CL  --  88* 89* 93* 96* 95*  CO2  --  17* 14* 15* 19* 17*  GLUCOSE  --  62* 85 84 87 81  BUN  --  105* 100* 93* 83* 83*  CREATININE  --  9.15* 8.63* 7.18* 5.97* 5.04*  CALCIUM  --  6.4* 6.6* 7.6* 8.0* 8.0*  MG 1.9  --   --   --   --   --   PHOS 5.6*  --   --   --   --  4.9*   GFR: Estimated Creatinine Clearance: 7.8 mL/min (A) (by C-G formula based on SCr of 5.04 mg/dL (H)). Liver Function Tests: Recent Labs  Lab 10/21/17 0201  ALBUMIN 1.8*   No results for input(s): LIPASE, AMYLASE in the last 168 hours. No results for  input(s): AMMONIA in the last 168 hours. Coagulation Profile: Recent Labs  Lab 10/17/17 1409  INR 1.28   Cardiac Enzymes: No results for input(s): CKTOTAL, CKMB, CKMBINDEX, TROPONINI in the last 168 hours. BNP (last 3 results) No results for input(s): PROBNP in the last 8760 hours. HbA1C: No results for input(s): HGBA1C in the last 72 hours. CBG: Recent Labs  Lab 10/17/17 1209 10/18/17 1434 10/19/17 0739 10/20/17 0743 10/21/17 0749  GLUCAP 79 133* 93 78 71   Lipid Profile: No results for input(s): CHOL, HDL, LDLCALC, TRIG, CHOLHDL, LDLDIRECT in the last 72 hours. Thyroid Function Tests: No results for input(s): TSH, T4TOTAL, FREET4, T3FREE, THYROIDAB in the last 72 hours. Anemia Panel: No results for input(s): VITAMINB12, FOLATE, FERRITIN, TIBC, IRON, RETICCTPCT in the last 72 hours.    Radiology Studies: I have reviewed all of the imaging during this hospital visit personally     Scheduled Meds: . calcium-vitamin D  1 tablet Oral BID  . cholecalciferol  1,000 Units Oral Daily  . ferrous gluconate  325 mg Oral Q breakfast  . hydrocortisone   Rectal BID  . levothyroxine  25 mcg Oral QAC breakfast  . metoprolol tartrate  5 mg Intravenous Once  . metoprolol tartrate  25 mg Oral BID  . multivitamin with minerals  1 tablet Oral Daily  . nystatin  5 mL Mouth/Throat QID  . vitamin B-12  1,000 mcg Oral Daily   Continuous Infusions: . heparin 1,000 Units/hr (10/20/17 1910)     LOS: 6 days        Alvy Alsop Gerome Apley, MD [facility] Pager [phone]

## 2017-10-21 NOTE — Significant Event (Signed)
Rapid Response Event Note  Overview: Called by bedside RN d/t HR-150s afib and BP-83/52 Time Called: 7591 Arrival Time: 0429 Event Type: Cardiac, Hypotension  Initial Focused Assessment: Pt sitting up in bed, alert and oriented, c/o no pain. HR-152(afib), BP-83/52, RR-16, SpO2-94% on 2L [place].  Interventions: Digoxin 0.25mg  given per NP Blount's order  Plan of Care (if not transferred): Monitor HR and BP.  Handoff given to Magda Paganini, RRT RN for followup on patient. Call RRT if further assistance needed. Event Summary: Name of Physician Notified: Kennon Holter, NP at [phone]    at    Outcome: Stayed in room and stabalized     [place], Carren Rang

## 2017-10-21 NOTE — Progress Notes (Signed)
Subjective:   UOP not well recorded - crt down  to 81.04.  Went into Afib with RVR and had hypotension- given dig, started on heparin- seems better this AM  Objective Vital signs in last 24 hours: Vitals:   10/21/17 0421 10/21/17 0509 10/21/17 0530 10/21/17 0822  BP: (!) 83/52  (!) 107/53 (!) 102/56  Pulse: (!) 57  64 93  Resp: 16  16 16   Temp: 98.3 F (36.8 C)  97.9 F (36.6 C) 98.3 F (36.8 C)  TempSrc: Oral  Oral Oral  SpO2: 94%  98%   Weight:  67.1 kg (147 lb 14.9 oz)    Height:       Weight change:   Intake/Output Summary (Last 24 hours) at 10/21/2017 1103 Last data filed at 10/21/2017 1048 Gross per 24 hour  Intake 200.67 ml  Output 325 ml  Net -124.33 ml    Assessment/ Plan: Pt is Sharon [name] 83 y.o. yo female who was admitted on 11/10/2017 with weakness- recent dx of metastatic urothelial CA- s/p bilat ureteral stents but now with cont hydro and AKI  Assessment/Plan: 1. Acute renal failure - recurrent issue w/ recent bilat ureteral stent placement in [place], [place] 3 wks PTA.  Repeat  CT showed an atrophic L kidney , may not be functional. Right kidney obstructed- now s/p nephrost tube with initially great UOP now not well recorded but crt down. No absolute indication for acute dialysis and would hesitate to use in this setting as do not know prognosis- Anticipate cont improvement in renal function but she is tenuous  2. HTN/volume- if anything overloaded- BP is fine - low dose lopressor- - given more IV with Afib last 24 hours- stopped IVF 3. Anemia- dropping some- supportive care 4. Hypocalcemia- now on calc and vit D- improved 5. Hyponatremia- likely due to volume overload-went in wrong direction today- not sure why- no change for now   Sharon Sharon [name]    Labs: Basic Metabolic Panel: Recent Labs  Lab 10/16/17 1003  10/19/17 0642 10/20/17 0427 10/21/17 0201  NA  --    < > 127* 130* 128*  K  --    < > 4.4 3.6 3.7  CL  --    < > 93* 96* 95*  CO2  --    < > 15* 19*  17*  GLUCOSE  --    < > 84 87 81  BUN  --    < > 93* 83* 83*  CREATININE  --    < > 7.18* 5.97* 5.04*  CALCIUM  --    < > 7.6* 8.0* 8.0*  PHOS 5.6*  --   --   --  4.9*   < > = values in this interval not displayed.   Liver Function Tests: Recent Labs  Lab 10/21/17 0201  ALBUMIN 1.8*   No results for input(s): LIPASE, AMYLASE in the last 168 hours. No results for input(s): AMMONIA in the last 168 hours. CBC: Recent Labs  Lab 10/16/17 0445 10/17/17 0341 10/18/17 0301 10/19/17 0642 10/21/17 0201  WBC 11.7* 9.7 9.0 8.4 9.7  NEUTROABS  --  8.3* 7.7 6.9  --   HGB 9.0* 9.3* 9.0* 8.9* 9.0*  HCT 26.3* 26.6* 25.9* 26.4* 26.6*  MCV 77.6* 75.8* 76.9* 77.4* 78.2  PLT 265 244 212 210 173   Cardiac Enzymes: No results for input(s): CKTOTAL, CKMB, CKMBINDEX, TROPONINI in the last 168 hours. CBG: Recent Labs  Lab 10/17/17 1209 10/18/17 1434 10/19/17 0739 10/20/17 9470  10/21/17 0749  GLUCAP 79 133* 93 78 71    Iron Studies: No results for input(s): IRON, TIBC, TRANSFERRIN, FERRITIN in the last 72 hours. Studies/Results: Dg Chest 2 View  Result Date: 10/20/2017 CLINICAL DATA:  Shortness of Breath EXAM: CHEST  2 VIEW COMPARISON:  October 16, 2017 FINDINGS: Pleural effusions are again noted bilaterally, slightly larger on the right than on the left. There is associated bibasilar atelectasis. There is no frank airspace consolidation appreciable. Heart is upper normal in size with pulmonary vascularity within normal limits. No adenopathy. No evident bone lesions. IMPRESSION: Persistent pleural effusions, slightly larger on the right than on the left, with bibasilar atelectasis. Stable cardiac silhouette. Electronically Signed   By: Lowella Grip III M.D.   On: 10/20/2017 14:10   Medications: Infusions: . heparin 1,000 Units/hr (10/20/17 1910)    Scheduled Medications: . calcium-vitamin D  1 tablet Oral BID  . cholecalciferol  1,000 Units Oral Daily  . ferrous gluconate  325 mg  Oral Q breakfast  . hydrocortisone   Rectal BID  . levothyroxine  25 mcg Oral QAC breakfast  . metoprolol tartrate  5 mg Intravenous Once  . metoprolol tartrate  25 mg Oral BID  . multivitamin with minerals  1 tablet Oral Daily  . nystatin  5 mL Mouth/Throat QID  . vitamin B-12  1,000 mcg Oral Daily    have reviewed scheduled and prn medications.  Physical Exam: General: easily arousable today- no c/o's- not sure she understands events  Heart: RRR Lungs: mostly clear Abdomen: soft, non tender- darker urine from nephrostomy tube-  Extremities: pitting edema    10/21/2017,11:03 AM  LOS: 6 days

## 2017-10-21 NOTE — Progress Notes (Signed)
Called by CCMD at approximately [phone] and informed that pt had converted into A. Fib with HR in the 150's.  Assessed pt, no complaints of chest pain or SOB.  Vitals @ 0421 BP 83/52, T 98.3, RR 16, SaO2 94% @ 2L [address].  Rapid response called.  Paged Jeannette Corpus who ordered 0.25mg  digoxin.  Digoxin administered @ 0447.  Vitals @ 0530 BP 107/53, T 97.9, RR 16, SaO2 98% @ 2L [place].  Heart rate on monitor is fluctuating from the low 70's to mid 140's.  XKennon Holter notified of changes, no new orders given.  Monitor HR at 0546 appears to have stabilized in the 90's.  Will continue to monitor pt closely.

## 2017-10-21 NOTE — Progress Notes (Signed)
Occupational Therapy Treatment Patient Details Name: Sharon [name] MRN: [id number] DOB: August 01, 1932 Today's Date: 10/21/2017    History of present illness Patient is an 83 y/o female admitted to hospital with the working diagnosis acute obstructive uropathy, complicated with uremia, metabolic acidosis, hyperkalemia and hyponatremia.  Patient does have significant past history for hypertension, hypothyroidism, iron deficiency anemia and recently diagnosed metastatic urothelial cancer, status post bilateral ureter stent placements due to obstructive uropathy.  R percutaneous nephrostomy placed 12/3.   OT comments  Pt presents supine in bed, lethargic but agreeable to OT treatment session. Pt requires significant time to complete mobility and ADL tasks due to fatigue this session. Pt required ModA for sit<>stand at [place] and MinA for stand pivot to recliner. Began implementing bil UE HEP for increased strengthening/endurance. Pt continues to require Max-total assist for LB ADLs, limited mostly by decreased activity tolerance and generalized weakness. Will continue to follow acutely to maximize Pt's safety and independence with ADLs and mobility. POC remains appropriate.    Follow Up Recommendations  Home health OT;Supervision/Assistance - 24 hour    Equipment Recommendations  None recommended by OT          Precautions / Restrictions Precautions Precautions: Fall Precaution Comments: R nephrostomy Restrictions Weight Bearing Restrictions: No       Mobility Bed Mobility Overal bed mobility: Needs Assistance Bed Mobility: Supine to Sit     Supine to sit: HOB elevated;Max assist     General bed mobility comments: assist to advance LEs and scoot hips towards EOB, assist to elevate trunk into sitting position, increased time   Transfers Overall transfer level: Needs assistance Equipment used: Rolling walker (2 wheeled) Transfers: Sit to/from [facility] Sit to  Stand: Mod assist Stand pivot transfers: Min assist       General transfer comment: Pt requires significant time to initiate sit<>stand at RW due to fatigue, modA to rise and steady at RW with verbal cues for hand placement; once in standing Pt able to take pivotal steps to turn towards recliner with MinA    Balance Overall balance assessment: Needs assistance Sitting-balance support: Single extremity supported Sitting balance-Leahy Scale: Fair Sitting balance - Comments: Pt able to maintain static sitting EOB, however significant forward trunk flexion due to fatigue, required cues to maintain upright position    Standing balance support: Bilateral upper extremity supported Standing balance-Leahy Scale: Poor Standing balance comment: UE support for balance                           ADL either performed or assessed with clinical judgement   ADL Overall ADL's : Needs assistance/impaired     Grooming: Wash/dry face;Set up;Bed level               Lower Body Dressing: Total assistance;Bed level Lower Body Dressing Details (indicate cue type and reason): donning socks             Functional mobility during ADLs: Moderate assistance;Rolling walker General ADL Comments: Pt continues to have decreased activity tolerance/lethargy                        Cognition Arousal/Alertness: Lethargic Behavior During Therapy: WFL for tasks assessed/performed Overall Cognitive Status: Within Functional Limits for tasks assessed  General Comments: sleepy during session, though easily arousable        Exercises General Exercises - Upper Extremity Shoulder Flexion: AROM;5 reps;Seated;Theraband Theraband Level (Shoulder Flexion): Level 1 (Yellow) Shoulder ABduction: AROM;5 reps;Both;Theraband Theraband Level (Shoulder Abduction): Level 1 (Yellow) Shoulder ADduction: AROM;5 reps;Both;Theraband Theraband Level (Shoulder  Adduction): Level 1 (Yellow) Elbow Flexion: AROM;10 reps;Both;Seated Elbow Extension: AROM;Both;10 reps;Seated                Pertinent Vitals/ Pain       Pain Assessment: Faces Faces Pain Scale: No hurt                                                          Frequency  Min 2X/week        Progress Toward Goals  OT Goals(current goals can now be found in the care plan section)  Progress towards OT goals: Progressing toward goals  Acute Rehab OT Goals Patient Stated Goal: To return home with assist OT Goal Formulation: With patient/family Time For Goal Achievement: 11/02/17 Potential to Achieve Goals: Good  Plan Discharge plan remains appropriate                     AM-PAC PT "6 Clicks" Daily Activity     Outcome Measure   Help from another person eating meals?: None Help from another person taking care of personal grooming?: A Little Help from another person toileting, which includes using toliet, bedpan, or urinal?: A Lot Help from another person bathing (including washing, rinsing, drying)?: A Lot Help from another person to put on and taking off regular upper body clothing?: A Little Help from another person to put on and taking off regular lower body clothing?: A Lot 6 Click Score: 16    End of Session Equipment Utilized During Treatment: Gait belt;Rolling walker  OT Visit Diagnosis: Muscle weakness (generalized) (M62.81);Unsteadiness on feet (R26.81);Other abnormalities of gait and mobility (R26.89)   Activity Tolerance Patient tolerated treatment well;Patient limited by lethargy   Patient Left in chair;with call bell/phone within reach;with family/visitor present;with chair alarm set   Nurse Communication Mobility status        Time: 5188-4166 OT Time Calculation (min): 39 min  Charges: OT General Charges $OT Visit: 1 Visit OT Treatments $Self Care/Home Management : 8-22 mins $Therapeutic Activity: 8-22  mins  Lou Cal, OT Pager [phone] 10/21/2017    Raymondo Band 10/21/2017, 4:44 PM

## 2017-10-21 NOTE — Progress Notes (Signed)
Physical Therapy Treatment Patient Details Name: Sharon [name] MRN: [id number] DOB: [birth year] Today's Date: 10/21/2017    History of Present Illness Patient is an 83 y/o female admitted to hospital with the working diagnosis acute obstructive uropathy, complicated with uremia, metabolic acidosis, hyperkalemia and hyponatremia.  Patient does have significant past history for hypertension, hypothyroidism, iron deficiency anemia and recently diagnosed metastatic urothelial cancer, status post bilateral ureter stent placements due to obstructive uropathy.  R percutaneous nephrostomy placed 12/3.    PT Comments    Patient seen for activity progression and in room exercises. Patient limited by fatigue but was able to perform both aspects with assist. Max multi modal cues due to hearing/communication difficulties. Current POC remains appropriate. Son at bedside at end of session and very helpful.   Follow Up Recommendations  Home health PT;Supervision/Assistance - 24 hour(HH aide)     Equipment Recommendations  None recommended by PT    Recommendations for Other Services       Precautions / Restrictions Precautions Precautions: Fall Precaution Comments: R nephrostomy Restrictions Weight Bearing Restrictions: No    Mobility  Bed Mobility Overal bed mobility: Needs Assistance Bed Mobility: Supine to Sit     Supine to sit: HOB elevated;Max assist     General bed mobility comments: Pt OOB in recliner when OT arrived  Transfers Overall transfer level: Needs assistance Equipment used: Rolling walker (2 wheeled) Transfers: Sit to/from [facility] Sit to Stand: Mod assist Stand pivot transfers: Min assist       General transfer comment: Increased time, cues and assist to power to upright. Vcs for hand placement and positioning  Ambulation/Gait Ambulation/Gait assistance: Min assist Ambulation Distance (Feet): 6 Feet Assistive device: Rolling walker (2  wheeled) Gait Pattern/deviations: Trunk flexed;Wide base of support;Shuffle;Step-to pattern Gait velocity: decreased   General Gait Details: patient able to take a few in room steps but limited by fatigue and activity tolerance. Heavy reliance on RW for UE support   Stairs            Wheelchair Mobility    Modified Rankin (Stroke Patients Only)       Balance Overall balance assessment: Needs assistance Sitting-balance support: No upper extremity supported Sitting balance-Leahy Scale: Fair Sitting balance - Comments: Pt able to maintain static sitting EOB, however significant forward trunk flexion due to fatigue, required cues to maintain upright position    Standing balance support: Bilateral upper extremity supported Standing balance-Leahy Scale: Poor Standing balance comment: Required UE support throughout session                             Cognition Arousal/Alertness: Awake/alert Behavior During Therapy: WFL for tasks assessed/performed Overall Cognitive Status: Within Functional Limits for tasks assessed                                 General Comments: Extremely HOH      Exercises      General Comments        Pertinent Vitals/Pain Pain Assessment: Faces Pain Score: 0-No pain Faces Pain Scale: No hurt    Home Living                      Prior Function            PT Goals (current goals can now be found in the care plan section)  Acute Rehab PT Goals Patient Stated Goal: To return home with assist PT Goal Formulation: With patient/family Time For Goal Achievement: 11/02/17 Potential to Achieve Goals: Good Progress towards PT goals: Progressing toward goals    Frequency    Min 3X/week      PT Plan Current plan remains appropriate    Co-evaluation              AM-PAC PT "6 Clicks" Daily Activity  Outcome Measure  Difficulty turning over in bed (including adjusting bedclothes, sheets and  blankets)?: A Lot Difficulty moving from lying on back to sitting on the side of the bed? : Unable Difficulty sitting down on and standing up from a chair with arms (e.g., wheelchair, bedside commode, etc,.)?: Unable Help needed moving to and from a bed to chair (including a wheelchair)?: A Lot Help needed walking in hospital room?: A Lot Help needed climbing 3-5 steps with a railing? : Total 6 Click Score: 9    End of Session Equipment Utilized During Treatment: Gait belt Activity Tolerance: Patient limited by fatigue Patient left: in chair;with family/visitor present;with call bell/phone within reach   PT Visit Diagnosis: Difficulty in walking, not elsewhere classified (R26.2);Muscle weakness (generalized) (M62.81)     Time: 5597-4163 PT Time Calculation (min) (ACUTE ONLY): 17 min  Charges:  $Therapeutic Activity: 8-22 mins                    G Codes:       Alben Deeds, PT DPT  Board Certified Neurologic Specialist [place] 10/21/2017, 5:13 PM

## 2017-10-22 LAB — GLUCOSE, CAPILLARY
Glucose-Capillary: 67 mg/dL (ref 65–99)
Glucose-Capillary: 72 mg/dL (ref 65–99)

## 2017-10-22 LAB — RENAL FUNCTION PANEL
Albumin: 1.7 g/dL — ABNORMAL LOW (ref 3.5–5.0)
Anion gap: 15 (ref 5–15)
BUN: 75 mg/dL — ABNORMAL HIGH (ref 6–20)
CALCIUM: 8 mg/dL — AB (ref 8.9–10.3)
CO2: 18 mmol/L — AB (ref 22–32)
CREATININE: 4.27 mg/dL — AB (ref 0.44–1.00)
Chloride: 97 mmol/L — ABNORMAL LOW (ref 101–111)
GFR calc Af Amer: 10 mL/min — ABNORMAL LOW (ref >60)
GFR calc non Af Amer: 9 mL/min — ABNORMAL LOW (ref >60)
GLUCOSE: 81 mg/dL (ref 65–99)
Phosphorus: 4.6 mg/dL (ref 2.5–4.6)
Potassium: 3.7 mmol/L (ref 3.5–5.1)
SODIUM: 130 mmol/L — AB (ref 135–145)

## 2017-10-22 LAB — HEPARIN LEVEL (UNFRACTIONATED): HEPARIN UNFRACTIONATED: 0.55 [IU]/mL (ref 0.30–0.70)

## 2017-10-22 MED ORDER — METOPROLOL TARTRATE 12.5 MG HALF TABLET
12.5000 mg | ORAL_TABLET | Freq: Two times a day (BID) | ORAL | Status: DC
  Filled 2017-10-22 (×8): qty 1

## 2017-10-22 MED ORDER — SODIUM CHLORIDE 0.9 % IV BOLUS (SEPSIS)
250.0000 mL | Freq: Once | INTRAVENOUS | Status: AC
  Administered 2017-10-22: 250 mL via INTRAVENOUS

## 2017-10-22 NOTE — Progress Notes (Signed)
BP 82/40. MD notified.

## 2017-10-22 NOTE — Progress Notes (Signed)
Pt. BP 84/50 manual. MD notified. No new orders received, but we will continue to monitor BP and watch MAP.

## 2017-10-22 NOTE — Progress Notes (Signed)
PROGRESS NOTE    Sharon [name]  VUD:[id number] DOB: [birth year] DOA: 10/23/2017 PCP: Ann Held, DO    Brief Narrative:  83 year old female who presented with decreased urine output. Patient does haveasignificant pastmedicalhistory for hypertension, hypothyroidism, iron deficiency anemiaandrecently diagnosed metastatic urothelial cancer,status post bilateral ureter stent placements due to obstructive uropathy(discharged 1 week prior to hospitalization).Patient was noted to have a drastic decrease in urine output, associated with worsening lower extremity edema.Her blood pressure was 142/52,heart rate 73, respiratory rate 18, temperature 98.3,oxygen saturations 95%.Moist mucous membranes, lungs clear to auscultation bilaterally, heart S1-S2 present rhythmic,abdomen soft,3+ pitting lowerextremityedema.Sodium 119, potassium 6.0, chloride 84, bicarbonate 16, glucose 125, BUN95, creatinine 8.39,calcium 6.2,white cell count 13.7, hemoglobin 9.9, hematocrit 27.7, platelets253.Urinalysis with too numerous, RBCs, and too numerous to count white cells.Chest x-ray with right rotation, bilateral pleural effusions more rightthanleft.CT of the abdomen with multiple lesions throughout the liver most suggestive of metastatic disease, extensive bony metastatic disease in the spine and pelvis, mild bilateral hydronephrosis right greater than left, large bilateral pleural effusions.  Patient was admitted tothehospital with the working diagnosis acute obstructive uropathy, complicated with uremia, metabolic acidosis,hyperkalemia and hyponatremia.   Assessment & Plan:   Principal Problem:   Uremia Active Problems:   Hypothyroidism   Essential hypertension   Iron deficiency anemia   Hyponatremia   Hyperkalemia   Metabolic acidosis   Hypocalcemia   Urothelial cancer ([place])   Acute renal failure ([place])   Palliative care by specialist   Goals of care,  counseling/discussion   1.Acute obstructive uropathy with acute kidney injurywith hyponatremia, hyperkalemia and metabolic acidosis.Continue to improve renal function with serum cr down to 4,27 from 5,04, urine output increased to 890 over last 24 hours. Improve serum bicarbonate up to 18 from 17, stable serum K at 3,7. Noted improved in peripheral edema.   2. New onset atrial fibrillation with rapid ventricular response. Patient has converted to sinus rhythm, will decrease metoprolol to 12.5 bid to prevent further hypotension, continue anticoagulation with heparin, will transition to oral agents when GFR stable. Will target MAP of 60. Echocardiography with normal LV systolic function, inferior vena cava with normal respiratory variation.   2.Metastatic urothelial cancer bilateral ureteral stents obstructed, sp right nephrostomy tube. Will need outpatient follow up with Oncology and Urology. Plan to placed larger stents as outpatient.  3.Hypertension.Will decreased metoprolol and will continue close monitoring, will target a mean arterial pressure of 60, will hold on IV fluids due to edematous state.  4.Hypothyroidism. Onlevothyroxine.  5.Iron deficiency anemia. No signs of bleeding.   DVT prophylaxis:heparin Code Status:full Family Communication:No family at beside.  Disposition Plan:home   Consultants:  IR  Nephrology  Procedures:  IR right percutaneous nephrostomy tube.  Antimicrobials:  Subjective: Patient feeling better, no nausea or vomiting, no chest pain or palpitations, dyspnea has improved. Still very weak and deconditioned.   Objective: Vitals:   10/21/17 1734 10/21/17 1735 10/21/17 2245 10/22/17 0500  BP: (!) 106/54 (!) 106/54 (!) 108/50 (!) 113/45  Pulse: 83 82 74 84  Resp:   17 17  Temp:   97.8 F (36.6 C) 98.2 F (36.8 C)  TempSrc:   Oral Oral  SpO2:  100% 97% 98%  Weight:    68.9 kg (151 lb 14.4 oz)  Height:         Intake/Output Summary (Last 24 hours) at 10/22/2017 0815 Last data filed at 10/22/2017 0616 Gross per 24 hour  Intake 460 ml  Output 890 ml  Net -  430 ml   Filed Weights   10/19/17 0508 10/21/17 0509 10/22/17 0500  Weight: 68 kg (149 lb 14.6 oz) 67.1 kg (147 lb 14.9 oz) 68.9 kg (151 lb 14.4 oz)    Examination:   General: Not in pain or dyspnea, deconditioned Neurology: Awake and alert, non focal  E ENT: mild pallor, no icterus, oral mucosa moist Cardiovascular: No JVD. S1-S2 present, rhythmic, no gallops, rubs, or murmurs. ++/+++ pitting lower extremity edema. Pulmonary: decreased breath sounds bilaterally at bases, adequate air movement, no wheezing, rhonchi or rales. Gastrointestinal. Abdomen protuberant, no organomegaly, non tender, no rebound or guarding Skin. No rashes Musculoskeletal: no joint deformities     Data Reviewed: I have personally reviewed following labs and imaging studies  CBC: Recent Labs  Lab 10/16/17 0445 10/17/17 0341 10/18/17 0301 10/19/17 0642 10/21/17 0201  WBC 11.7* 9.7 9.0 8.4 9.7  NEUTROABS  --  8.3* 7.7 6.9  --   HGB 9.0* 9.3* 9.0* 8.9* 9.0*  HCT 26.3* 26.6* 25.9* 26.4* 26.6*  MCV 77.6* 75.8* 76.9* 77.4* 78.2  PLT 265 244 212 210 643   Basic Metabolic Panel: Recent Labs  Lab 10/16/17 1003  10/18/17 0301 10/19/17 0642 10/20/17 0427 10/21/17 0201 10/22/17 0214  NA  --    < > 123* 127* 130* 128* 130*  K  --    < > 4.6 4.4 3.6 3.7 3.7  CL  --    < > 89* 93* 96* 95* 97*  CO2  --    < > 14* 15* 19* 17* 18*  GLUCOSE  --    < > 85 84 87 81 81  BUN  --    < > 100* 93* 83* 83* 75*  CREATININE  --    < > 8.63* 7.18* 5.97* 5.04* 4.27*  CALCIUM  --    < > 6.6* 7.6* 8.0* 8.0* 8.0*  MG 1.9  --   --   --   --   --   --   PHOS 5.6*  --   --   --   --  4.9* 4.6   < > = values in this interval not displayed.   GFR: Estimated Creatinine Clearance: 9.3 mL/min (A) (by C-G formula based on SCr of 4.27 mg/dL (H)). Liver Function  Tests: Recent Labs  Lab 10/21/17 0201 10/22/17 0214  ALBUMIN 1.8* 1.7*   No results for input(s): LIPASE, AMYLASE in the last 168 hours. No results for input(s): AMMONIA in the last 168 hours. Coagulation Profile: Recent Labs  Lab 10/17/17 1409  INR 1.28   Cardiac Enzymes: No results for input(s): CKTOTAL, CKMB, CKMBINDEX, TROPONINI in the last 168 hours. BNP (last 3 results) No results for input(s): PROBNP in the last 8760 hours. HbA1C: No results for input(s): HGBA1C in the last 72 hours. CBG: Recent Labs  Lab 10/17/17 1209 10/18/17 1434 10/19/17 0739 10/20/17 0743 10/21/17 0749  GLUCAP 79 133* 93 78 71   Lipid Profile: No results for input(s): CHOL, HDL, LDLCALC, TRIG, CHOLHDL, LDLDIRECT in the last 72 hours. Thyroid Function Tests: No results for input(s): TSH, T4TOTAL, FREET4, T3FREE, THYROIDAB in the last 72 hours. Anemia Panel: No results for input(s): VITAMINB12, FOLATE, FERRITIN, TIBC, IRON, RETICCTPCT in the last 72 hours.    Radiology Studies: I have reviewed all of the imaging during this hospital visit personally     Scheduled Meds: . calcium-vitamin D  1 tablet Oral BID  . cholecalciferol  1,000 Units Oral Daily  . ferrous  gluconate  325 mg Oral Q breakfast  . hydrocortisone   Rectal BID  . levothyroxine  25 mcg Oral QAC breakfast  . metoprolol tartrate  25 mg Oral BID  . multivitamin with minerals  1 tablet Oral Daily  . nystatin  5 mL Mouth/Throat QID  . vitamin B-12  1,000 mcg Oral Daily   Continuous Infusions: . heparin 1,000 Units/hr (10/21/17 1400)     LOS: 7 days        Mauricio Gerome Apley, MD [facility] Pager [phone]

## 2017-10-22 NOTE — Progress Notes (Signed)
Subjective:   890 UOP recorded - crt down  to 4.27.   Afib now rate controlled soft BPs but no overt hypotension  Objective Vital signs in last 24 hours: Vitals:   10/21/17 1735 10/21/17 2245 10/22/17 0500 10/22/17 0818  BP: (!) 106/54 (!) 108/50 (!) 113/45 (!) 112/57  Pulse: 82 74 84 80  Resp:  17 17   Temp:  97.8 F (36.6 C) 98.2 F (36.8 C)   TempSrc:  Oral Oral   SpO2: 100% 97% 98%   Weight:   68.9 kg (151 lb 14.4 oz)   Height:       Weight change: 1.8 kg (3 lb 15.5 oz)  Intake/Output Summary (Last 24 hours) at 10/22/2017 1011 Last data filed at 10/22/2017 0350 Gross per 24 hour  Intake 460 ml  Output 890 ml  Net -430 ml    Assessment/ Plan: Pt is a 83 y.o. yo female who was admitted on 11/14/2017 with weakness- recent dx of metastatic urothelial CA- s/p bilat ureteral stents but now with cont hydro and AKI  Assessment/Plan: 1. Acute renal failure - recurrent issue w/ recent bilat ureteral stent placement in [place], [place] 3 wks PTA.  Repeat  CT showed an atrophic L kidney , may not be functional. Right kidney obstructed- now s/p nephrost tube with initially great UOP now not well recorded but crt down. No absolute indication for acute dialysis and would hesitate to use in this setting as do not know prognosis- Anticipate cont improvement in renal function but she is tenuous  2. HTN/volume- if anything overloaded- BP is fine - low dose lopressor- -  stopped IVF 3. Anemia- dropping some- supportive care 4. Hypocalcemia- now on calc and vit D- improved 5. Hyponatremia- likely due to volume overload-improving slowly with autodiuresis post obstruction  Renal will not see her on Sunday- will revisit Monday - call with any questions tomorrow    Noha Karasik A    Labs: Basic Metabolic Panel: Recent Labs  Lab 10/16/17 1003  10/20/17 0427 10/21/17 0201 10/22/17 0214  NA  --    < > 130* 128* 130*  K  --    < > 3.6 3.7 3.7  CL  --    < > 96* 95* 97*  CO2  --    < >  19* 17* 18*  GLUCOSE  --    < > 87 81 81  BUN  --    < > 83* 83* 75*  CREATININE  --    < > 5.97* 5.04* 4.27*  CALCIUM  --    < > 8.0* 8.0* 8.0*  PHOS 5.6*  --   --  4.9* 4.6   < > = values in this interval not displayed.   Liver Function Tests: Recent Labs  Lab 10/21/17 0201 10/22/17 0214  ALBUMIN 1.8* 1.7*   No results for input(s): LIPASE, AMYLASE in the last 168 hours. No results for input(s): AMMONIA in the last 168 hours. CBC: Recent Labs  Lab 10/16/17 0445 10/17/17 0341 10/18/17 0301 10/19/17 0642 10/21/17 0201  WBC 11.7* 9.7 9.0 8.4 9.7  NEUTROABS  --  8.3* 7.7 6.9  --   HGB 9.0* 9.3* 9.0* 8.9* 9.0*  HCT 26.3* 26.6* 25.9* 26.4* 26.6*  MCV 77.6* 75.8* 76.9* 77.4* 78.2  PLT 265 244 212 210 173   Cardiac Enzymes: No results for input(s): CKTOTAL, CKMB, CKMBINDEX, TROPONINI in the last 168 hours. CBG: Recent Labs  Lab 10/18/17 1434 10/19/17 0739 10/20/17 0743 10/21/17  0749 10/22/17 0830  GLUCAP 133* 93 78 71 67    Iron Studies: No results for input(s): IRON, TIBC, TRANSFERRIN, FERRITIN in the last 72 hours. Studies/Results: Dg Chest 2 View  Result Date: 10/20/2017 CLINICAL DATA:  Shortness of Breath EXAM: CHEST  2 VIEW COMPARISON:  October 16, 2017 FINDINGS: Pleural effusions are again noted bilaterally, slightly larger on the right than on the left. There is associated bibasilar atelectasis. There is no frank airspace consolidation appreciable. Heart is upper normal in size with pulmonary vascularity within normal limits. No adenopathy. No evident bone lesions. IMPRESSION: Persistent pleural effusions, slightly larger on the right than on the left, with bibasilar atelectasis. Stable cardiac silhouette. Electronically Signed   By: Lowella Grip III M.D.   On: 10/20/2017 14:10   Medications: Infusions: . heparin 1,000 Units/hr (10/21/17 1400)    Scheduled Medications: . calcium-vitamin D  1 tablet Oral BID  . cholecalciferol  1,000 Units Oral Daily  .  ferrous gluconate  325 mg Oral Q breakfast  . hydrocortisone   Rectal BID  . levothyroxine  25 mcg Oral QAC breakfast  . metoprolol tartrate  25 mg Oral BID  . multivitamin with minerals  1 tablet Oral Daily  . nystatin  5 mL Mouth/Throat QID  . vitamin B-12  1,000 mcg Oral Daily    have reviewed scheduled and prn medications.  Physical Exam: General: sleepy but arouable- no c/o's- not sure she understands events  Heart: RRR Lungs: mostly clear Abdomen: soft, non tender- darker urine from nephrostomy tube-  Extremities: pitting edema    10/22/2017,10:11 AM  LOS: 7 days

## 2017-10-22 NOTE — Progress Notes (Signed)
ANTICOAGULATION CONSULT NOTE - Follow Up Consult  Pharmacy Consult for Heparin Indication: atrial fibrillation  No Known Allergies  Patient Measurements: Height: 5\' 3"  (160 cm) Weight: 151 lb 14.4 oz (68.9 kg) IBW/kg (Calculated) : 52.4 Heparin Dosing Weight: 67 kg  Vital Signs: Temp: 98.2 F (36.8 C) (12/08 0500) Temp Source: Oral (12/08 0500) BP: 113/45 (12/08 0500) Pulse Rate: 84 (12/08 0500)  Labs: Recent Labs    10/20/17 0427 10/21/17 0201 10/21/17 1129 10/22/17 0214  HGB  --  9.0*  --   --   HCT  --  26.6*  --   --   PLT  --  173  --   --   HEPARINUNFRC  --  0.48 0.53 0.55  CREATININE 5.97* 5.04*  --  4.27*    Estimated Creatinine Clearance: 9.3 mL/min (A) (by C-G formula based on SCr of 4.27 mg/dL (H)).  Assessment: 83 year old female admitted on 10/18/2017 with decreased urine output and worsening LE edema found to have acute obstructive uropathy and AKI and metastatic urothelial cancer with mets to liver. On 12/6, patient developed new atrial fibrillation with HR in 130s, CHADsVASc score 4. Pharmacy consulted to dose heparin.   Heparin level remains therapeutic (0.55) on 1000 units/hr. CBC from 12/7 shows low Hgb, but stable, plts ok. CBC not ordered for this AM. No bleeding reported. Patient's Scr is slowly improving, no indication for HD yet.   Goal of Therapy:  Heparin level 0.3-0.7 units/ml Monitor platelets by anticoagulation protocol: Yes   Plan:  Continue Heparin drip at 1000 units/hr Daily heparin level and CBC while on Heparin. Follow up anticoagulation plans  Leroy Libman, PharmD Pharmacy Resident Pager: [phone]

## 2017-10-22 NOTE — Progress Notes (Addendum)
Pt. hypoglycemic CBG - 67, meal tray in front of patient. MD made aware. Pt., asymptomatic. Repeat CBG- 72. Will continue to monitor.

## 2017-10-22 NOTE — Progress Notes (Signed)
Patient ID: Sharon [name], female   DOB: 1932/02/11, 83 y.o.   MRN: [id number]    Referring Physician(s): Dr. Irine Seal  Supervising Physician: Jacqulynn Cadet  Patient Status: [facility] - In-pt  Chief Complaint: Hydronephrosis, right  Subjective: Patient arouses today.  Family at bedside.  Allergies: Patient has no known allergies.  Medications: Prior to Admission medications   Medication Sig Start Date End Date Taking? Authorizing Provider  Calcium Carbonate-Vitamin D (CALCIUM + D) 600-200 MG-UNIT TABS Take 2 tablets by mouth daily.     Yes [provider]  cholecalciferol (VITAMIN D) 1000 UNITS tablet Take 1,000 Units by mouth daily.     Yes [provider]  cyclobenzaprine (FLEXERIL) 5 MG tablet Take 5 mg by mouth 3 (three) times daily as needed for muscle spasms.   Yes [provider]  ferrous gluconate (FERGON) 325 MG tablet Take 325 mg by mouth daily with breakfast.     Yes [provider]  Flaxseed, Linseed, (FLAX SEED OIL) 1000 MG CAPS Take 1 capsule by mouth daily.     Yes [provider]  furosemide (LASIX) 40 MG tablet Take 40 mg by mouth daily.   Yes [provider]  levothyroxine (SYNTHROID, LEVOTHROID) 25 MCG tablet 1 po qd 09/25/12  Yes Roma Schanz R, DO  metoprolol tartrate (LOPRESSOR) 25 MG tablet Take 25 mg by mouth 2 (two) times daily.   Yes [provider]  Multiple Vitamin (MULTIVITAMIN) tablet Take 1 tablet by mouth daily.     Yes [provider]  vitamin B-12 (CYANOCOBALAMIN) 1000 MCG tablet Take 1,000 mcg by mouth daily.     Yes [provider]  zoster vaccine live, PF, (ZOSTAVAX) 66440 UNT/0.65ML injection Inject 19,400 Units into the skin once. 09/25/12  Yes Roma Schanz R, DO  alendronate (FOSAMAX) 70 MG tablet Take with a full glass of water on an empty stomach. Patient not taking: Reported on 11/05/2017 09/25/12   Carollee Herter, Alferd Apa, DO  ciprofloxacin  (CIPRO) 250 MG tablet Take 1 tablet (250 mg total) by mouth 2 (two) times daily. Patient not taking: Reported on 10/19/2017 09/27/12   Carollee Herter, Alferd Apa, DO  lisinopril-hydrochlorothiazide (PRINZIDE,ZESTORETIC) 10-12.5 MG per tablet 1 tab by mouth daily--Office visit due now Patient not taking: Reported on 11/10/2017 09/21/13   Carollee Herter, Alferd Apa, DO    Vital Signs: BP (!) 112/57   Pulse 80   Temp 98.2 F (36.8 C) (Oral)   Resp 17   Ht 5\' 3"  (1.6 m)   Wt 151 lb 14.4 oz (68.9 kg)   SpO2 98%   BMI 26.91 kg/m   Physical Exam: Abd: R PCN in place, site c/d/i. Dark yellow output in collection bag.   Imaging: Dg Chest 2 View  Result Date: 10/20/2017 CLINICAL DATA:  Shortness of Breath EXAM: CHEST  2 VIEW COMPARISON:  October 16, 2017 FINDINGS: Pleural effusions are again noted bilaterally, slightly larger on the right than on the left. There is associated bibasilar atelectasis. There is no frank airspace consolidation appreciable. Heart is upper normal in size with pulmonary vascularity within normal limits. No adenopathy. No evident bone lesions. IMPRESSION: Persistent pleural effusions, slightly larger on the right than on the left, with bibasilar atelectasis. Stable cardiac silhouette. Electronically Signed   By: Lowella Grip III M.D.   On: 10/20/2017 14:10    Labs:  CBC: Recent Labs    10/17/17 0341 10/18/17 0301 10/19/17 0642 10/21/17 0201  WBC 9.7  9.0 8.4 9.7  HGB 9.3* 9.0* 8.9* 9.0*  HCT 26.6* 25.9* 26.4* 26.6*  PLT 244 212 210 173    COAGS: Recent Labs    10/17/17 1409  INR 1.28  APTT 43*    BMP: Recent Labs    10/19/17 0642 10/20/17 0427 10/21/17 0201 10/22/17 0214  NA 127* 130* 128* 130*  K 4.4 3.6 3.7 3.7  CL 93* 96* 95* 97*  CO2 15* 19* 17* 18*  GLUCOSE 84 87 81 81  BUN 93* 83* 83* 75*  CALCIUM 7.6* 8.0* 8.0* 8.0*  CREATININE 7.18* 5.97* 5.04* 4.27*  GFRNONAA 5* 6* 7* 9*  GFRAA 5* 7* 8* 10*    LIVER FUNCTION TESTS: Recent Labs     10/21/17 0201 10/22/17 0214  ALBUMIN 1.8* 1.7*    Assessment and Plan: Right hydronephrosis, s/p PCN placement 10/17/17 Drain remains in good position with continued dark output.  Plans are per Urology.  IR available if needed.   Electronically Signed: Docia Barrier 10/22/2017, 11:18 AM   I spent a total of 15 Minutes at the the patient's bedside AND on the patient's hospital floor or unit, greater than 50% of which was counseling/coordinating care for right hydronephrosis secondary to obstructed stents.

## 2017-10-23 LAB — RENAL FUNCTION PANEL
ALBUMIN: 1.8 g/dL — AB (ref 3.5–5.0)
Anion gap: 16 — ABNORMAL HIGH (ref 5–15)
BUN: 79 mg/dL — AB (ref 6–20)
CO2: 17 mmol/L — ABNORMAL LOW (ref 22–32)
CREATININE: 4.15 mg/dL — AB (ref 0.44–1.00)
Calcium: 7.9 mg/dL — ABNORMAL LOW (ref 8.9–10.3)
Chloride: 98 mmol/L — ABNORMAL LOW (ref 101–111)
GFR calc Af Amer: 11 mL/min — ABNORMAL LOW (ref >60)
GFR calc non Af Amer: 9 mL/min — ABNORMAL LOW (ref >60)
GLUCOSE: 62 mg/dL — AB (ref 65–99)
PHOSPHORUS: 4.8 mg/dL — AB (ref 2.5–4.6)
POTASSIUM: 3.9 mmol/L (ref 3.5–5.1)
Sodium: 131 mmol/L — ABNORMAL LOW (ref 135–145)

## 2017-10-23 LAB — GLUCOSE, CAPILLARY
GLUCOSE-CAPILLARY: 61 mg/dL — AB (ref 65–99)
Glucose-Capillary: 60 mg/dL — ABNORMAL LOW (ref 65–99)
Glucose-Capillary: 140 mg/dL — ABNORMAL HIGH (ref 65–99)
Glucose-Capillary: 108 mg/dL — ABNORMAL HIGH (ref 65–99)
Glucose-Capillary: 75 mg/dL (ref 65–99)

## 2017-10-23 LAB — CBC
HEMATOCRIT: 25.7 % — AB (ref 36.0–46.0)
Hemoglobin: 8.7 g/dL — ABNORMAL LOW (ref 12.0–15.0)
MCH: 26.8 pg (ref 26.0–34.0)
MCHC: 33.9 g/dL (ref 30.0–36.0)
MCV: 79.1 fL (ref 78.0–100.0)
Platelets: 132 10*3/uL — ABNORMAL LOW (ref 150–400)
RBC: 3.25 MIL/uL — ABNORMAL LOW (ref 3.87–5.11)
RDW: 16.9 % — AB (ref 11.5–15.5)
WBC: 9.6 10*3/uL (ref 4.0–10.5)

## 2017-10-23 LAB — HEPARIN LEVEL (UNFRACTIONATED): HEPARIN UNFRACTIONATED: 0.53 [IU]/mL (ref 0.30–0.70)

## 2017-10-23 MED ORDER — GLUCOSE 40 % PO GEL
ORAL | Status: AC
  Administered 2017-10-23: 37.5 g
  Filled 2017-10-23: qty 1

## 2017-10-23 MED ORDER — DEXTROSE 50 % IV SOLN
INTRAVENOUS | Status: AC
  Administered 2017-10-23: 25 mL
  Filled 2017-10-23: qty 50

## 2017-10-23 MED ORDER — FERROUS GLUCONATE 324 (38 FE) MG PO TABS
324.0000 mg | ORAL_TABLET | Freq: Every day | ORAL | Status: DC
  Administered 2017-10-23 – 2017-10-28 (×6): 324 mg via ORAL
  Filled 2017-10-23 (×6): qty 1

## 2017-10-23 MED ORDER — FERROUS GLUCONATE 324 (38 FE) MG PO TABS: 324.0000 mg | ORAL_TABLET | Freq: Every day | ORAL | Status: DC

## 2017-10-23 NOTE — Progress Notes (Signed)
Hypoglycemic Event  CBG: 61  Treatment: D50 IV 25 mL  Symptoms: None  Follow-up CBG: [id number] CBG Result:140  Possible Reasons for Event: Inadequate meal intake      Sharon [name]

## 2017-10-23 NOTE — Progress Notes (Signed)
[facility]                                                                              Patient Demographics  Sharon [name], is a 83 y.o. female, DOB - 06-Nov-1932, PPI:[id number]  Admit date - 10/26/2017   Admitting Physician Ivor Costa, MD  Outpatient Primary MD for the patient is Carollee Herter, Alferd Apa, DO  Outpatient specialists:   LOS - 8  days   Medical records reviewed and are as summarized below:    Chief Complaint  Patient presents with  . Urinary Retention  . Weakness  . Cancer Patient       Brief summary   83 year old female who presented with decreased urine output. Patient does haveasignificant pastmedicalhistory for hypertension, hypothyroidism, iron deficiency anemiaandrecently diagnosed metastatic urothelial cancer,status post bilateral ureter stent placements due to obstructive uropathy(discharged 1 week prior to hospitalization).Patient was noted to have a drastic decrease in urine output, associated with worsening lower extremity edema.  Creatinine 8.39 on admission, hemoglobin 9.9, sodium 119, potassium 6.0, bicarb 16 Patient was admitted tothehospital with the working diagnosis acute obstructive uropathy, complicated with uremia, metabolic acidosis,hyperkalemia and hyponatremia. CT of the abdomen with multiple lesions throughout the liver most suggestive of metastatic disease, extensive bony metastatic disease in the spine and pelvis, mild bilateral hydronephrosis right greater than left, large bilateral pleural effusions.    Assessment & Plan    Principal Problem:   Uremia/acute obstructive uropathy with acute kidney injury, presented with hyponatremia, uremia, hyperkalemia, metabolic acidosis -Still has peripheral edema however creatinine function is improving, urine output improving -Creatinine improved to 4.15 today (8.39 on admission) IR was consulted status post right percutaneous nephrostomy tube-  -Nephrology  following, no acute indication for dialysis  Active Problems: Hyponatremia - Likely due to volume overload improving, autodiuresis after obstruction relieved   New onset atrial fibrillation with RVR -Now converted to normal sinus rhythm, -BP soft, continue low-dose metoprolol -On IV heparin drip for anticoagulation -2D echo showed normal LV systolic function, EF 06-30%, grade 1 diastolic dysfunction.  Hypertension -Currently BP is soft and borderline, on low-dose metoprolol  Metastatic urothelial cancer, bilateral ureteral stents obstructed status post right nephrostomy tube -Status post right percutaneous nephrostomy tube by IR -Appreciate urology recommendations, note reviewed (Dr. Jeffie Pollock on 12/4), recommended replacing her current stents with larger caliber once medical condition has improved - will also need oncology follow-up outpatient    Hypothyroidism -Continue levothyroxine    Iron deficiency anemia -No GI bleeding, H&H stable, follow closely  Code Status: Full CODE STATUS DVT Prophylaxis: IV heparin drip Family Communication: Discussed in detail with the patient, all imaging results, lab results explained to the patient and husband at the bedside  Disposition Plan:   Time Spent in minutes   35 minutes  Procedures:  IR right percutaneous nephrostomy tube  Consultants:   IR Nephrology  Antimicrobials:      Medications  Scheduled Meds: . calcium-vitamin D  1 tablet Oral BID  . cholecalciferol  1,000 Units Oral Daily  . ferrous gluconate  324 mg Oral Q breakfast  . hydrocortisone   Rectal BID  . levothyroxine  25 mcg Oral QAC breakfast  . metoprolol tartrate  12.5 mg Oral BID  . multivitamin with minerals  1 tablet Oral Daily  . nystatin  5 mL Mouth/Throat QID  . vitamin B-12  1,000 mcg Oral Daily   Continuous Infusions: . heparin 1,000 Units/hr (10/21/17 1400)   PRN Meds:.acetaminophen, cyclobenzaprine, metoprolol tartrate, ondansetron,  polyethylene glycol, zolpidem   Antibiotics   Anti-infectives (From admission, onward)   Start     Dose/Rate Route Frequency Ordered Stop   10/17/17 1600  ciprofloxacin (CIPRO) IVPB 400 mg     400 mg 200 mL/hr over 60 Minutes Intravenous To Radiology 10/17/17 1507 10/17/17 1840   10/17/17 1525  ciprofloxacin (CIPRO) 400 MG/200ML IVPB    Comments:  [name]   : cabinet override      10/17/17 1525 10/18/17 0344        Subjective:   Sharon [name] was seen and examined today.  No significant complaints, hearing deficits.  Patient denies dizziness, chest pain, shortness of breath, abdominal pain, N/V/D/C, numbess, tingling. No acute events overnight.  No fevers.  Objective:   Vitals:   10/22/17 2100 10/22/17 2202 10/23/17 0557 10/23/17 0835  BP: (!) 96/54 (!) 89/45 (!) 101/47 (!) 100/46  Pulse:  77 82 83  Resp:  18 17   Temp:  (!) 97.5 F (36.4 C) 98 F (36.7 C)   TempSrc:  Oral Oral   SpO2:  100% 98%   Weight:   68.9 kg (151 lb 14.4 oz)   Height:        Intake/Output Summary (Last 24 hours) at 10/23/2017 1000 Last data filed at 10/23/2017 0600 Gross per 24 hour  Intake 487.67 ml  Output 300 ml  Net 187.67 ml     Wt Readings from Last 3 Encounters:  10/23/17 68.9 kg (151 lb 14.4 oz)  09/25/12 68.8 kg (151 lb 9.6 oz)  09/14/11 69.1 kg (152 lb 6.4 oz)     Exam  General: Alert and oriented, NAD, ill-appearing  Eyes:   HEENT:  Atraumatic, normocephalic  Cardiovascular: S1 S2 auscultated, no rubs, murmurs or gallops. Regular rate and rhythm.  Respiratory: Decreased breath sounds at the bases  Gastrointestinal: Soft, nontender, nondistended, + bowel sounds  Ext: 2+ pedal edema bilaterally  Neuro: no new deficit  Musculoskeletal: No digital cyanosis, clubbing  Skin: No rashes  Psych: Normal affect and demeanor, alert and oriented    Data Reviewed:  I have personally reviewed following labs and imaging studies  Micro Results Recent Results  (from the past 240 hour(s))  Culture, Urine     Status: Abnormal   Collection Time: 10/16/17 11:50 PM  Result Value Ref Range Status   Specimen Description URINE, CATHETERIZED  Final   Special Requests NONE  Final   Culture <10,000 COLONIES/mL INSIGNIFICANT GROWTH (A)  Final   Report Status 10/18/2017 FINAL  Final    Radiology Reports Ct Abdomen Pelvis Wo Contrast  Result Date: 10/16/2017 CLINICAL DATA:  Hydronephrosis. History of bilateral renal stents through hydronephrosis. Worsening acute renal failure. EXAM: CT ABDOMEN AND PELVIS WITHOUT CONTRAST TECHNIQUE: Multidetector CT imaging of the abdomen and pelvis was performed following the standard protocol without IV contrast. COMPARISON:  Ultrasound 10/25/2017 FINDINGS: Lower chest: Large bilateral pleural effusions with compressive atelectasis or pneumonia in the lower lobes. Heart is normal size. Hepatobiliary: Numerous ill-defined masses throughout the liver. There are morphologic changes of cirrhosis with nodular contours. Small gallstones layering in the gallbladder. Pancreas: No focal abnormality or ductal  dilatation. Spleen: No focal abnormality.  Normal size. Adrenals/Urinary Tract: Bilateral ureteral stents are in place in the within the bladder. Mild bilateral hydronephrosis, right greater than left. Left kidney is atrophic. Stomach/Bowel: Sigmoid diverticulosis. No active diverticulitis. No evidence of bowel obstruction. Vascular/Lymphatic: Diffuse aortic and iliac calcifications. No aneurysm or adenopathy. Reproductive: Prior hysterectomy.  No adnexal masses. Other: Small amount of free fluid in the pelvis and around the liver and spleen. No free air. Extensive edema throughout the subcutaneous soft tissues. Musculoskeletal: Extensive sclerotic lesions throughout the thoracic and lumbar spine and sacrum compatible with sclerotic metastases. IMPRESSION: Numerous lesions throughout the liver most suggestive of metastatic disease. Morphologic  features of the liver suggests cirrhosis. Extensive bony metastatic disease in the spine and pelvis compatible with sclerotic bone metastases. Mild bilateral hydronephrosis, right greater than left. Left kidney is atrophic. Large bilateral pleural effusions with bilateral lower lobe atelectasis or pneumonia. Cholelithiasis. Sigmoid diverticulosis. Electronically Signed   By: Rolm Baptise M.D.   On: 10/16/2017 15:12   Dg Chest 2 View  Result Date: 10/20/2017 CLINICAL DATA:  Shortness of Breath EXAM: CHEST  2 VIEW COMPARISON:  October 16, 2017 FINDINGS: Pleural effusions are again noted bilaterally, slightly larger on the right than on the left. There is associated bibasilar atelectasis. There is no frank airspace consolidation appreciable. Heart is upper normal in size with pulmonary vascularity within normal limits. No adenopathy. No evident bone lesions. IMPRESSION: Persistent pleural effusions, slightly larger on the right than on the left, with bibasilar atelectasis. Stable cardiac silhouette. Electronically Signed   By: Lowella Grip III M.D.   On: 10/20/2017 14:10   US Renal  Result Date: 10/17/2017 CLINICAL DATA:  Bilateral hydronephrosis EXAM: RENAL / URINARY TRACT ULTRASOUND COMPLETE COMPARISON:  Abdominal CT from yesterday FINDINGS: Right Kidney: Length: 13 cm. Echogenic with moderate hydronephrosis. The known ureteral stent is difficult to visualize. No mass lesion. Left Kidney: Length: 9 cm, which is overestimated relative to abdominal CT from yesterday (8cm). Echogenic cortex with diffuse cortical thinning. Mild hydronephrosis. Known ureteral stent is difficult to visualize. Bladder: Decompressed. Trace ascites.  Known pleural effusions. IMPRESSION: 1. Moderate right and mild left hydronephrosis that is stable from CT yesterday. 2. Decompressed urinary bladder. 3. Chronic medical renal disease with asymmetric left renal atrophy. 4. Trace ascites. Electronically Signed   By: Monte Fantasia  M.D.   On: 10/17/2017 08:35   US Renal  Result Date: 11/07/2017 CLINICAL DATA:  Renal failure. EXAM: RENAL / URINARY TRACT ULTRASOUND COMPLETE COMPARISON:  None. FINDINGS: Right Kidney: Length: 13.2 cm. Rounded regions of fluid in the left kidney are favored to represent caliectasis. Renal cysts considered less likely. No pelviectasis. No solid mass noted. Left Kidney: Length: 11.5 cm. The left kidney is not well visualized or characterized. Bladder: Decompressed. IMPRESSION: 1. Probable caliectasis in the right kidney. Renal cysts are considered less likely. No gross hydronephrosis with no pelviectasis. 2. The left kidney is not well visualized. 3. The bladder is poorly evaluated due to lack of distention. 4. A small amount of free fluid is seen in the right upper quadrant and pelvis, nonspecific. Electronically Signed   By: Dorise Bullion III M.D   On: 10/28/2017 21:06   Dg Chest Port 1 View  Result Date: 10/16/2017 CLINICAL DATA:  Acute kidney injury. History of metastatic cancer and ureteral stents. EXAM: PORTABLE CHEST 1 VIEW COMPARISON:  Chest radiograph Mar 17, 2015 FINDINGS: Cardiac silhouette is mildly enlarged. Mediastinal silhouette is nonsuspicious. Moderate RIGHT and small  LEFT pleural effusions with underlying consolidation. No pneumothorax. Soft tissue planes included osseous structures are nonsuspicious. IMPRESSION: Moderate RIGHT and small LEFT pleural effusions with underlying consolidation. Recommend follow-up chest radiograph after treatment to exclude underlying mass. Mild cardiomegaly. Electronically Signed   By: Elon Alas M.D.   On: 10/16/2017 20:02   Ir Nephrostomy Placement Right  Result Date: 10/17/2017 INDICATION: 83 year old female with a history of urothelial cancer. She has bilateral ureteral stents which were placed at an outside institution. Unfortunately, she has recurrent right-sided hydronephrosis and worsening renal function consistent with obstructed uropathy.  Placement of a percutaneous nephrostomy tube is warranted for renal decompression. EXAM: IR NEPHROSTOMY PLACEMENT RIGHT COMPARISON:  None. MEDICATIONS: Ciprofloxacin 400 mg IV; The antibiotic was administered in an appropriate time frame prior to skin puncture. ANESTHESIA/SEDATION: Fentanyl 75 mcg IV; Versed 1.5 mg IV Moderate Sedation Time:  15 minutes The patient was continuously monitored during the procedure by the interventional radiology nurse under my direct supervision. CONTRAST:  15 mL Isovue-300 - administered into the collecting system(s) FLUOROSCOPY TIME:  Fluoroscopy Time: 1 minutes 54 seconds (5 mGy). COMPLICATIONS: None immediate. PROCEDURE: Informed written consent was obtained from the patient after a thorough discussion of the procedural risks, benefits and alternatives. All questions were addressed. Maximal Sterile Barrier Technique was utilized including caps, mask, sterile gowns, sterile gloves, sterile drape, hand hygiene and skin antiseptic. A timeout was performed prior to the initiation of the procedure. The right flank was interrogated with ultrasound. The right kidney is identified and is found to be hydronephrotic. A suitable calyx was selected. Local anesthesia was attained by infiltration with 1% lidocaine. A small dermatotomy was made. Under real-time sonographic guidance, a posterior interpolar calyx was punctured with a 21 gauge Accustick needle. There was return of clear urine. A hand injection of contrast material through the needle opacifies the renal collecting system. A 0.018 inch wire was advanced into the renal collecting system. The needle was removed. The Accustick sheath was advanced of the renal collecting system. The 0.018 inch wire was then exchanged for a 0.035 inch wire. The skin tract was dilated to 10 [place]. A Cook 10.2 [place] all-purpose drainage catheter was advanced over the wire and formed in the renal pelvis. A gentle hand injection of contrast material  confirms placement within the renal pelvis. A fluoroscopic spot image was saved. The catheter was connected to gravity bag drainage and secured to the skin with 0 Prolene suture. The patient tolerated the procedure well. IMPRESSION: Successful placement of a 10 French right-sided percutaneous nephrostomy tube. Patient should return Interventional Radiology in 6- 8 weeks for nephrostomy tube check and exchange. Signed, Criselda Peaches, MD [facility] Radiology Electronically Signed   By: Jacqulynn Cadet M.D.   On: 10/17/2017 16:52    Lab Data:  CBC: Recent Labs  Lab 10/17/17 0341 10/18/17 0301 10/19/17 0642 10/21/17 0201 10/23/17 0219  WBC 9.7 9.0 8.4 9.7 9.6  NEUTROABS 8.3* 7.7 6.9  --   --   HGB 9.3* 9.0* 8.9* 9.0* 8.7*  HCT 26.6* 25.9* 26.4* 26.6* 25.7*  MCV 75.8* 76.9* 77.4* 78.2 79.1  PLT 244 212 210 173 276*   Basic Metabolic Panel: Recent Labs  Lab 10/16/17 1003  10/19/17 0642 10/20/17 0427 10/21/17 0201 10/22/17 0214 10/23/17 0219  NA  --    < > 127* 130* 128* 130* 131*  K  --    < > 4.4 3.6 3.7 3.7 3.9  CL  --    < >  93* 96* 95* 97* 98*  CO2  --    < > 15* 19* 17* 18* 17*  GLUCOSE  --    < > 84 87 81 81 62*  BUN  --    < > 93* 83* 83* 75* 79*  CREATININE  --    < > 7.18* 5.97* 5.04* 4.27* 4.15*  CALCIUM  --    < > 7.6* 8.0* 8.0* 8.0* 7.9*  MG 1.9  --   --   --   --   --   --   PHOS 5.6*  --   --   --  4.9* 4.6 4.8*   < > = values in this interval not displayed.   GFR: Estimated Creatinine Clearance: 9.6 mL/min (A) (by C-G formula based on SCr of 4.15 mg/dL (H)). Liver Function Tests: Recent Labs  Lab 10/21/17 0201 10/22/17 0214 10/23/17 0219  ALBUMIN 1.8* 1.7* 1.8*   No results for input(s): LIPASE, AMYLASE in the last 168 hours. No results for input(s): AMMONIA in the last 168 hours. Coagulation Profile: Recent Labs  Lab 10/17/17 1409  INR 1.28   Cardiac Enzymes: No results for input(s):  CKTOTAL, CKMB, CKMBINDEX, TROPONINI in the last 168 hours. BNP (last 3 results) No results for input(s): PROBNP in the last 8760 hours. HbA1C: No results for input(s): HGBA1C in the last 72 hours. CBG: Recent Labs  Lab 10/22/17 0830 10/22/17 1353 10/23/17 0656 10/23/17 0743 10/23/17 0816  GLUCAP 67 72 60* 61* 140*   Lipid Profile: No results for input(s): CHOL, HDL, LDLCALC, TRIG, CHOLHDL, LDLDIRECT in the last 72 hours. Thyroid Function Tests: No results for input(s): TSH, T4TOTAL, FREET4, T3FREE, THYROIDAB in the last 72 hours. Anemia Panel: No results for input(s): VITAMINB12, FOLATE, FERRITIN, TIBC, IRON, RETICCTPCT in the last 72 hours. Urine analysis:    Component Value Date/Time   COLORURINE RED (A) 10/16/2017 1053   APPEARANCEUR TURBID (A) 10/16/2017 1053   LABSPEC  10/16/2017 1053    TEST NOT REPORTED DUE TO COLOR INTERFERENCE OF URINE PIGMENT   PHURINE  10/16/2017 1053    TEST NOT REPORTED DUE TO COLOR INTERFERENCE OF URINE PIGMENT   GLUCOSEU (A) 10/16/2017 1053    TEST NOT REPORTED DUE TO COLOR INTERFERENCE OF URINE PIGMENT   HGBUR (A) 10/16/2017 1053    TEST NOT REPORTED DUE TO COLOR INTERFERENCE OF URINE PIGMENT   HGBUR negative 08/24/2010 0824   BILIRUBINUR (A) 10/16/2017 1053    TEST NOT REPORTED DUE TO COLOR INTERFERENCE OF URINE PIGMENT   BILIRUBINUR Neg 09/25/2012 1453   KETONESUR (A) 10/16/2017 1053    TEST NOT REPORTED DUE TO COLOR INTERFERENCE OF URINE PIGMENT   PROTEINUR (A) 10/16/2017 1053    TEST NOT REPORTED DUE TO COLOR INTERFERENCE OF URINE PIGMENT   UROBILINOGEN 0.2 09/25/2012 1453   UROBILINOGEN 0.2 08/24/2010 0824   NITRITE (A) 10/16/2017 1053    TEST NOT REPORTED DUE TO COLOR INTERFERENCE OF URINE PIGMENT   LEUKOCYTESUR (A) 10/16/2017 1053    TEST NOT REPORTED DUE TO COLOR INTERFERENCE OF URINE PIGMENT     Ripudeep Rai M.D. [facility] 10/23/2017, 10:01 AM  Pager: [phone] Between 7am to 7pm - call Pager -  [phone]  After 7pm go to [email or web address] - password [email or web address]  Call night coverage person covering after 7pm

## 2017-10-23 NOTE — Progress Notes (Signed)
ANTICOAGULATION CONSULT NOTE - Follow Up Consult  Pharmacy Consult for Heparin Indication: atrial fibrillation  No Known Allergies  Patient Measurements: Height: 5\' 3"  (160 cm) Weight: 151 lb 14.4 oz (68.9 kg) IBW/kg (Calculated) : 52.4 Heparin Dosing Weight: 67 kg  Vital Signs: Temp: 98 F (36.7 C) (12/09 0557) Temp Source: Oral (12/09 0557) BP: 101/47 (12/09 0557) Pulse Rate: 82 (12/09 0557)  Labs: Recent Labs    10/21/17 0201 10/21/17 1129 10/22/17 0214 10/23/17 0219  HGB 9.0*  --   --  8.7*  HCT 26.6*  --   --  25.7*  PLT 173  --   --  132*  HEPARINUNFRC 0.48 0.53 0.55 0.53  CREATININE 5.04*  --  4.27* 4.15*    Estimated Creatinine Clearance: 9.6 mL/min (A) (by C-G formula based on SCr of 4.15 mg/dL (H)).  Assessment: 83 year old female admitted on 10/16/2017 with decreased urine output and worsening LE edema found to have acute obstructive uropathy and AKI and metastatic urothelial cancer with mets to liver. On 12/6, patient developed new atrial fibrillation with HR in 130s, CHADsVASc score 4.  Heparin level remains therapeutic (0.53) on 1000 units/hr. CBC shows low Hgb, but stable, plts ok. No bleeding reported. Patient's Scr is slowly improving, no indication for HD yet.   Goal of Therapy:  Heparin level 0.3-0.7 units/ml Monitor platelets by anticoagulation protocol: Yes   Plan:  Continue Heparin drip at 1000 units/hr Daily heparin level and CBC while on Heparin. Follow up anticoagulation plans  Leroy Libman, PharmD Pharmacy Resident Pager: [phone]

## 2017-10-23 NOTE — Progress Notes (Signed)
Glucose 61, treated with 1/2 amp D50. Pt remains asymptomatic. Oncoming RN, Raven, will monitor closely.

## 2017-10-23 NOTE — Progress Notes (Signed)
Glucose on AM labs noted to be 62. Pt asymptomatic. Obtained STAT accucheck (60). Hypoglycemic protocol implemented. Pt given 1 tube of glucose. Will monitor.

## 2017-10-24 LAB — RENAL FUNCTION PANEL
ANION GAP: 14 (ref 5–15)
Albumin: 1.7 g/dL — ABNORMAL LOW (ref 3.5–5.0)
BUN: 82 mg/dL — AB (ref 6–20)
CALCIUM: 7.7 mg/dL — AB (ref 8.9–10.3)
CO2: 19 mmol/L — AB (ref 22–32)
Chloride: 97 mmol/L — ABNORMAL LOW (ref 101–111)
Creatinine, Ser: 3.65 mg/dL — ABNORMAL HIGH (ref 0.44–1.00)
GFR calc Af Amer: 12 mL/min — ABNORMAL LOW (ref >60)
GFR calc non Af Amer: 11 mL/min — ABNORMAL LOW (ref >60)
GLUCOSE: 83 mg/dL (ref 65–99)
Phosphorus: 4.4 mg/dL (ref 2.5–4.6)
Potassium: 3.7 mmol/L (ref 3.5–5.1)
SODIUM: 130 mmol/L — AB (ref 135–145)

## 2017-10-24 LAB — CBC
HCT: 24.1 % — ABNORMAL LOW (ref 36.0–46.0)
HEMATOCRIT: 24.4 % — AB (ref 36.0–46.0)
HEMOGLOBIN: 8.2 g/dL — AB (ref 12.0–15.0)
HEMOGLOBIN: 8.2 g/dL — AB (ref 12.0–15.0)
MCH: 26.7 pg (ref 26.0–34.0)
MCH: 27 pg (ref 26.0–34.0)
MCHC: 33.6 g/dL (ref 30.0–36.0)
MCHC: 34 g/dL (ref 30.0–36.0)
MCV: 79.5 fL (ref 78.0–100.0)
MCV: 79.3 fL (ref 78.0–100.0)
PLATELETS: 138 10*3/uL — AB (ref 150–400)
Platelets: 136 10*3/uL — ABNORMAL LOW (ref 150–400)
RBC: 3.07 MIL/uL — ABNORMAL LOW (ref 3.87–5.11)
RBC: 3.04 MIL/uL — ABNORMAL LOW (ref 3.87–5.11)
RDW: 17.6 % — AB (ref 11.5–15.5)
RDW: 17.6 % — ABNORMAL HIGH (ref 11.5–15.5)
WBC: 9.5 10*3/uL (ref 4.0–10.5)
WBC: 9.4 10*3/uL (ref 4.0–10.5)

## 2017-10-24 LAB — TYPE AND SCREEN
ABO/RH(D): O POS
ANTIBODY SCREEN: NEGATIVE

## 2017-10-24 LAB — GLUCOSE, CAPILLARY
GLUCOSE-CAPILLARY: 75 mg/dL (ref 65–99)
Glucose-Capillary: 80 mg/dL (ref 65–99)

## 2017-10-24 LAB — ABO/RH: ABO/RH(D): O POS

## 2017-10-24 LAB — HEPARIN LEVEL (UNFRACTIONATED): HEPARIN UNFRACTIONATED: 0.5 [IU]/mL (ref 0.30–0.70)

## 2017-10-24 MED ORDER — MORPHINE SULFATE (PF) 2 MG/ML IV SOLN
1.0000 mg | Freq: Once | INTRAVENOUS | Status: AC
  Administered 2017-10-24: 1 mg via INTRAVENOUS
  Filled 2017-10-24: qty 1

## 2017-10-24 MED ORDER — SODIUM CHLORIDE 0.9 % IV BOLUS (SEPSIS)
250.0000 mL | Freq: Once | INTRAVENOUS | Status: AC
  Administered 2017-10-24: 250 mL via INTRAVENOUS

## 2017-10-24 NOTE — Progress Notes (Signed)
ANTICOAGULATION CONSULT NOTE - Follow Up Consult  Pharmacy Consult for Heparin Indication: atrial fibrillation  No Known Allergies  Patient Measurements: Height: 5\' 3"  (160 cm) Weight: 147 lb 11.3 oz (67 kg) IBW/kg (Calculated) : 52.4 Heparin Dosing Weight: 67 kg  Vital Signs: Temp: 97.6 F (36.4 C) (12/10 0603) Temp Source: Axillary (12/10 0603) BP: 98/40 (12/10 0603) Pulse Rate: 85 (12/10 0603)  Labs: Recent Labs    10/22/17 0214 10/23/17 0219 10/24/17 0200  HGB  --  8.7* 8.2*  HCT  --  25.7* 24.4*  PLT  --  132* 136*  HEPARINUNFRC 0.55 0.53 0.50  CREATININE 4.27* 4.15* 3.65*    Estimated Creatinine Clearance: 10.7 mL/min (A) (by C-G formula based on SCr of 3.65 mg/dL (H)).  Assessment: 83 year old female admitted on 10/16/2017 with decreased urine output and worsening LE edema found to have acute obstructive uropathy and AKI and metastatic urothelial cancer with mets to liver. On 12/6, patient developed new atrial fibrillation with HR in 130s, CHADsVASc score 4 (age, female, HTN).  Heparin level remains therapeutic (0.5) on 1000 units/hr. CBC shows low Hgb, but stable, platelets are trending down. Maroon loose stool noted this morning. Repeat CBC pending.  Goal of Therapy:  Heparin level 0.3-0.7 units/ml Monitor platelets by anticoagulation protocol: Yes   Plan:  Continue heparin drip at 1000 units/hr Follow up repeat CBC Daily heparin level and CBC while on Heparin Monitor for s/sx of bleeding Follow up long term anticoagulation plans   Renold Genta, PharmD, BCPS Clinical Pharmacist Phone for today - [place] - [phone] 10/24/2017 8:21 AM    Addendum: Repeat CBC shows Hgb is stable, platelets remain low. Continue with plan above.  Renold Genta, PharmD, BCPS Clinical Pharmacist 10/24/2017 8:57 AM

## 2017-10-24 NOTE — Progress Notes (Signed)
Patient ID: Sharon [name], female   DOB: 02/16/1932, 83 y.o.   MRN: [id number] S: Pt is moaning and groaning in bed, not opening her eyes O:BP (!) 81/53   Pulse 89   Temp 97.6 F (36.4 C) (Axillary)   Resp 12   Ht 5\' 3"  (1.6 m)   Wt 67 kg (147 lb 11.3 oz)   SpO2 93%   BMI 26.17 kg/m   Intake/Output Summary (Last 24 hours) at 10/24/2017 1232 Last data filed at 10/24/2017 1047 Gross per 24 hour  Intake 218.17 ml  Output 500 ml  Net -281.83 ml   Intake/Output: I/O last 3 completed shifts: In: 965.8 [P.O.:360; I.V.:605.8] Out: 800 [Urine:800]  Intake/Output this shift:  Total I/O In: 100 [P.O.:100] Out: -  Weight change: -1.9 kg (-3 oz) DDU:KGURK, chronically ill-appearing, debilitated WF CVS:no rub Resp:occ rhonchi Abd:+BS Ext:+ trace edema  Recent Labs  Lab 10/18/17 0301 10/19/17 0642 10/20/17 0427 10/21/17 0201 10/22/17 0214 10/23/17 0219 10/24/17 0200  NA 123* 127* 130* 128* 130* 131* 130*  K 4.6 4.4 3.6 3.7 3.7 3.9 3.7  CL 89* 93* 96* 95* 97* 98* 97*  CO2 14* 15* 19* 17* 18* 17* 19*  GLUCOSE 85 84 87 81 81 62* 83  BUN 100* 93* 83* 83* 75* 79* 82*  CREATININE 8.63* 7.18* 5.97* 5.04* 4.27* 4.15* 3.65*  ALBUMIN  --   --   --  1.8* 1.7* 1.8* 1.7*  CALCIUM 6.6* 7.6* 8.0* 8.0* 8.0* 7.9* 7.7*  PHOS  --   --   --  4.9* 4.6 4.8* 4.4   Liver Function Tests: Recent Labs  Lab 10/22/17 0214 10/23/17 0219 10/24/17 0200  ALBUMIN 1.7* 1.8* 1.7*   No results for input(s): LIPASE, AMYLASE in the last 168 hours. No results for input(s): AMMONIA in the last 168 hours. CBC: Recent Labs  Lab 10/18/17 0301 10/19/17 0642 10/21/17 0201 10/23/17 0219 10/24/17 0200 10/24/17 0747  WBC 9.0 8.4 9.7 9.6 9.5 9.4  NEUTROABS 7.7 6.9  --   --   --   --   HGB 9.0* 8.9* 9.0* 8.7* 8.2* 8.2*  HCT 25.9* 26.4* 26.6* 25.7* 24.4* 24.1*  MCV 76.9* 77.4* 78.2 79.1 79.5 79.3  PLT 212 210 173 132* 136* 138*   Cardiac Enzymes: No results for input(s): CKTOTAL, CKMB, CKMBINDEX,  TROPONINI in the last 168 hours. CBG: Recent Labs  Lab 10/23/17 0816 10/23/17 1147 10/23/17 1714 10/24/17 0745 10/24/17 0900  GLUCAP 140* 108* 75 75 80    Iron Studies: No results for input(s): IRON, TIBC, TRANSFERRIN, FERRITIN in the last 72 hours. Studies/Results: No results found. . calcium-vitamin D  1 tablet Oral BID  . cholecalciferol  1,000 Units Oral Daily  . ferrous gluconate  324 mg Oral Q breakfast  . hydrocortisone   Rectal BID  . levothyroxine  25 mcg Oral QAC breakfast  . metoprolol tartrate  12.5 mg Oral BID  . multivitamin with minerals  1 tablet Oral Daily  . nystatin  5 mL Mouth/Throat QID  . vitamin B-12  1,000 mcg Oral Daily    BMET    Component Value Date/Time   NA 130 (L) 10/24/2017 0200   K 3.7 10/24/2017 0200   CL 97 (L) 10/24/2017 0200   CO2 19 (L) 10/24/2017 0200   GLUCOSE 83 10/24/2017 0200   BUN 82 (H) 10/24/2017 0200   CREATININE 3.65 (H) 10/24/2017 0200   CALCIUM 7.7 (L) 10/24/2017 0200   GFRNONAA 11 (L) 10/24/2017 0200  GFRAA 12 (L) 10/24/2017 0200   CBC    Component Value Date/Time   WBC 9.4 10/24/2017 0747   RBC 3.04 (L) 10/24/2017 0747   HGB 8.2 (L) 10/24/2017 0747   HCT 24.1 (L) 10/24/2017 0747   PLT 138 (L) 10/24/2017 0747   MCV 79.3 10/24/2017 0747   MCH 27.0 10/24/2017 0747   MCHC 34.0 10/24/2017 0747   RDW 17.6 (H) 10/24/2017 0747   LYMPHSABS 1.1 10/19/2017 0642   MONOABS 0.5 10/19/2017 0642   EOSABS 0.0 10/19/2017 0642   BASOSABS 0.0 10/19/2017 0642     Assessment/Plan:  1. AKICKD- due to obstructive uropathy.  S/p replacement of right PNT and marked improvement of renal function.  Scr peaked at 9.15 and now down to 3.65.  Baseline Scr 1.2-1.5.  Nothing further to add with improving renal function and fact that she is not a dialysis candidate.  Will sign off.  Call with any questions or concerns 2. Metastatic urothelial cancer- surgical path metastatic non-small cell carcinoma. 3. Hyponatremia-  stable/improved 4. Volume overload- improving with replacement of right PNT 5. Anemia- transfuse prn 6. Disposition- poor overall prognosis and recommend palliative care consult to help set goal/limits of care.    Donetta Potts, MD [employer] [phone]

## 2017-10-24 NOTE — Progress Notes (Signed)
Occupational Therapy Treatment Patient Details Name: Sharon [name] MRN: [id number] DOB: [birth year] Today's Date: 10/24/2017    History of present illness Patient is an 83 y/o female admitted to hospital with the working diagnosis acute obstructive uropathy, complicated with uremia, metabolic acidosis, hyperkalemia and hyponatremia.  Patient does have significant past history for hypertension, hypothyroidism, iron deficiency anemia and recently diagnosed metastatic urothelial cancer, status post bilateral ureter stent placements due to obstructive uropathy.  R percutaneous nephrostomy placed 12/3.   OT comments  Pt demonstrating decreased functional performance and awareness. Upon arrival, pt supine in bed and with incontinence (bloody stool; RN present and notified). Pt requiring Max A+2 for sit<>stand and performance of toilet hygiene. Pt fatigues quickly and required to return to bed level to complete toilet hygiene. Pt requiring Max-total A for cleaning bottom and peri area; pt highly distracted by generalized pain. Pending pt's progress, she may need SNF rehab to increase safety and independence with ADLs prior to returning home. Will continue to follow acutely to facilitate safe dc and increase occupational performance and participation.    Follow Up Recommendations  Home health OT;Supervision/Assistance - 24 hour;Other (comment)(Possible need for SNF placement)    Equipment Recommendations  None recommended by OT    Recommendations for Other Services      Precautions / Restrictions Precautions Precautions: Fall Precaution Comments: R nephrostomy Restrictions Weight Bearing Restrictions: No       Mobility Bed Mobility Overal bed mobility: Needs Assistance Bed Mobility: Supine to Sit;Sit to Supine;Rolling Rolling: Max assist;+2 for safety/equipment   Supine to sit: Max assist;HOB elevated Sit to supine: Max assist   General bed mobility comments: Pt requiring Max A to  bringBLEs towards EOB and elevate trunk. Pt reuqired Max A to return to supine. Max A to roll side to side during toilet hygiene at bed level.  Transfers Overall transfer level: Needs assistance Equipment used: Rolling walker (2 wheeled) Transfers: Sit to/from [facility] Sit to Stand: Mod assist;Max assist         General transfer comment: Required increased time nad cues for sit<>stand. Mod A to power into standing and then Max A to maintain standing     Balance Overall balance assessment: Needs assistance Sitting-balance support: No upper extremity supported Sitting balance-Leahy Scale: Fair Sitting balance - Comments: Pt able to maintain static sitting EOB, however significant forward trunk flexion due to fatigue, required cues to maintain upright position    Standing balance support: Bilateral upper extremity supported Standing balance-Leahy Scale: Poor Standing balance comment: Required UE support throughout session                            ADL either performed or assessed with clinical judgement   ADL Overall ADL's : Needs assistance/impaired             Lower Body Bathing: Total assistance;Bed level;Sit to/from stand   Upper Body Dressing : Maximal assistance Upper Body Dressing Details (indicate cue type and reason): Max A to don new gown at bed level Lower Body Dressing: Total assistance;Bed level Lower Body Dressing Details (indicate cue type and reason): doffing socks in bed.      Toileting- Clothing Manipulation and Hygiene: Maximal assistance;+2 for physical assistance;Bed level;Sit to/from stand;Total assistance Toileting - Clothing Manipulation Details (indicate cue type and reason): Pt requiring max A to perform toilet hygiene after incontience in bed. Pt with bloody stool; RN present and notified. Pt performing one  sit<>stand with Max A for standing balance and Max A to clean bottom. Pt then transitioning to bed due to fatigue  and required Max-total A +2 for cleaning and peri care.      Functional mobility during ADLs: Moderate assistance;Rolling walker;Maximal assistance(sit<>stand only) General ADL Comments: Pt with significant pain and lethargy. Pt unable to tolerance toilet hygiene and required increased assistance as session progessed. Pt not answering questions such as "what is your name?" "Do you go by [name]?" "What is your husband's name?"     Vision       Perception     Praxis      Cognition Arousal/Alertness: Awake/alert Behavior During Therapy: Flat affect;Anxious Overall Cognitive Status: Impaired/Different from baseline Area of Impairment: Attention;Following commands;Safety/judgement;Awareness;Problem solving                   Current Attention Level: Sustained   Following Commands: Follows one step commands inconsistently;Follows one step commands with increased time Safety/Judgement: Decreased awareness of safety Awareness: Emergent Problem Solving: Slow processing;Requires verbal cues;Requires tactile cues General Comments: Distracted highly by pain. Pt only responding to questions with moans and "it hurts"        Exercises     Shoulder Instructions       General Comments Husband present for begining and end of session; stepping out of pt's room during BM clean up    Pertinent Vitals/ Pain       Pain Assessment: Faces Faces Pain Scale: Hurts whole lot Pain Location: "All over" Pain Descriptors / Indicators: Tender;Sore Pain Intervention(s): Limited activity within patient's tolerance;Monitored during session;Repositioned  Home Living                                          Prior Functioning/Environment              Frequency  Min 2X/week        Progress Toward Goals  OT Goals(current goals can now be found in the care plan section)  Progress towards OT goals: Not progressing toward goals - comment(Very lethargic and decreased  occupational participation)  Acute Rehab OT Goals Patient Stated Goal: To return home with assist OT Goal Formulation: With patient/family Time For Goal Achievement: 11/02/17 Potential to Achieve Goals: Good ADL Goals Pt Will Perform Grooming: with supervision;sitting Pt Will Perform Upper Body Bathing: with min guard assist;with caregiver independent in assisting;with adaptive equipment;sitting Pt Will Perform Lower Body Bathing: with min guard assist;with caregiver independent in assisting;with adaptive equipment;sitting/lateral leans Pt Will Transfer to Toilet: with mod assist;stand pivot transfer;bedside commode Pt Will Perform Toileting - Clothing Manipulation and hygiene: with mod assist;sit to/from stand;with caregiver independent in assisting Pt/caregiver will Perform Home Exercise Program: Increased strength;Both right and left upper extremity;With theraband;Independently;With written HEP provided Additional ADL Goal #1: Pt will perform bed mobility at min guard assist level prior to initiating ADL activity  Plan Discharge plan remains appropriate;Other (comment)(DC may need to be updated pending next session)    Co-evaluation                 AM-PAC PT "6 Clicks" Daily Activity     Outcome Measure   Help from another person eating meals?: None Help from another person taking care of personal grooming?: A Little Help from another person toileting, which includes using toliet, bedpan, or urinal?: A Lot Help from another person bathing (including  washing, rinsing, drying)?: A Lot Help from another person to put on and taking off regular upper body clothing?: A Lot Help from another person to put on and taking off regular lower body clothing?: A Lot 6 Click Score: 15    End of Session Equipment Utilized During Treatment: Gait belt;Rolling walker  OT Visit Diagnosis: Muscle weakness (generalized) (M62.81);Unsteadiness on feet (R26.81);Other abnormalities of gait and  mobility (R26.89)   Activity Tolerance Patient limited by fatigue;Patient limited by lethargy;Patient limited by pain   Patient Left with call bell/phone within reach;in bed;with bed alarm set;with nursing/sitter in room;with family/visitor present   Nurse Communication Mobility status;Other (comment)(Bloody stool)        Time: 4827-0786 OT Time Calculation (min): 34 min  Charges: OT General Charges $OT Visit: 1 Visit OT Treatments $Self Care/Home Management : 23-37 mins  [place], OTR/L Acute Rehab Pager: [phone] Office: [place] 10/24/2017, 10:57 AM

## 2017-10-24 NOTE — Progress Notes (Signed)
Called MD to report loose maroon stool in am.  Will repeat CBC in three hrs and pass along to day shift to be aware.

## 2017-10-24 NOTE — Progress Notes (Signed)
PROGRESS NOTE    Sharon [name]  GDJ:[id number] DOB: Dec 17, 1931 DOA: 11/03/2017 PCP: Ann Held, DO    Brief Narrative:  83 year old female who presented with decreased urine output. Patient does haveasignificant pastmedicalhistory for hypertension, hypothyroidism, iron deficiency anemiaandrecently diagnosed metastatic urothelial cancer,status post bilateral ureter stent placements due to obstructive uropathy(discharged 1 week prior to hospitalization).Patient was noted to have a drastic decrease in urine output, associated with worsening lower extremity edema.Her blood pressure was 142/52,heart rate 73, respiratory rate 18, temperature 98.3,oxygen saturations 95%.Moist mucous membranes, lungs clear to auscultation bilaterally, heart S1-S2 present rhythmic,abdomen soft,3+ pitting lowerextremityedema.Sodium 119, potassium 6.0, chloride 84, bicarbonate 16, glucose 125, BUN95, creatinine 8.39,calcium 6.2,white cell count 13.7, hemoglobin 9.9, hematocrit 27.7, platelets253.Urinalysis with too numerous, RBCs, and too numerous to count white cells.Chest x-ray with right rotation, bilateral pleural effusions more rightthanleft.CT of the abdomen with multiple lesions throughout the liver most suggestive of metastatic disease, extensive bony metastatic disease in the spine and pelvis, mild bilateral hydronephrosis right greater than left, large bilateral pleural effusions.  Patient was admitted tothehospital with the working diagnosis acute obstructive uropathy, complicated with uremia, metabolic acidosis,hyperkalemia and hyponatremia.  Assessment & Plan:   Principal Problem:   Uremia Active Problems:   Hypothyroidism   Essential hypertension   Iron deficiency anemia   Hyponatremia   Hyperkalemia   Metabolic acidosis   Hypocalcemia   Urothelial cancer ([place])   Acute renal failure ([place])   Palliative care by specialist   Goals of care,  counseling/discussion   1.Acute obstructive uropathy with acute kidney injurywith hyponatremia, hyperkalemia and metabolic acidosis.Urine output over last 24 hours at 500, patient has been hypotensive, this am, will give bolus of ns 250 x 1, will target MAP greater than 60. Renal function continue to improve with serum cr down to 3,65 with serum K at 3,7 and bicarbonate at 19. Will follow renal panel in am.   2. New onset atrial fibrillation with rapid ventricular response. Patient has remained in sinus rhythm after one dose of IV digoxin, will continue metoprolol for rate control HR 80's. Patient with positive GI bleed will hold on heparin drip or antiplatelet therapy for now. Will repeat ekg today to document sinus rhythm.   2.Metastatic urothelial cancerbilateral ureteral stents obstructed, sp right nephrostomy tube.Outpatientfollow up withOncology and Urology. Will need larger stents placement as outpatient.  3.Hypertension.Continue low dose of metoprolol, patient wit hypotension this am, will bolis ns at 250 ml x 1 and will continue close monitoring. At home patient on furosemide, lisinopril and Hctz.   4.Hypothyroidism.Continuelevothyroxine, with good toleration.  5.Iron deficiency anemia.  Hb and hct have remained stable with hb at 8,2 and hct at 24. Will hold on heparin and will check cell count in am.   DVT prophylaxis:heparin Code Status:full Family Communication:No family at beside.  Disposition Plan:home   Consultants:  IR  Nephrology  Procedures:  IR right percutaneous nephrostomy tube.  Antimicrobials:    Subjective: Patient feeling very weak and deconditioned, very poor oral intake, no nausea or vomiting. Positive bloody stools and low blood pressure.   Objective: Vitals:   10/23/17 1426 10/23/17 1725 10/23/17 2032 10/24/17 0603  BP: (!) 95/39 (!) 107/42 (!) 110/48 (!) 98/40  Pulse: 83 83 87 85  Resp: 18  16 12   Temp: (!)  97.5 F (36.4 C)  98.4 F (36.9 C) 97.6 F (36.4 C)  TempSrc:   Oral Axillary  SpO2: 99%  96% 93%  Weight:    67  kg (147 lb 11.3 oz)  Height:        Intake/Output Summary (Last 24 hours) at 10/24/2017 0947 Last data filed at 10/23/2017 2300 Gross per 24 hour  Intake 478.17 ml  Output 500 ml  Net -21.83 ml   Filed Weights   10/22/17 0500 10/23/17 0557 10/24/17 0603  Weight: 68.9 kg (151 lb 14.4 oz) 68.9 kg (151 lb 14.4 oz) 67 kg (147 lb 11.3 oz)    Examination:   General: deconditioned and ill looking appearing Neurology: Somnolent.,  E ENT: mild pallor, no icterus, oral mucosa moist Cardiovascular: No JVD. S1-S2 present, rhythmic, no gallops, rubs, or murmurs. +++ pitting lower extremity edema. Pulmonary: decreased breath sounds bilaterally, with poor air movement, no wheezing, rhonchi or rales. Gastrointestinal. Abdomen mild distention, no organomegaly, non tender, no rebound or guarding Skin. No rashes Musculoskeletal: no joint deformities     Data Reviewed: I have personally reviewed following labs and imaging studies  CBC: Recent Labs  Lab 10/18/17 0301 10/19/17 0642 10/21/17 0201 10/23/17 0219 10/24/17 0200 10/24/17 0747  WBC 9.0 8.4 9.7 9.6 9.5 9.4  NEUTROABS 7.7 6.9  --   --   --   --   HGB 9.0* 8.9* 9.0* 8.7* 8.2* 8.2*  HCT 25.9* 26.4* 26.6* 25.7* 24.4* 24.1*  MCV 76.9* 77.4* 78.2 79.1 79.5 79.3  PLT 212 210 173 132* 136* 478*   Basic Metabolic Panel: Recent Labs  Lab 10/20/17 0427 10/21/17 0201 10/22/17 0214 10/23/17 0219 10/24/17 0200  NA 130* 128* 130* 131* 130*  K 3.6 3.7 3.7 3.9 3.7  CL 96* 95* 97* 98* 97*  CO2 19* 17* 18* 17* 19*  GLUCOSE 87 81 81 62* 83  BUN 83* 83* 75* 79* 82*  CREATININE 5.97* 5.04* 4.27* 4.15* 3.65*  CALCIUM 8.0* 8.0* 8.0* 7.9* 7.7*  PHOS  --  4.9* 4.6 4.8* 4.4   GFR: Estimated Creatinine Clearance: 10.7 mL/min (A) (by C-G formula based on SCr of 3.65 mg/dL (H)). Liver Function Tests: Recent Labs  Lab  10/21/17 0201 10/22/17 0214 10/23/17 0219 10/24/17 0200  ALBUMIN 1.8* 1.7* 1.8* 1.7*   No results for input(s): LIPASE, AMYLASE in the last 168 hours. No results for input(s): AMMONIA in the last 168 hours. Coagulation Profile: Recent Labs  Lab 10/17/17 1409  INR 1.28   Cardiac Enzymes: No results for input(s): CKTOTAL, CKMB, CKMBINDEX, TROPONINI in the last 168 hours. BNP (last 3 results) No results for input(s): PROBNP in the last 8760 hours. HbA1C: No results for input(s): HGBA1C in the last 72 hours. CBG: Recent Labs  Lab 10/23/17 0816 10/23/17 1147 10/23/17 1714 10/24/17 0745 10/24/17 0900  GLUCAP 140* 108* 75 75 80   Lipid Profile: No results for input(s): CHOL, HDL, LDLCALC, TRIG, CHOLHDL, LDLDIRECT in the last 72 hours. Thyroid Function Tests: No results for input(s): TSH, T4TOTAL, FREET4, T3FREE, THYROIDAB in the last 72 hours. Anemia Panel: No results for input(s): VITAMINB12, FOLATE, FERRITIN, TIBC, IRON, RETICCTPCT in the last 72 hours.    Radiology Studies: I have reviewed all of the imaging during this hospital visit personally     Scheduled Meds: . calcium-vitamin D  1 tablet Oral BID  . cholecalciferol  1,000 Units Oral Daily  . ferrous gluconate  324 mg Oral Q breakfast  . hydrocortisone   Rectal BID  . levothyroxine  25 mcg Oral QAC breakfast  . metoprolol tartrate  12.5 mg Oral BID  . multivitamin with minerals  1 tablet Oral Daily  .  nystatin  5 mL Mouth/Throat QID  . vitamin B-12  1,000 mcg Oral Daily   Continuous Infusions: . heparin 1,000 Units/hr (10/23/17 1422)     LOS: 9 days        Deserae Jennings Gerome Apley, MD [facility] Pager [phone]

## 2017-10-24 NOTE — Progress Notes (Signed)
Subjective: Sharon [name] has had continued improvement in her Cr with right nephrostomy drainage.  Her husband was in the room with her today and states that she spends most of her time sleeping.   She was sleeping when I was in the room.    ROS:  Review of Systems  Unable to perform ROS: Other    Anti-infectives: Anti-infectives (From admission, onward)   Start     Dose/Rate Route Frequency Ordered Stop   10/17/17 1600  ciprofloxacin (CIPRO) IVPB 400 mg     400 mg 200 mL/hr over 60 Minutes Intravenous To Radiology 10/17/17 1507 10/17/17 1840   10/17/17 1525  ciprofloxacin (CIPRO) 400 MG/200ML IVPB    Comments:  [name]   : cabinet override      10/17/17 1525 10/18/17 0344      Current Facility-Administered Medications  Medication Dose Route Frequency Provider Last Rate Last Dose  . acetaminophen (TYLENOL) tablet 650 mg  650 mg Oral Q6H PRN Ivor Costa, MD   650 mg at 10/24/17 1059  . calcium-vitamin D (OSCAL WITH D) 500-200 MG-UNIT per tablet 1 tablet  1 tablet Oral BID Ivor Costa, MD   1 tablet at 10/24/17 0941  . cholecalciferol (VITAMIN D) tablet 1,000 Units  1,000 Units Oral Daily Ivor Costa, MD   1,000 Units at 10/24/17 0941  . cyclobenzaprine (FLEXERIL) tablet 5 mg  5 mg Oral TID PRN Ivor Costa, MD      . ferrous gluconate [facility]) tablet 324 mg  324 mg Oral Q breakfast Rai, Ripudeep K, MD   324 mg at 10/24/17 0941  . hydrocortisone (ANUSOL-HC) 2.5 % rectal cream   Rectal BID Ivor Costa, MD      . levothyroxine (SYNTHROID, LEVOTHROID) tablet 25 mcg  25 mcg Oral QAC breakfast Ivor Costa, MD   25 mcg at 10/24/17 0940  . metoprolol tartrate (LOPRESSOR) injection 5 mg  5 mg Intravenous Q4H PRN Arrien, Jimmy Picket, MD      . metoprolol tartrate (LOPRESSOR) tablet 12.5 mg  12.5 mg Oral BID Arrien, Jimmy Picket, MD   Stopped at 10/22/17 1811  . multivitamin with minerals tablet 1 tablet  1 tablet Oral Daily Ivor Costa, MD   1 tablet at 10/24/17 0941  . nystatin  (MYCOSTATIN) 100000 UNIT/ML suspension 500,000 Units  5 mL Mouth/Throat QID Vianne Bulls, MD   500,000 Units at 10/24/17 [phone]  . ondansetron (ZOFRAN) injection 4 mg  4 mg Intravenous Q8H PRN Ivor Costa, MD      . polyethylene glycol (MIRALAX / GLYCOLAX) packet 17 g  17 g Oral Daily PRN Ivor Costa, MD      . vitamin B-12 (CYANOCOBALAMIN) tablet 1,000 mcg  1,000 mcg Oral Daily Ivor Costa, MD   1,000 mcg at 10/24/17 1100  . zolpidem (AMBIEN) tablet 5 mg  5 mg Oral QHS PRN Ivor Costa, MD         Objective: Vital signs in last 24 hours: Temp:  [97.5 F (36.4 C)-98.4 F (36.9 C)] 97.6 F (36.4 C) (12/10 0603) Pulse Rate:  [83-89] 89 (12/10 1108) Resp:  [12-18] 12 (12/10 0603) BP: (81-110)/(39-53) 81/53 (12/10 1108) SpO2:  [93 %-99 %] 93 % (12/10 0603) Weight:  [67 kg (147 lb 11.3 oz)] 67 kg (147 lb 11.3 oz) (12/10 0603)  Intake/Output from previous day: 12/09 0701 - 12/10 0700 In: 478.2 [P.O.:360; I.V.:118.2] Out: 500 [Urine:500] Intake/Output this shift: Total I/O In: 100 [P.O.:100] Out: -    Physical Exam  Constitutional:  Elderly, ill-appearing WF sleeping comfortably.  Skin:  She has a slightly jaundiced appearance.   Vitals reviewed.   Lab Results:  Recent Labs    10/24/17 0200 10/24/17 0747  WBC 9.5 9.4  HGB 8.2* 8.2*  HCT 24.4* 24.1*  PLT 136* 138*   BMET Recent Labs    10/23/17 0219 10/24/17 0200  NA 131* 130*  K 3.9 3.7  CL 98* 97*  CO2 17* 19*  GLUCOSE 62* 83  BUN 79* 82*  CREATININE 4.15* 3.65*  CALCIUM 7.9* 7.7*   PT/INR No results for input(s): LABPROT, INR in the last 72 hours. ABG No results for input(s): PHART, HCO3 in the last 72 hours.  Invalid input(s): PCO2, PO2  Studies/Results: No results found.   Assessment and Plan: Bilateral ureteral obstruction with metastatic carcinoma possibly of urothelial origin.  She has obstructed ureteral stents and had a right NT placed with a gradual decline in the Cr. She could potentially have  exchange of her ureteral stents for a larger caliber which would allow removal of the NT.   Please let me know if her medical condition would merit a trip to the OR and sedation for the procedure.  I have spoken to her husband about this.   Hepatic metastases.  Her color remains somewhat yellow.   She had elevated ALT, AST alk phos on 10/05/17 but her bili was normal.  I don't see that she has had LFT's at this admission and that might be worthwhile particularly considered her excessive somnolence.       LOS: 9 days    Irine Seal 10/24/2017 [phone] ID: Sharon [name], female   DOB: 1932-02-04, 83 y.o.   MRN: [id number]

## 2017-10-24 NOTE — Progress Notes (Signed)
Physical Therapy Treatment Patient Details Name: Sharon [name] MRN: [id number] DOB: November 06, 1932 Today's Date: 10/24/2017    History of Present Illness Patient is an 83 y/o female admitted to hospital with the working diagnosis acute obstructive uropathy, complicated with uremia, metabolic acidosis, hyperkalemia and hyponatremia.  Patient does have significant past history for hypertension, hypothyroidism, iron deficiency anemia and recently diagnosed metastatic urothelial cancer, status post bilateral ureter stent placements due to obstructive uropathy.  R percutaneous nephrostomy placed 12/3.    PT Comments    Pt is extremely lethargic during session today and needed maximal cuing to stay aroused. During today's session pt was maxA for bed mobility, modA for transfers and sidestepping 3 feet toward HoB. PT will reassess pt's abilities at next session to determine if change in d/c plan is needed. PT will continue to follow acutely.    Follow Up Recommendations  Home health PT;Supervision/Assistance - 24 hour(HH aide)     Equipment Recommendations  None recommended by PT       Precautions / Restrictions Precautions Precautions: Fall Precaution Comments: R nephrostomy Restrictions Weight Bearing Restrictions: No    Mobility  Bed Mobility Overal bed mobility: Needs Assistance Bed Mobility: Supine to Sit;Sit to Supine;Rolling Rolling: Max assist;+2 for safety/equipment   Supine to sit: Max assist;HOB elevated Sit to supine: Max assist   General bed mobility comments: Max A to bring pt EoB, Pt sat EoB for 5 minutes with min A. maxA for managing LE into bed and trunk to center of bed. maxAx2 for rolling for pericare   Transfers Overall transfer level: Needs assistance Equipment used: 1 person hand held assist Transfers: Sit to/from [facility] Sit to Stand: Mod assist         General transfer comment: modA for power up and steadying,vc for forward lean  and pushing through her legs to come all the way to upright  Ambulation/Gait Ambulation/Gait assistance: Mod assist Ambulation Distance (Feet): 3 Feet Assistive device: Rolling walker (2 wheeled);1 person hand held assist Gait Pattern/deviations: Trunk flexed;Wide base of support;Shuffle;Step-to pattern Gait velocity: decreased Gait velocity interpretation: Below normal speed for age/gender General Gait Details: modA for sides tepping to head of bed, vc for sequencing and upright posture      Balance Overall balance assessment: Needs assistance Sitting-balance support: No upper extremity supported Sitting balance-Leahy Scale: Poor Sitting balance - Comments: required minA for maintiaining balance EoB   Standing balance support: Bilateral upper extremity supported Standing balance-Leahy Scale: Poor Standing balance comment: Required UE support throughout session                             Cognition Arousal/Alertness: Awake/alert Behavior During Therapy: WFL for tasks assessed/performed Overall Cognitive Status: Within Functional Limits for tasks assessed Area of Impairment: Attention;Following commands;Safety/judgement                   Current Attention Level: Sustained   Following Commands: Follows one step commands inconsistently Safety/Judgement: Decreased awareness of safety Awareness: Emergent Problem Solving: Slow processing;Requires verbal cues;Requires tactile cues General Comments: Fell asleep multiple times during session       Exercises General Exercises - Lower Extremity Ankle Circles/Pumps: AAROM;Both;10 reps;Supine Heel Slides: AAROM;Both;10 reps;Supine Hip ABduction/ADduction: AAROM;Both;10 reps;Supine Straight Leg Raises: AAROM;Both;10 reps;Supine    General Comments General comments (skin integrity, edema, etc.): Husband present at beginning of session but stepped out once pt started working with PT  Pertinent Vitals/Pain Pain  Assessment: Faces Faces Pain Scale: Hurts even more Pain Location: "All over" Pain Descriptors / Indicators: Tender;Sore Pain Intervention(s): Limited activity within patient's tolerance;Monitored during session           PT Goals (current goals can now be found in the care plan section) Acute Rehab PT Goals Patient Stated Goal: To return home with assist PT Goal Formulation: With patient/family Time For Goal Achievement: 11/02/17 Potential to Achieve Goals: Good Progress towards PT goals: Not progressing toward goals - comment(Pt extremely lethargic today and difficult to keep aroused)    Frequency    Min 3X/week      PT Plan Current plan remains appropriate       AM-PAC PT "6 Clicks" Daily Activity  Outcome Measure  Difficulty turning over in bed (including adjusting bedclothes, sheets and blankets)?: A Lot Difficulty moving from lying on back to sitting on the side of the bed? : Unable Difficulty sitting down on and standing up from a chair with arms (e.g., wheelchair, bedside commode, etc,.)?: Unable Help needed moving to and from a bed to chair (including a wheelchair)?: A Lot Help needed walking in hospital room?: A Lot Help needed climbing 3-5 steps with a railing? : Total 6 Click Score: 9    End of Session Equipment Utilized During Treatment: Gait belt Activity Tolerance: Patient limited by fatigue Patient left: in chair;with family/visitor present;with call bell/phone within reach Nurse Communication: Mobility status PT Visit Diagnosis: Difficulty in walking, not elsewhere classified (R26.2);Muscle weakness (generalized) (M62.81);Unsteadiness on feet (R26.81)     Time: 7026-3785 PT Time Calculation (min) (ACUTE ONLY): 30 min  Charges:  $Therapeutic Exercise: 8-22 mins $Therapeutic Activity: 8-22 mins                    G Codes:       Zackariah Vanderpol B. Migdalia Dk PT, DPT Acute Rehabilitation  [phone] Pager [phone]     [place] 10/24/2017, 4:06 PM

## 2017-10-25 DIAGNOSIS — I959 Hypotension, unspecified: Secondary | ICD-10-CM

## 2017-10-25 LAB — RENAL FUNCTION PANEL
Albumin: 1.9 g/dL — ABNORMAL LOW (ref 3.5–5.0)
Anion gap: 16 — ABNORMAL HIGH (ref 5–15)
BUN: 81 mg/dL — ABNORMAL HIGH (ref 6–20)
CHLORIDE: 99 mmol/L — AB (ref 101–111)
CO2: 16 mmol/L — ABNORMAL LOW (ref 22–32)
CREATININE: 3.34 mg/dL — AB (ref 0.44–1.00)
Calcium: 8.1 mg/dL — ABNORMAL LOW (ref 8.9–10.3)
GFR calc non Af Amer: 12 mL/min — ABNORMAL LOW (ref >60)
GFR, EST AFRICAN AMERICAN: 14 mL/min — AB (ref >60)
Glucose, Bld: 69 mg/dL (ref 65–99)
Phosphorus: 4.3 mg/dL (ref 2.5–4.6)
Potassium: 3.8 mmol/L (ref 3.5–5.1)
Sodium: 131 mmol/L — ABNORMAL LOW (ref 135–145)

## 2017-10-25 LAB — CBC
HCT: 27.6 % — ABNORMAL LOW (ref 36.0–46.0)
Hemoglobin: 9 g/dL — ABNORMAL LOW (ref 12.0–15.0)
MCH: 26.5 pg (ref 26.0–34.0)
MCHC: 32.6 g/dL (ref 30.0–36.0)
MCV: 81.2 fL (ref 78.0–100.0)
PLATELETS: 123 10*3/uL — AB (ref 150–400)
RBC: 3.4 MIL/uL — AB (ref 3.87–5.11)
RDW: 18.5 % — ABNORMAL HIGH (ref 11.5–15.5)
WBC: 11.5 10*3/uL — AB (ref 4.0–10.5)

## 2017-10-25 LAB — GLUCOSE, CAPILLARY: GLUCOSE-CAPILLARY: 77 mg/dL (ref 65–99)

## 2017-10-25 MED ORDER — SODIUM CHLORIDE 0.9 % IV BOLUS (SEPSIS)
250.0000 mL | Freq: Once | INTRAVENOUS | Status: AC
  Administered 2017-10-25: 250 mL via INTRAVENOUS

## 2017-10-25 NOTE — Clinical Social Work Note (Signed)
Clinical Social Work Assessment  Patient Details  Name: Sharon [name] MRN: [id number] Date of Birth: November 09, 1932  Date of referral:  10/25/17               Reason for consult:  Facility Placement                Permission sought to share information with:  Family Supports Permission granted to share information::     Name::     Stage manager::  SNF  Relationship::  son  Contact Information:     Housing/Transportation Living arrangements for the past 2 months:  [place] of Information:  Adult Children Patient Interpreter Needed:  None Criminal Activity/Legal Involvement Pertinent to Current Situation/Hospitalization:  No - Comment as needed Significant Relationships:  Adult Children Lives with:  Spouse Do you feel safe going back to the place where you live?  No Need for family participation in patient care:  Yes (Comment)(help with caregiving/decision making)  Care giving concerns:  Pt recently moved back to this area from [facility] were she was staying with her husband- moved back to be near sons after new diagnosis of cancer.  Per pt son patient is normally the caregiver for the spouse so he knows they can't manage at home in current condition.     Social Worker assessment / plan:  CSW spoke with pt son concerning PT recommendation for SNF.  Explained SNF and SNF referral process.  CSW also discussed what pt son needs to do to help place pt spouse from home.  Employment status:  Retired Forensic scientist:  Medicare PT Recommendations:  [place] / Referral to community resources:  [place]  Patient/Family's Response to care:  Agreeable to SNF- [facility] for [facility] where pt son is already placed for LTC.  Patient/Family's Understanding of and Emotional Response to Diagnosis, Current Treatment, and Prognosis:  Pt son concerned about pt prognosis and ability to improve- hopeful they can regain  independence.  Emotional Assessment Appearance:  Appears stated age Attitude/Demeanor/Rapport:  Unable to Assess Affect (typically observed):  Unable to Assess Orientation:  Oriented to Self, Oriented to Place Alcohol / Substance use:  Not Applicable Psych involvement (Current and /or in the community):  No (Comment)  Discharge Needs  Concerns to be addressed:  Care Coordination Readmission within the last 30 days:  No Current discharge risk:  Physical Impairment Barriers to Discharge:  Continued Medical Work up   Jorge Ny, LCSW 10/25/2017, 4:18 PM

## 2017-10-25 NOTE — NC FL2 (Signed)
[place] MEDICAID FL2 LEVEL OF CARE SCREENING TOOL     IDENTIFICATION  Patient Name: Sharon [name] Birthdate: Oct 18, 1932 Sex: female Admission Date (Current Location): 11/10/2017  [facility] and [place] Number:  [employer] and Address:  [facility], [place] [address], [place], [place] [address]      Provider Number: [id number]  Attending Physician Name and Address:  Tawni Millers  Relative Name and Phone Number:       Current Level of Care: Hospital Recommended Level of Care: [place] Prior Approval Number:    Date Approved/Denied:   PASRR Number: [id number]  Discharge Plan: SNF    Current Diagnoses: Patient Active Problem List   Diagnosis Date Noted  . Acute renal failure ([place])   . Palliative care by specialist   . Goals of care, counseling/discussion   . Urothelial cancer ([place]) 10/16/2017  . Metastatic urothelial carcinoma ([place])   . Iron deficiency anemia 10/26/2017  . Uremia 10/30/2017  . Hyponatremia 10/22/2017  . Hyperkalemia 11/05/2017  . Metabolic acidosis 67/89/3810  . Hypocalcemia 11/05/2017  . Essential hypertension 08/19/2008  . LYMPHOMA 12/26/2006  . Hypothyroidism 12/26/2006  . OSTEOPENIA 12/26/2006    Orientation RESPIRATION BLADDER Height & Weight     Self, Place  Normal Incontinent, Indwelling catheter Weight: 152 lb 5.4 oz (69.1 kg) Height:  5\' 3"  (160 cm)  BEHAVIORAL SYMPTOMS/MOOD NEUROLOGICAL BOWEL NUTRITION STATUS      Continent Diet(renal 1245ml fluid restriction)  AMBULATORY STATUS COMMUNICATION OF NEEDS Skin   Extensive Assist Verbally Normal                       Personal Care Assistance Level of Assistance  Bathing, Dressing Bathing Assistance: Maximum assistance   Dressing Assistance: Maximum assistance     Functional Limitations Info             SPECIAL CARE FACTORS FREQUENCY  PT (By licensed PT), OT (By licensed OT)     PT Frequency: 5/wk OT  Frequency: 5/wk            Contractures      Additional Factors Info  Code Status, Allergies Code Status Info: FULL Allergies Info: NKA           Current Medications (10/25/2017):  This is the current hospital active medication list Current Facility-Administered Medications  Medication Dose Route Frequency Provider Last Rate Last Dose  . acetaminophen (TYLENOL) tablet 650 mg  650 mg Oral Q6H PRN Ivor Costa, MD   650 mg at 10/24/17 1059  . calcium-vitamin D (OSCAL WITH D) 500-200 MG-UNIT per tablet 1 tablet  1 tablet Oral BID Ivor Costa, MD   1 tablet at 10/24/17 1812  . cholecalciferol (VITAMIN D) tablet 1,000 Units  1,000 Units Oral Daily Ivor Costa, MD   1,000 Units at 10/25/17 1048  . cyclobenzaprine (FLEXERIL) tablet 5 mg  5 mg Oral TID PRN Ivor Costa, MD      . ferrous gluconate [facility]) tablet 324 mg  324 mg Oral Q breakfast Rai, Ripudeep K, MD   324 mg at 10/25/17 0848  . hydrocortisone (ANUSOL-HC) 2.5 % rectal cream   Rectal BID Ivor Costa, MD   1 application at 17/51/02 1057  . levothyroxine (SYNTHROID, LEVOTHROID) tablet 25 mcg  25 mcg Oral QAC breakfast Ivor Costa, MD   25 mcg at 10/25/17 0848  . metoprolol tartrate (LOPRESSOR) injection 5 mg  5 mg Intravenous Q4H PRN Arrien, [employer]  Quillian Quince, MD      . metoprolol tartrate (LOPRESSOR) tablet 12.5 mg  12.5 mg Oral BID Arrien, Jimmy Picket, MD   Stopped at 10/22/17 1811  . multivitamin with minerals tablet 1 tablet  1 tablet Oral Daily Ivor Costa, MD   1 tablet at 10/25/17 1048  . nystatin (MYCOSTATIN) 100000 UNIT/ML suspension 500,000 Units  5 mL Mouth/Throat QID Vianne Bulls, MD   500,000 Units at 10/25/17 1054  . ondansetron (ZOFRAN) injection 4 mg  4 mg Intravenous Q8H PRN Ivor Costa, MD      . polyethylene glycol (MIRALAX / GLYCOLAX) packet 17 g  17 g Oral Daily PRN Ivor Costa, MD      . vitamin B-12 (CYANOCOBALAMIN) tablet 1,000 mcg  1,000 mcg Oral Daily Ivor Costa, MD   1,000 mcg at 10/25/17 1048  . zolpidem  (AMBIEN) tablet 5 mg  5 mg Oral QHS PRN Ivor Costa, MD         Discharge Medications: Please see discharge summary for a list of discharge medications.  Relevant Imaging Results:  Relevant Lab Results:   Additional Information SS#: [id number]  Jorge Ny, LCSW

## 2017-10-25 NOTE — Plan of Care (Signed)
Patient progressing 

## 2017-10-25 NOTE — Progress Notes (Addendum)
PROGRESS NOTE    Sharon [name]  POE:[id number] DOB: 1931/12/15 DOA: 10/20/2017 PCP: Ann Held, DO    Brief Narrative:  83 year old female who presented with decreased urine output. Patient does haveasignificant pastmedicalhistory for hypertension, hypothyroidism, iron deficiency anemiaandrecently diagnosed metastatic urothelial cancer,status post bilateral ureter stent placements due to obstructive uropathy(discharged 1 week prior to hospitalization).Patient was noted to have a drastic decrease in urine output, associated with worsening lower extremity edema.Her blood pressure was 142/52,heart rate 73, respiratory rate 18, temperature 98.3,oxygen saturations 95%.Moist mucous membranes, lungs clear to auscultation bilaterally, heart S1-S2 present rhythmic,abdomen soft,3+ pitting lowerextremityedema.Sodium 119, potassium 6.0, chloride 84, bicarbonate 16, glucose 125, BUN95, creatinine 8.39,calcium 6.2,white cell count 13.7, hemoglobin 9.9, hematocrit 27.7, platelets253.Urinalysis with too numerous, RBCs, and too numerous to count white cells.Chest x-ray with right rotation, bilateral pleural effusions more rightthanleft.CT of the abdomen with multiple lesions throughout the liver most suggestive of metastatic disease, extensive bony metastatic disease in the spine and pelvis, mild bilateral hydronephrosis right greater than left, large bilateral pleural effusions.  Patient was admitted tothehospital with the working diagnosis acute obstructive uropathy, complicated with uremia, metabolic acidosis,hyperkalemia and hyponatremia.  Assessment & Plan:   Principal Problem:   Uremia Active Problems:   Hypothyroidism   Essential hypertension   Iron deficiency anemia   Hyponatremia   Hyperkalemia   Metabolic acidosis   Hypocalcemia   Urothelial cancer ([place])   Acute renal failure ([place])   Palliative care by specialist   Goals of care,  counseling/discussion  1.Acute obstructive uropathy with acute kidney injurywith hyponatremia, hyperkalemia and metabolic acidosis.Urine output over last 24 hours at 200,  target MAP greater than 60, has responded well to isotonic saline boluses. Serum cr down to 3,34 with serum K at 3,8 and bicarbonate at 16. Slow to improve renal function. Patient edematous. Poor prognosis.   2. New onset paroxysmal atrial fibrillation with rapid ventricular response. Patient continue to be sinus rhythm, on metoprolol for rate control, with holding parameters. (Has not been getting due to hypotension). No further GI bleed, will continue to hold anticoagulation.   2.Metastatic urothelial cancerbilateral ureteral stents obstructed, sp right nephrostomy tube. Followed byOncology and Urology. Currently due to severe deconditioning, recent atrial fibrillation and recurrent hypotension, will prefer to hold on invasive procedures. Liver metastatic disease.   3.Hypotension. Will continue to give isotonic saline bolus as needed, hold on maintenance IV fluids due to edematous state, and risk for pulmonary edema. Patient has bilateral pleural effusions, signs of significant third spacing.   4.Hypothyroidism.Onlevothyroxine.  5.Iron deficiency anemia.HB at 9.0 and Hct 27,6, continue to hold heparin, no further signs of bleeding, will continue close monitoring of cell count.   6. Transitory lower GI bleeding. No further bleeding, after holding heparin. Continue to hold on anticoagulation. Patient not stable for diagnostic endoscopic procedures.   DVT prophylaxis:heparin Code Status:full Family Communication:I spoke with patient's husband at the bedside and all questions.  were addressed. I also called her son Avalynn Bowe) over the phone and reviewed advance directives. Considering patient's condition and prior advance directives will change patient to DNR.   Disposition  Plan:home   Consultants:  IR  Nephrology  Procedures:  IR right percutaneous nephrostomy tube.  Antimicrobials:   Subjective: Patient very weak and somnolent, very poor appetite, no nausea or vomiting, no chest pin or dyspnea.   Objective: Vitals:   10/25/17 0429 10/25/17 0450 10/25/17 0630 10/25/17 0843  BP:  (!) 82/45 (!) 94/49 (!) 94/48  Pulse:  94 96 92  Resp:  16    Temp:  97.7 F (36.5 C)    TempSrc:  Oral    SpO2:  94%    Weight: 69.1 kg (152 lb 5.4 oz)     Height:        Intake/Output Summary (Last 24 hours) at 10/25/2017 1222 Last data filed at 10/25/2017 0600 Gross per 24 hour  Intake -  Output 200 ml  Net -200 ml   Filed Weights   10/23/17 0557 10/24/17 0603 10/25/17 0429  Weight: 68.9 kg (151 lb 14.4 oz) 67 kg (147 lb 11.3 oz) 69.1 kg (152 lb 5.4 oz)    Examination:   General: deconditioned and ill looking appearing.  Neurology: Awake and alert, non focal  E ENT: mild pallor, no icterus, oral mucosa moist Cardiovascular: No JVD. S1-S2 present, rhythmic, no gallops, rubs, or murmurs. +++ pitting lower extremity edema. Pulmonary: decreased breath sounds bilaterally at bases, decreased air movement due to poor inspiratory effort, no wheezing, rhonchi or rales. Gastrointestinal. Abdomen mild distention, no organomegaly, non tender, no rebound or guarding Skin. No rashes Musculoskeletal: no joint deformities     Data Reviewed: I have personally reviewed following labs and imaging studies  CBC: Recent Labs  Lab 10/19/17 0642 10/21/17 0201 10/23/17 0219 10/24/17 0200 10/24/17 0747 10/25/17 0210  WBC 8.4 9.7 9.6 9.5 9.4 11.5*  NEUTROABS 6.9  --   --   --   --   --   HGB 8.9* 9.0* 8.7* 8.2* 8.2* 9.0*  HCT 26.4* 26.6* 25.7* 24.4* 24.1* 27.6*  MCV 77.4* 78.2 79.1 79.5 79.3 81.2  PLT 210 173 132* 136* 138* 782*   Basic Metabolic Panel: Recent Labs  Lab 10/21/17 0201 10/22/17 0214 10/23/17 0219 10/24/17 0200 10/25/17 0210   NA 128* 130* 131* 130* 131*  K 3.7 3.7 3.9 3.7 3.8  CL 95* 97* 98* 97* 99*  CO2 17* 18* 17* 19* 16*  GLUCOSE 81 81 62* 83 69  BUN 83* 75* 79* 82* 81*  CREATININE 5.04* 4.27* 4.15* 3.65* 3.34*  CALCIUM 8.0* 8.0* 7.9* 7.7* 8.1*  PHOS 4.9* 4.6 4.8* 4.4 4.3   GFR: Estimated Creatinine Clearance: 11.9 mL/min (A) (by C-G formula based on SCr of 3.34 mg/dL (H)). Liver Function Tests: Recent Labs  Lab 10/21/17 0201 10/22/17 0214 10/23/17 0219 10/24/17 0200 10/25/17 0210  ALBUMIN 1.8* 1.7* 1.8* 1.7* 1.9*   No results for input(s): LIPASE, AMYLASE in the last 168 hours. No results for input(s): AMMONIA in the last 168 hours. Coagulation Profile: No results for input(s): INR, PROTIME in the last 168 hours. Cardiac Enzymes: No results for input(s): CKTOTAL, CKMB, CKMBINDEX, TROPONINI in the last 168 hours. BNP (last 3 results) No results for input(s): PROBNP in the last 8760 hours. HbA1C: No results for input(s): HGBA1C in the last 72 hours. CBG: Recent Labs  Lab 10/23/17 1147 10/23/17 1714 10/24/17 0745 10/24/17 0900 10/25/17 0755  GLUCAP 108* 75 75 80 77   Lipid Profile: No results for input(s): CHOL, HDL, LDLCALC, TRIG, CHOLHDL, LDLDIRECT in the last 72 hours. Thyroid Function Tests: No results for input(s): TSH, T4TOTAL, FREET4, T3FREE, THYROIDAB in the last 72 hours. Anemia Panel: No results for input(s): VITAMINB12, FOLATE, FERRITIN, TIBC, IRON, RETICCTPCT in the last 72 hours.    Radiology Studies: I have reviewed all of the imaging during this hospital visit personally     Scheduled Meds: . calcium-vitamin D  1 tablet Oral BID  . cholecalciferol  1,000 Units  Oral Daily  . ferrous gluconate  324 mg Oral Q breakfast  . hydrocortisone   Rectal BID  . levothyroxine  25 mcg Oral QAC breakfast  . metoprolol tartrate  12.5 mg Oral BID  . multivitamin with minerals  1 tablet Oral Daily  . nystatin  5 mL Mouth/Throat QID  . vitamin B-12  1,000 mcg Oral Daily    Continuous Infusions:   LOS: 10 days        Camden Knotek Gerome Apley, MD [facility] Pager [phone]

## 2017-10-25 NOTE — Progress Notes (Signed)
Physical Therapy Treatment Patient Details Name: Sharon [name] MRN: [id number] DOB: [birth year] Today's Date: 10/25/2017    History of Present Illness Patient is an 83 y/o female admitted to hospital with the working diagnosis acute obstructive uropathy, complicated with uremia, metabolic acidosis, hyperkalemia and hyponatremia.  Patient does have significant past history for hypertension, hypothyroidism, iron deficiency anemia and recently diagnosed metastatic urothelial cancer, status post bilateral ureter stent placements due to obstructive uropathy.  R percutaneous nephrostomy placed 12/3.    PT Comments    Pt not progressing with therapy at this time. She needs much encouragement to mobilize at all and is then requiring max A +2 to get OOB. Was unable to ambulate today. Do not feel that her husband would be able to handle this level of care at home. Changing recommendation to SNF at d/c. Discussed this with husband and he is considering. PT will continue to follow.    Follow Up Recommendations  Supervision/Assistance - 24 hour;SNF     Equipment Recommendations  None recommended by PT    Recommendations for Other Services       Precautions / Restrictions Precautions Precautions: Fall Precaution Comments: R nephrostomy Restrictions Weight Bearing Restrictions: No Other Position/Activity Restrictions: HOH    Mobility  Bed Mobility Overal bed mobility: Needs Assistance Bed Mobility: Supine to Sit     Supine to sit: Max assist;+2 for physical assistance;HOB elevated     General bed mobility comments: pivot to EOB. Pt needed hand over hand guidance for LUE across body to grasp rail. She was then able to pull to assist with getting to EOB but max A +2 still needed for manage lower body  Transfers Overall transfer level: Needs assistance Equipment used: 2 person hand held assist Transfers: Sit to/from [employer] Transfers Sit to Stand: Max assist;+2 physical  assistance Stand pivot transfers: Max assist;+2 physical assistance       General transfer comment: pt unable to achieve full standing, small shuffle steps to recliner with manual facilitation at hips for wt shifting  Ambulation/Gait             General Gait Details: unable today   Stairs            Wheelchair Mobility    Modified Rankin (Stroke Patients Only)       Balance Overall balance assessment: Needs assistance Sitting-balance support: No upper extremity supported Sitting balance-Leahy Scale: Poor Sitting balance - Comments: required minA for maintiaining balance EoB   Standing balance support: Bilateral upper extremity supported Standing balance-Leahy Scale: Poor Standing balance comment: Required UE support throughout session                             Cognition Arousal/Alertness: Lethargic Behavior During Therapy: [facility] for tasks assessed/performed;Flat affect Overall Cognitive Status: Impaired/Different from baseline Area of Impairment: Attention;Following commands;Safety/judgement                   Current Attention Level: Sustained   Following Commands: Follows one step commands consistently;Follows one step commands with increased time Safety/Judgement: Decreased awareness of safety Awareness: Emergent Problem Solving: Slow processing;Requires verbal cues;Requires tactile cues General Comments: lethargic with minimal verbalization      Exercises      General Comments General comments (skin integrity, edema, etc.): husband left once session started but did discuss rehab with him before he stepped out and he said he wasn't sure he wanted that  Pertinent Vitals/Pain Pain Assessment: Faces Faces Pain Scale: Hurts even more Pain Location: "All over" Pain Descriptors / Indicators: Tender;Sore Pain Intervention(s): Limited activity within patient's tolerance;Monitored during session    Home Living                       Prior Function            PT Goals (current goals can now be found in the care plan section) Acute Rehab PT Goals Patient Stated Goal: To return home with assist PT Goal Formulation: With patient/family Time For Goal Achievement: 11/02/17 Potential to Achieve Goals: Fair Progress towards PT goals: Not progressing toward goals - comment(lethargy, fatigue)    Frequency    Min 3X/week      PT Plan Discharge plan needs to be updated    Co-evaluation              AM-PAC PT "6 Clicks" Daily Activity  Outcome Measure  Difficulty turning over in bed (including adjusting bedclothes, sheets and blankets)?: Unable Difficulty moving from lying on back to sitting on the side of the bed? : Unable Difficulty sitting down on and standing up from a chair with arms (e.g., wheelchair, bedside commode, etc,.)?: Unable Help needed moving to and from a bed to chair (including a wheelchair)?: A Lot Help needed walking in hospital room?: Total Help needed climbing 3-5 steps with a railing? : Total 6 Click Score: 7    End of Session Equipment Utilized During Treatment: Gait belt Activity Tolerance: Patient limited by fatigue Patient left: in chair;with family/visitor present;with call bell/phone within reach Nurse Communication: Mobility status PT Visit Diagnosis: Difficulty in walking, not elsewhere classified (R26.2);Muscle weakness (generalized) (M62.81);Unsteadiness on feet (R26.81)     Time: 1355-1410 PT Time Calculation (min) (ACUTE ONLY): 15 min  Charges:  $Therapeutic Activity: 8-22 mins                    G Codes:       Leighton Roach, PT  Acute Rehab Services  [place] 10/25/2017, 2:51 PM

## 2017-10-26 DIAGNOSIS — N1330 Unspecified hydronephrosis: Secondary | ICD-10-CM

## 2017-10-26 LAB — BASIC METABOLIC PANEL
ANION GAP: 16 — AB (ref 5–15)
BUN: 86 mg/dL — AB (ref 6–20)
CALCIUM: 8.1 mg/dL — AB (ref 8.9–10.3)
CO2: 17 mmol/L — AB (ref 22–32)
CREATININE: 3.2 mg/dL — AB (ref 0.44–1.00)
Chloride: 100 mmol/L — ABNORMAL LOW (ref 101–111)
GFR calc Af Amer: 14 mL/min — ABNORMAL LOW (ref >60)
GFR, EST NON AFRICAN AMERICAN: 12 mL/min — AB (ref >60)
GLUCOSE: 93 mg/dL (ref 65–99)
Potassium: 3.9 mmol/L (ref 3.5–5.1)
Sodium: 133 mmol/L — ABNORMAL LOW (ref 135–145)

## 2017-10-26 LAB — CBC
HEMATOCRIT: 25.9 % — AB (ref 36.0–46.0)
HEMOGLOBIN: 8.6 g/dL — AB (ref 12.0–15.0)
MCH: 26.8 pg (ref 26.0–34.0)
MCHC: 33.2 g/dL (ref 30.0–36.0)
MCV: 80.7 fL (ref 78.0–100.0)
Platelets: 91 10*3/uL — ABNORMAL LOW (ref 150–400)
RBC: 3.21 MIL/uL — ABNORMAL LOW (ref 3.87–5.11)
RDW: 19.4 % — ABNORMAL HIGH (ref 11.5–15.5)
WBC: 10.7 10*3/uL — ABNORMAL HIGH (ref 4.0–10.5)

## 2017-10-26 LAB — HEPATIC FUNCTION PANEL
ALBUMIN: 1.8 g/dL — AB (ref 3.5–5.0)
ALK PHOS: 376 U/L — AB (ref 38–126)
ALT: 158 U/L — ABNORMAL HIGH (ref 14–54)
AST: 465 U/L — AB (ref 15–41)
BILIRUBIN INDIRECT: 0.8 mg/dL (ref 0.3–0.9)
BILIRUBIN TOTAL: 1.6 mg/dL — AB (ref 0.3–1.2)
Bilirubin, Direct: 0.8 mg/dL — ABNORMAL HIGH (ref 0.1–0.5)
Total Protein: 4.8 g/dL — ABNORMAL LOW (ref 6.5–8.1)

## 2017-10-26 LAB — AMMONIA: AMMONIA: 55 umol/L — AB (ref 9–35)

## 2017-10-26 LAB — GLUCOSE, CAPILLARY: Glucose-Capillary: 87 mg/dL (ref 65–99)

## 2017-10-26 MED ORDER — LACTULOSE 10 GM/15ML PO SOLN
20.0000 g | Freq: Three times a day (TID) | ORAL | Status: DC
  Administered 2017-10-26 – 2017-10-27 (×4): 20 g via ORAL
  Filled 2017-10-26 (×5): qty 30

## 2017-10-26 NOTE — Progress Notes (Signed)
Occupational Therapy Treatment Patient Details Name: Sharon [name] MRN: [id number] DOB: 20-Apr-1932 Today's Date: 10/26/2017    History of present illness Patient is an 83 y/o female admitted to hospital with the working diagnosis acute obstructive uropathy, complicated with uremia, metabolic acidosis, hyperkalemia and hyponatremia.  Patient does have significant past history for hypertension, hypothyroidism, iron deficiency anemia and recently diagnosed metastatic urothelial cancer, status post bilateral ureter stent placements due to obstructive uropathy.  R percutaneous nephrostomy placed 12/3.   OT comments  Pt limited by lethargy. Second attempt to see pt today and pt with minimal eye contact and verbalizations. Assisted pt with her lunch. Pt with Poor appetite and required hand over hand assist with grooming to wash face and hands and with self feeding at bed level. Palliative care to meet with pt and family 10/27/2017  Follow Up Recommendations  Supervision/Assistance - 24 hour;SNF    Equipment Recommendations  None recommended by OT    Recommendations for Other Services      Precautions / Restrictions Precautions Precautions: Fall Precaution Comments: R nephrostomy Restrictions Weight Bearing Restrictions: No Other Position/Activity Restrictions: HOH       Mobility Bed Mobility               General bed mobility comments: pt declined  Transfers                 General transfer comment: pt declined    Balance                                           ADL either performed or assessed with clinical judgement   ADL   Eating/Feeding: Sitting;Bed level;Minimal assistance Eating/Feeding Details (indicate cue type and reason): pt with minimal eye contact, just nodding head yes and no, poor appetite, hand over hand assist Grooming: Wash/dry face;Set up;Bed level;Wash/dry hands Grooming Details (indicate cue type and reason): pt with  minimal eye contact, just nodding head yes and no, poor appetite, hand over hand assist                               General ADL Comments: pt with significant lethargy, poor appetite     Vision Baseline Vision/History: Wears glasses Patient Visual Report: No change from baseline     Perception     Praxis      Cognition Arousal/Alertness: Lethargic Behavior During Therapy: WFL for tasks assessed/performed;Flat affect Overall Cognitive Status: Impaired/Different from baseline Area of Impairment: Attention;Following commands;Safety/judgement                       Following Commands: Follows one step commands consistently;Follows one step commands with increased time     Problem Solving: Slow processing;Requires verbal cues;Requires tactile cues General Comments: lethargic with minimal verbalization        Exercises     Shoulder Instructions       General Comments      Pertinent Vitals/ Pain       Pain Assessment: Faces Faces Pain Scale: Hurts little more Pain Location: "All over" Pain Descriptors / Indicators: Discomfort Pain Intervention(s): Limited activity within patient's tolerance;Monitored during session  Home Living  Prior Functioning/Environment              Frequency  Min 2X/week        Progress Toward Goals  OT Goals(current goals can now be found in the care plan section)  Progress towards OT goals: Not progressing toward goals - comment     Plan Discharge plan needs to be updated    Co-evaluation                 AM-PAC PT "6 Clicks" Daily Activity     Outcome Measure   Help from another person eating meals?: A Little Help from another person taking care of personal grooming?: A Little Help from another person toileting, which includes using toliet, bedpan, or urinal?: A Lot Help from another person bathing (including washing, rinsing, drying)?:  A Lot Help from another person to put on and taking off regular upper body clothing?: A Lot Help from another person to put on and taking off regular lower body clothing?: A Lot 6 Click Score: 14    End of Session    OT Visit Diagnosis: Muscle weakness (generalized) (M62.81);Unsteadiness on feet (R26.81);Other abnormalities of gait and mobility (R26.89)   Activity Tolerance Patient limited by fatigue;Patient limited by lethargy   Patient Left with call bell/phone within reach;in bed;with bed alarm set;with family/visitor present   Nurse Communication      Functional Assessment Tool Used: AM-PAC 6 Clicks Daily Activity   Time: 2197-5883 OT Time Calculation (min): 14 min  Charges: OT G-codes **NOT FOR INPATIENT CLASS** Functional Assessment Tool Used: AM-PAC 6 Clicks Daily Activity OT General Charges $OT Visit: 1 Visit OT Treatments $Self Care/Home Management : 8-22 mins  Emmit Alexanders [facility] 10/26/2017, 2:30 PM

## 2017-10-26 NOTE — Progress Notes (Signed)
No charge note.  F/u [place] scheduled with family tomorrow at [place], AGNP-C Palliative Medicine  Please call Palliative Medicine team phone with any questions [phone]. For individual providers please see AMION.

## 2017-10-26 NOTE — Progress Notes (Signed)
PROGRESS NOTE  Sharon [name] OXB:[id number] DOB: [birth year] DOA: 11/14/2017 PCP: Ann Held, DO  HPI/Recap of past 62 hours: 83 year old female who presented with decreased urine output. Patient does haveasignificant pastmedicalhistory for hypertension, hypothyroidism, iron deficiency anemiaandrecently diagnosed metastatic urothelial cancer,status post bilateral ureter stent placements due to obstructive uropathy(discharged 1 week prior to hospitalization).Patient was noted to have a drastic decrease in urine output, associated with worsening lower extremity edema and ARF. Sodium 119, potassium 6.0, chloride 84, bicarbonate 16, glucose 125, BUN95, creatinine 8.39,calcium 6.2. Urinalysis with too numerous, RBCs, and too numerous to count white cells.Chest x-ray with right rotation, bilateral pleural effusions more rightthanleft.CT of the abdomen with multiple lesions throughout the liver most suggestive of metastatic disease, extensive bony metastatic disease in the spine and pelvis, mild bilateral hydronephrosis right greater than left, large bilateral pleural effusions. Patient was admitted tothehospital with the working diagnosis acute obstructive uropathy, complicated with uremia, metabolic acidosis,hyperkalemia and hyponatremia.  Today, pt noted to be doing poorly, very lethargic, ill appearing, denied any chest pain, worsening shortness of breath, abdominal pain, nausea/vomiting/diarrhea/constipation, fever/chills.  Palliative care on board, scheduled a family [name] meeting for 10/27/17.   Assessment/Plan: Principal Problem:   Uremia Active Problems:   Hypothyroidism   Essential hypertension   Iron deficiency anemia   Hyponatremia   Hyperkalemia   Metabolic acidosis   Hypocalcemia   Urothelial cancer (HCC)   Acute renal failure (HCC)   Palliative care by specialist   Goals of care, counseling/discussion  #Acute obstructive uropathy with acute kidney  injurywith hyponatremia, hyperkalemia and metabolic acidosis Ongoing Serum cr down to 3.20, bicarb at 17, slow to improve renal fxn Urine output over last 24 hours at 150 Responding ok to isotonic saline boluses Patient edematous. Very poor prognosis Palliative team on board, meeting with family on 10/27/17 Nephrology signed off Daily BMP  #Metastatic urothelial cancerbilateral ureteral stents obstructed, sp right nephrostomy tube.  Followed by Urology Currently due to severe deconditioning, recent atrial fibrillation and recurrent hypotension, will prefer to hold on invasive procedures. Liver metastatic disease Palliative team on board, meeting for [place] with family on 10/27/17   #Elevated liver enzymes Likely due to metastatic disease AST 465, ALT 158, ALP 376, T.bili 1.6 Ammonia 55 Give lactulose Poor prognosis  #Hypotension Ongoing Continue isotonic saline bolus as needed No need for maintenance IV fluids due to edematous state, and risk for pulmonary edema Bilateral pleural effusions, signs of significant third spacing.   #New onset paroxysmal atrial fibrillation with rapid ventricular response Sinus rhythm, Rate controlled On metoprolol for rate control, with holding parameters. (Has not been getting due to hypotension) No further GI bleed, will continue to hold anticoagulation for now  #Hypothyroidism Onlevothyroxine  #Iron deficiency anemia. HB stable Continue to hold heparin, no further signs of bleeding Daily CBC  #Transitory lower GI bleeding No further bleeding, after holding heparin Continue to hold anticoagulation Patient not stable for diagnostic endoscopic procedures.     Code Status: DNR  Family Communication: None at bedside  Disposition Plan: Palliative team [place] meeting with family on 10/27/17   Consultants:  IR  Urology  Nephrology  Procedures:  IR R percutaneous nephrostomy tube placement  Antimicrobials:  None  DVT  prophylaxis:  SCDs due to bleeding   Objective: Vitals:   10/25/17 1716 10/25/17 2200 10/26/17 0531 10/26/17 0655  BP: (!) 94/46 (!) 90/45 (!) 100/45   Pulse:  94 96   Resp:  18 18   Temp:  98.5  F (36.9 C) 98.2 F (36.8 C)   TempSrc:  Oral Oral   SpO2:  95% 94%   Weight:    68.5 kg (151 lb 0.2 oz)  Height:        Intake/Output Summary (Last 24 hours) at 10/26/2017 1151 Last data filed at 10/26/2017 4010 Gross per 24 hour  Intake 50 ml  Output 150 ml  Net -100 ml   Filed Weights   10/24/17 0603 10/25/17 0429 10/26/17 0655  Weight: 67 kg (147 lb 11.3 oz) 69.1 kg (152 lb 5.4 oz) 68.5 kg (151 lb 0.2 oz)    Exam:   General: Alert, awake, lethargic, ill looking appearance, deconditioned  Cardiovascular: S1-S2 present, no added heart sounds  Respiratory: Decreased breath sounds bilaterally at the base, poor inspiratory effort  Abdomen: Soft, nontender, mild distention, bowel sounds present  Musculoskeletal: 3+ pitting LE edema bilaterally  Skin: Normal  Psychiatry: Unable to assess   Data Reviewed: CBC: Recent Labs  Lab 10/23/17 0219 10/24/17 0200 10/24/17 0747 10/25/17 0210 10/26/17 0535  WBC 9.6 9.5 9.4 11.5* 10.7*  HGB 8.7* 8.2* 8.2* 9.0* 8.6*  HCT 25.7* 24.4* 24.1* 27.6* 25.9*  MCV 79.1 79.5 79.3 81.2 80.7  PLT 132* 136* 138* 123* 91*   Basic Metabolic Panel: Recent Labs  Lab 10/21/17 0201 10/22/17 0214 10/23/17 0219 10/24/17 0200 10/25/17 0210 10/26/17 0535  NA 128* 130* 131* 130* 131* 133*  K 3.7 3.7 3.9 3.7 3.8 3.9  CL 95* 97* 98* 97* 99* 100*  CO2 17* 18* 17* 19* 16* 17*  GLUCOSE 81 81 62* 83 69 93  BUN 83* 75* 79* 82* 81* 86*  CREATININE 5.04* 4.27* 4.15* 3.65* 3.34* 3.20*  CALCIUM 8.0* 8.0* 7.9* 7.7* 8.1* 8.1*  PHOS 4.9* 4.6 4.8* 4.4 4.3  --    GFR: Estimated Creatinine Clearance: 12.4 mL/min (A) (by C-G formula based on SCr of 3.2 mg/dL (H)). Liver Function Tests: Recent Labs  Lab 10/22/17 0214 10/23/17 0219 10/24/17 0200  10/25/17 0210 10/26/17 0535  AST  --   --   --   --  465*  ALT  --   --   --   --  158*  ALKPHOS  --   --   --   --  376*  BILITOT  --   --   --   --  1.6*  PROT  --   --   --   --  4.8*  ALBUMIN 1.7* 1.8* 1.7* 1.9* 1.8*   No results for input(s): LIPASE, AMYLASE in the last 168 hours. Recent Labs  Lab 10/26/17 0535  AMMONIA 55*   Coagulation Profile: No results for input(s): INR, PROTIME in the last 168 hours. Cardiac Enzymes: No results for input(s): CKTOTAL, CKMB, CKMBINDEX, TROPONINI in the last 168 hours. BNP (last 3 results) No results for input(s): PROBNP in the last 8760 hours. HbA1C: No results for input(s): HGBA1C in the last 72 hours. CBG: Recent Labs  Lab 10/23/17 1714 10/24/17 0745 10/24/17 0900 10/25/17 0755 10/26/17 0821  GLUCAP 75 75 80 77 87   Lipid Profile: No results for input(s): CHOL, HDL, LDLCALC, TRIG, CHOLHDL, LDLDIRECT in the last 72 hours. Thyroid Function Tests: No results for input(s): TSH, T4TOTAL, FREET4, T3FREE, THYROIDAB in the last 72 hours. Anemia Panel: No results for input(s): VITAMINB12, FOLATE, FERRITIN, TIBC, IRON, RETICCTPCT in the last 72 hours. Urine analysis:    Component Value Date/Time   COLORURINE RED (A) 10/16/2017 1053   APPEARANCEUR TURBID (A)  10/16/2017 1053   LABSPEC  10/16/2017 1053    TEST NOT REPORTED DUE TO COLOR INTERFERENCE OF URINE PIGMENT   PHURINE  10/16/2017 1053    TEST NOT REPORTED DUE TO COLOR INTERFERENCE OF URINE PIGMENT   GLUCOSEU (A) 10/16/2017 1053    TEST NOT REPORTED DUE TO COLOR INTERFERENCE OF URINE PIGMENT   HGBUR (A) 10/16/2017 1053    TEST NOT REPORTED DUE TO COLOR INTERFERENCE OF URINE PIGMENT   HGBUR negative 08/24/2010 0824   BILIRUBINUR (A) 10/16/2017 1053    TEST NOT REPORTED DUE TO COLOR INTERFERENCE OF URINE PIGMENT   BILIRUBINUR Neg 09/25/2012 1453   KETONESUR (A) 10/16/2017 1053    TEST NOT REPORTED DUE TO COLOR INTERFERENCE OF URINE PIGMENT   PROTEINUR (A) 10/16/2017 1053      TEST NOT REPORTED DUE TO COLOR INTERFERENCE OF URINE PIGMENT   UROBILINOGEN 0.2 09/25/2012 1453   UROBILINOGEN 0.2 08/24/2010 0824   NITRITE (A) 10/16/2017 1053    TEST NOT REPORTED DUE TO COLOR INTERFERENCE OF URINE PIGMENT   LEUKOCYTESUR (A) 10/16/2017 1053    TEST NOT REPORTED DUE TO COLOR INTERFERENCE OF URINE PIGMENT   Sepsis Labs: @LABRCNTIP (procalcitonin:4,lacticidven:4)  ) Recent Results (from the past 240 hour(s))  Culture, Urine     Status: Abnormal   Collection Time: 10/16/17 11:50 PM  Result Value Ref Range Status   Specimen Description URINE, CATHETERIZED  Final   Special Requests NONE  Final   Culture <10,000 COLONIES/mL INSIGNIFICANT GROWTH (A)  Final   Report Status 10/18/2017 FINAL  Final      Studies: No results found.  Scheduled Meds: . calcium-vitamin D  1 tablet Oral BID  . cholecalciferol  1,000 Units Oral Daily  . ferrous gluconate  324 mg Oral Q breakfast  . hydrocortisone   Rectal BID  . levothyroxine  25 mcg Oral QAC breakfast  . metoprolol tartrate  12.5 mg Oral BID  . multivitamin with minerals  1 tablet Oral Daily  . nystatin  5 mL Mouth/Throat QID  . vitamin B-12  1,000 mcg Oral Daily    Continuous Infusions:   LOS: 11 days     Alma Friendly, MD [facility]   If 7PM-7AM, please contact night-coverage [email or web address] Password [email or web address] 10/26/2017, 11:51 AM

## 2017-10-27 ENCOUNTER — Other Ambulatory Visit: Payer: Self-pay | Admitting: Oncology

## 2017-10-27 DIAGNOSIS — Z7189 Other specified counseling: Secondary | ICD-10-CM

## 2017-10-27 DIAGNOSIS — C689 Malignant neoplasm of urinary organ, unspecified: Secondary | ICD-10-CM

## 2017-10-27 DIAGNOSIS — J90 Pleural effusion, not elsewhere classified: Secondary | ICD-10-CM

## 2017-10-27 LAB — GLUCOSE, CAPILLARY
GLUCOSE-CAPILLARY: 78 mg/dL (ref 65–99)
Glucose-Capillary: 64 mg/dL — ABNORMAL LOW (ref 65–99)
Glucose-Capillary: 81 mg/dL (ref 65–99)

## 2017-10-27 LAB — CBC
HCT: 27.2 % — ABNORMAL LOW (ref 36.0–46.0)
HEMOGLOBIN: 9 g/dL — AB (ref 12.0–15.0)
MCH: 26.9 pg (ref 26.0–34.0)
MCHC: 33.1 g/dL (ref 30.0–36.0)
MCV: 81.2 fL (ref 78.0–100.0)
Platelets: 90 10*3/uL — ABNORMAL LOW (ref 150–400)
RBC: 3.35 MIL/uL — AB (ref 3.87–5.11)
RDW: 20.1 % — ABNORMAL HIGH (ref 11.5–15.5)
WBC: 11.4 10*3/uL — ABNORMAL HIGH (ref 4.0–10.5)

## 2017-10-27 LAB — RENAL FUNCTION PANEL
ANION GAP: 16 — AB (ref 5–15)
Albumin: 1.8 g/dL — ABNORMAL LOW (ref 3.5–5.0)
BUN: 86 mg/dL — ABNORMAL HIGH (ref 6–20)
CHLORIDE: 100 mmol/L — AB (ref 101–111)
CO2: 19 mmol/L — AB (ref 22–32)
CREATININE: 2.87 mg/dL — AB (ref 0.44–1.00)
Calcium: 8.2 mg/dL — ABNORMAL LOW (ref 8.9–10.3)
GFR calc non Af Amer: 14 mL/min — ABNORMAL LOW (ref >60)
GFR, EST AFRICAN AMERICAN: 16 mL/min — AB (ref >60)
GLUCOSE: 66 mg/dL (ref 65–99)
Phosphorus: 3.3 mg/dL (ref 2.5–4.6)
Potassium: 3.8 mmol/L (ref 3.5–5.1)
Sodium: 135 mmol/L (ref 135–145)

## 2017-10-27 LAB — AMMONIA: Ammonia: 46 umol/L — ABNORMAL HIGH (ref 9–35)

## 2017-10-27 MED ORDER — DEXTROSE 50 % IV SOLN
25.0000 g | Freq: Once | INTRAVENOUS | Status: AC | PRN
  Administered 2017-10-28 – 2017-10-29 (×2): 25 g via INTRAVENOUS
  Filled 2017-10-27: qty 50

## 2017-10-27 MED ORDER — DEXAMETHASONE 0.5 MG PO TABS
1.0000 mg | ORAL_TABLET | Freq: Every day | ORAL | Status: DC
  Filled 2017-10-27 (×2): qty 2

## 2017-10-27 MED ORDER — MAGIC MOUTHWASH
10.0000 mL | Freq: Four times a day (QID) | ORAL | Status: DC
  Administered 2017-10-27 (×3): 10 mL via ORAL
  Filled 2017-10-27 (×3): qty 10

## 2017-10-27 MED ORDER — MAGIC MOUTHWASH
10.0000 mL | Freq: Three times a day (TID) | ORAL | Status: DC
Start: 2017-10-27 — End: 2017-10-27

## 2017-10-27 NOTE — Progress Notes (Signed)
PROGRESS NOTE  Sharon [name]:[id number] DOB: 07/21/1932 DOA: 11/08/2017 PCP: Ann Held, DO  HPI/Recap of past 42 hours: 83 year old female who presented with decreased urine output. Patient does haveasignificant pastmedicalhistory for hypertension, hypothyroidism, iron deficiency anemiaandrecently diagnosed metastatic urothelial cancer,status post bilateral ureter stent placements due to obstructive uropathy(discharged 1 week prior to hospitalization).Patient was noted to have a drastic decrease in urine output, associated with worsening lower extremity edema and ARF. Sodium 119, potassium 6.0, chloride 84, bicarbonate 16, glucose 125, BUN95, creatinine 8.39,calcium 6.2. Urinalysis with too numerous, RBCs, and too numerous to count white cells.Chest x-ray with right rotation, bilateral pleural effusions more rightthanleft.CT of the abdomen with multiple lesions throughout the liver most suggestive of metastatic disease, extensive bony metastatic disease in the spine and pelvis, mild bilateral hydronephrosis right greater than left, large bilateral pleural effusions. Patient was admitted tothehospital with the working diagnosis acute obstructive uropathy, complicated with uremia, metabolic acidosis,hyperkalemia and hyponatremia.  Today, met pt with husband, who reported pt ate a little bit. Still noted to be doing poorly, very lethargic, ill appearing, denied any chest pain, worsening shortness of breath, abdominal pain, nausea/vomiting/diarrhea/constipation, fever/chills.   Assessment/Plan: Principal Problem:   Uremia Active Problems:   Hypothyroidism   Essential hypertension   Iron deficiency anemia   Hyponatremia   Hyperkalemia   Metabolic acidosis   Hypocalcemia   Urothelial cancer (HCC)   Acute renal failure (HCC)   Palliative care by specialist   Goals of care, counseling/discussion  #Acute obstructive uropathy with acute kidney injurywith  hyponatremia, hyperkalemia and metabolic acidosis Ongoing Serum cr down to 2.87, bicarb at 19, slow to improve renal fxn Urine output over last 24 hours at 400cc Responding ok to isotonic saline boluses Patient edematous. Very poor prognosis Palliative team on board, meeting with family on 10/27/17, awaiting further recs Nephrology signed off Daily BMP  #Metastatic urothelial cancerbilateral ureteral stents obstructed, sp right nephrostomy tube.  Followed by Urology Currently due to severe deconditioning, recent atrial fibrillation and recurrent hypotension, will prefer to hold on invasive procedures. Liver metastatic disease -Palliative team on board, meeting for [place] with family on 10/27/17, family planning on going the hospice route, but would like to speak with oncologist for further recommendation -Spoke to Oncologist Dr Zola Button 12/13, who will review pt chart and give family a call or come to see pt hopefully 10/28/17 for further recommendation  #Elevated liver enzymes Likely due to metastatic disease AST 465, ALT 158, ALP 376, T.bili 1.6 Ammonia 55 Give lactulose Poor prognosis  #Hypotension Ongoing Continue isotonic saline bolus as needed No need for maintenance IV fluids due to edematous state, and risk for pulmonary edema Bilateral pleural effusions, signs of significant third spacing.   #New onset paroxysmal atrial fibrillation with rapid ventricular response Sinus rhythm, Rate controlled On metoprolol for rate control, with holding parameters. (Has not been getting due to hypotension) No further GI bleed, will continue to hold anticoagulation for now  #Hypothyroidism Onlevothyroxine  #Iron deficiency anemia. HB stable Continue to hold heparin, no further signs of bleeding Daily CBC  #Transitory lower GI bleeding No further bleeding, after holding heparin Continue to hold anticoagulation Patient not stable for diagnostic endoscopic procedures.      Code Status: DNR  Family Communication: None at bedside  Disposition Plan: Palliative team [place] meeting with family on 10/27/17, pending rec from oncology   Consultants:  IR  Urology  Nephrology  Procedures:  IR R percutaneous nephrostomy tube placement  Antimicrobials:  None  DVT prophylaxis:  SCDs due to bleeding   Objective: Vitals:   10/26/17 0655 10/26/17 1517 10/26/17 2317 10/27/17 0500  BP:  (!) 98/42 (!) 109/51 (!) 106/46  Pulse:  87 95 98  Resp:   17 17  Temp:  98.3 F (36.8 C) 97.8 F (36.6 C) 98.3 F (36.8 C)  TempSrc:  Oral Oral Oral  SpO2:  95% 96% 94%  Weight: 68.5 kg (151 lb 0.2 oz)   68.5 kg (151 lb 0.2 oz)  Height:        Intake/Output Summary (Last 24 hours) at 10/27/2017 1213 Last data filed at 10/27/2017 0554 Gross per 24 hour  Intake 140 ml  Output 400 ml  Net -260 ml   Filed Weights   10/25/17 0429 10/26/17 0655 10/27/17 0500  Weight: 69.1 kg (152 lb 5.4 oz) 68.5 kg (151 lb 0.2 oz) 68.5 kg (151 lb 0.2 oz)    Exam:   General: Alert, awake, lethargic, ill looking appearance, deconditioned  Cardiovascular: S1-S2 present, no added heart sounds  Respiratory: Decreased breath sounds bilaterally at the base, poor inspiratory effort  Abdomen: Soft, nontender, mild distention, bowel sounds present  Musculoskeletal: 3+ pitting LE edema bilaterally  Skin: Normal  Psychiatry: Unable to assess   Data Reviewed: CBC: Recent Labs  Lab 10/24/17 0200 10/24/17 0747 10/25/17 0210 10/26/17 0535 10/27/17 0516  WBC 9.5 9.4 11.5* 10.7* 11.4*  HGB 8.2* 8.2* 9.0* 8.6* 9.0*  HCT 24.4* 24.1* 27.6* 25.9* 27.2*  MCV 79.5 79.3 81.2 80.7 81.2  PLT 136* 138* 123* 91* 90*   Basic Metabolic Panel: Recent Labs  Lab 10/22/17 0214 10/23/17 0219 10/24/17 0200 10/25/17 0210 10/26/17 0535 10/27/17 0516  NA 130* 131* 130* 131* 133* 135  K 3.7 3.9 3.7 3.8 3.9 3.8  CL 97* 98* 97* 99* 100* 100*  CO2 18* 17* 19* 16* 17* 19*  GLUCOSE  81 62* 83 69 93 66  BUN 75* 79* 82* 81* 86* 86*  CREATININE 4.27* 4.15* 3.65* 3.34* 3.20* 2.87*  CALCIUM 8.0* 7.9* 7.7* 8.1* 8.1* 8.2*  PHOS 4.6 4.8* 4.4 4.3  --  3.3   GFR: Estimated Creatinine Clearance: 13.8 mL/min (A) (by C-G formula based on SCr of 2.87 mg/dL (H)). Liver Function Tests: Recent Labs  Lab 10/23/17 0219 10/24/17 0200 10/25/17 0210 10/26/17 0535 10/27/17 0516  AST  --   --   --  465*  --   ALT  --   --   --  158*  --   ALKPHOS  --   --   --  376*  --   BILITOT  --   --   --  1.6*  --   PROT  --   --   --  4.8*  --   ALBUMIN 1.8* 1.7* 1.9* 1.8* 1.8*   No results for input(s): LIPASE, AMYLASE in the last 168 hours. Recent Labs  Lab 10/26/17 0535 10/27/17 0516  AMMONIA 55* 46*   Coagulation Profile: No results for input(s): INR, PROTIME in the last 168 hours. Cardiac Enzymes: No results for input(s): CKTOTAL, CKMB, CKMBINDEX, TROPONINI in the last 168 hours. BNP (last 3 results) No results for input(s): PROBNP in the last 8760 hours. HbA1C: No results for input(s): HGBA1C in the last 72 hours. CBG: Recent Labs  Lab 10/24/17 0900 10/25/17 0755 10/26/17 0821 10/27/17 0756 10/27/17 0945  GLUCAP 80 77 87 64* 81   Lipid Profile: No results for input(s): CHOL, HDL, LDLCALC, TRIG, CHOLHDL, LDLDIRECT  in the last 72 hours. Thyroid Function Tests: No results for input(s): TSH, T4TOTAL, FREET4, T3FREE, THYROIDAB in the last 72 hours. Anemia Panel: No results for input(s): VITAMINB12, FOLATE, FERRITIN, TIBC, IRON, RETICCTPCT in the last 72 hours. Urine analysis:    Component Value Date/Time   COLORURINE RED (A) 10/16/2017 1053   APPEARANCEUR TURBID (A) 10/16/2017 1053   LABSPEC  10/16/2017 1053    TEST NOT REPORTED DUE TO COLOR INTERFERENCE OF URINE PIGMENT   PHURINE  10/16/2017 1053    TEST NOT REPORTED DUE TO COLOR INTERFERENCE OF URINE PIGMENT   GLUCOSEU (A) 10/16/2017 1053    TEST NOT REPORTED DUE TO COLOR INTERFERENCE OF URINE PIGMENT   HGBUR  (A) 10/16/2017 1053    TEST NOT REPORTED DUE TO COLOR INTERFERENCE OF URINE PIGMENT   HGBUR negative 08/24/2010 0824   BILIRUBINUR (A) 10/16/2017 1053    TEST NOT REPORTED DUE TO COLOR INTERFERENCE OF URINE PIGMENT   BILIRUBINUR Neg 09/25/2012 1453   KETONESUR (A) 10/16/2017 1053    TEST NOT REPORTED DUE TO COLOR INTERFERENCE OF URINE PIGMENT   PROTEINUR (A) 10/16/2017 1053    TEST NOT REPORTED DUE TO COLOR INTERFERENCE OF URINE PIGMENT   UROBILINOGEN 0.2 09/25/2012 1453   UROBILINOGEN 0.2 08/24/2010 0824   NITRITE (A) 10/16/2017 1053    TEST NOT REPORTED DUE TO COLOR INTERFERENCE OF URINE PIGMENT   LEUKOCYTESUR (A) 10/16/2017 1053    TEST NOT REPORTED DUE TO COLOR INTERFERENCE OF URINE PIGMENT   Sepsis Labs: '@LABRCNTIP' (procalcitonin:4,lacticidven:4)  ) No results found for this or any previous visit (from the past 240 hour(s)).    Studies: No results found.  Scheduled Meds: . calcium-vitamin D  1 tablet Oral BID  . cholecalciferol  1,000 Units Oral Daily  . ferrous gluconate  324 mg Oral Q breakfast  . hydrocortisone   Rectal BID  . lactulose  20 g Oral TID  . levothyroxine  25 mcg Oral QAC breakfast  . metoprolol tartrate  12.5 mg Oral BID  . multivitamin with minerals  1 tablet Oral Daily  . nystatin  5 mL Mouth/Throat QID  . vitamin B-12  1,000 mcg Oral Daily    Continuous Infusions:   LOS: 12 days     Alma Friendly, MD [facility]   If 7PM-7AM, please contact night-coverage [email or web address] Password [email or web address] 10/27/2017, 12:13 PM

## 2017-10-27 NOTE — Plan of Care (Signed)
progressing 

## 2017-10-27 NOTE — Progress Notes (Addendum)
Daily Progress Note   Patient Name: Sharon [name]       Date: 10/27/2017 DOB: 1932/02/08  Age: 83 y.o. MRN#: [id number] Attending Physician: Sharon Friendly, MD Primary Care Physician: Sharon [name], Sharon Apa, DO Admit Date: 10/26/2017  Reason for Consultation/Follow-up: Establishing goals of care  Subjective: Patient in bed, lethargic. Nonverbal. Jaundiced. Grimaces when swallowing ice chips. Tongue red with sores. Per family has complained of mouth and throat hurting. Also complains of feet hurting when touched, pain in back and sacral area (patient noted to have mets to spine and pelvis).  Met with patient's son, Sharon [name], spouse and daughter in law to discuss [place].  Reviewed hospital admission and illness trajectory. Pt has continued to decline in functional status. She is sleeping more than awake. She is not eating. New onset a fib w/ RVR.  Poor nutritional status, liver function decreasing- albumin 1.8. Discussed that patient appears to be dying. Discussed GOC- reviewed options of continued aggressive medical care vs comfort care.  Family considering comfort care and Hospice but would like Oncology consult first.  If decision is made for transition to Hospice and comfort care they would like to attempt to discharge patient to [facility] where her other son resides with Hospice support.   Review of Systems  Unable to perform ROS: Acuity of condition    Length of Stay: 12  Current Medications: Scheduled Meds:  . calcium-vitamin D  1 tablet Oral BID  . cholecalciferol  1,000 Units Oral Daily  . ferrous gluconate  324 mg Oral Q breakfast  . hydrocortisone   Rectal BID  . lactulose  20 g Oral TID  . levothyroxine  25 mcg Oral QAC breakfast  . metoprolol tartrate  12.5 mg  Oral BID  . multivitamin with minerals  1 tablet Oral Daily  . nystatin  5 mL Mouth/Throat QID  . vitamin B-12  1,000 mcg Oral Daily    Continuous Infusions:   PRN Meds: acetaminophen, cyclobenzaprine, dextrose, metoprolol tartrate, ondansetron, polyethylene glycol, zolpidem  Physical Exam  Constitutional:  Ill appearing   Cardiovascular: Normal rate and regular rhythm.  Pulmonary/Chest: Effort normal.  Neurological:  Lethargic   Nursing note and vitals reviewed.           Vital Signs: BP (!) 106/46 (BP Location: Left Arm)  Pulse 98   Temp 98.3 F (36.8 C) (Oral)   Resp 17   Ht '5\' 3"'  (1.6 m)   Wt 68.5 kg (151 lb 0.2 oz)   SpO2 94%   BMI 26.75 kg/m  SpO2: SpO2: 94 % O2 Device: O2 Device: Not Delivered O2 Flow Rate: O2 Flow Rate (L/min): 2 L/min  Intake/output summary:   Intake/Output Summary (Last 24 hours) at 10/27/2017 1208 Last data filed at 10/27/2017 0554 Gross per 24 hour  Intake 140 ml  Output 400 ml  Net -260 ml   LBM: Last BM Date: 10/25/17 Baseline Weight: Weight: 62.6 kg (138 lb) Most recent weight: Weight: 68.5 kg (151 lb 0.2 oz)       Palliative Assessment/Data: PPS: 20%   Flowsheet Rows     Most Recent Value  Intake Tab  Referral Department  Hospitalist  Unit at Time of Referral  Med/Surg Unit  Palliative Care Primary Diagnosis  Cancer  Date Notified  10/17/17  Palliative Care Type  New Palliative care  Reason for referral  Clarify Goals of Care  Date of Admission  10/29/2017  Date first seen by Palliative Care  10/18/17  # of days Palliative referral response time  1 Day(s)  # of days IP prior to Palliative referral  2  Clinical Assessment  Palliative Performance Scale Score  50%  Psychosocial & Spiritual Assessment  Palliative Care Outcomes  Patient/Family meeting held?  Yes  Who was at the meeting?  patient, husband, son  Palliative Care Outcomes  Clarified goals of care, Provided psychosocial or spiritual support, ACP  counseling assistance, Linked to palliative care logitudinal support      Patient Active Problem List   Diagnosis Date Noted  . Acute renal failure ([place])   . Palliative care by specialist   . Goals of care, counseling/discussion   . Urothelial cancer ([place]) 10/16/2017  . Metastatic urothelial carcinoma ([place])   . Iron deficiency anemia 11/05/2017  . Uremia 10/16/2017  . Hyponatremia 11/01/2017  . Hyperkalemia 10/28/2017  . Metabolic acidosis 41/01/130  . Hypocalcemia 10/19/2017  . Essential hypertension 08/19/2008  . LYMPHOMA 12/26/2006  . Hypothyroidism 12/26/2006  . OSTEOPENIA 12/26/2006    Palliative Care Assessment & Plan   Patient Profile: 83 y.o. female  with past medical history of thyroid disease, osteopenia, lymphoma, hypertension, CHF, acute renal insufficiency, and new diagnosis of metastatic urothelial cancer s/p stent placement admitted on 11/10/2017 with decreased urine output. In ED, labs revealed K+ 6.0, Creat 8.39, BUN 95, NA 119, and WBC's 13.7. CT abd/pelvis reveals mild bilateral hydronephrosis (right > left), left kidney atrophy, and numerous lesions throughout the liver suggestive of metastatic disease. Also extensive bony mets in spine and pelvis. Acute renal failure likely due to hydronephrosis s/p bilateral stents on 11/18. Urology and Nephrology following. Palliative medicine consultation for goals of care.   Assessment/Recommendations/Plan   Patient appears to be dying  Consult to Oncology for family peace of mind re: no systemic options for treatment  Social work consult for possible discharge to [facility] with Hospice support if decision is made for transition to comfort care   Symptom mgmt- start magic mouthwash 69m swish and swallow qid- noted she has been on nystatin suspension since 12/4 but will switch to MM for added symptom relief  Low dose dexamethasone- 174mpo daily- for adjuvant pain relief for bone mets- family is concerned about  pain medications making patient too sleepy   Code Status:  DNR  Prognosis:   <  2 weeks  Discharge Planning:  To Be Determined  Care plan was discussed with patient's family and Dr. Horris [name].  Thank you for allowing the Palliative Medicine Team to assist in the care of this patient.   Time In: 1045 Time Out: 1230 Total Time 105 minutes Prolonged Time Billed Yes      Greater than 50%  of this time was spent counseling and coordinating care related to the above assessment and plan.  Mariana Kaufman, AGNP-C Palliative Medicine   Please contact Palliative Medicine Team phone at [phone] for questions and concerns.

## 2017-10-27 NOTE — Progress Notes (Signed)
Hypoglycemic Event  CBG: 64  Treatment: 15 GM carbohydrate snack  Symptoms: Nervous/irritable  Follow-up CBG: [id number] CBG Result:81  Possible Reasons for Event: Inadequate meal intake  Comments/MD notified:n/a    Sharon [name] [name]

## 2017-10-27 NOTE — Progress Notes (Signed)
I was asked to comment on Sharon [name] case as she is scheduled to see me in the office on 10/28/2017.  Her chart was reviewed today including imaging studies obtained on October 16, 2017.  He has been hospitalized since October 15, 2017.  These findings suggest advanced malignancy with hepatic and bone involvement.  This is in the setting of multiple comorbidities and severe debilitation.  She also appears to have cirrhosis of the liver with elevated transaminases as well as elevated ammonia.  Given all of these findings, I do not see her as a candidate for any palliative systemic therapy.  She has multiorgan dysfunction with poor performance status in the setting of stage IV cancer.  I recommend proceeding with palliative care and hospice moving forward.  These findings were discussed over the phone with her son Gittel Mccamish.

## 2017-10-28 ENCOUNTER — Ambulatory Visit: Payer: Medicare Other | Admitting: Oncology

## 2017-10-28 LAB — CBC WITH DIFFERENTIAL/PLATELET
Basophils Absolute: 0 10*3/uL (ref 0.0–0.1)
Basophils Relative: 0 %
EOS ABS: 0 10*3/uL (ref 0.0–0.7)
Eosinophils Relative: 0 %
HEMATOCRIT: 26.8 % — AB (ref 36.0–46.0)
HEMOGLOBIN: 8.7 g/dL — AB (ref 12.0–15.0)
Lymphocytes Relative: 9 %
Lymphs Abs: 1.1 10*3/uL (ref 0.7–4.0)
MCH: 26.5 pg (ref 26.0–34.0)
MCHC: 32.5 g/dL (ref 30.0–36.0)
MCV: 81.7 fL (ref 78.0–100.0)
Monocytes Absolute: 0.6 10*3/uL (ref 0.1–1.0)
Monocytes Relative: 5 %
NEUTROS PCT: 86 %
Neutro Abs: 10.5 10*3/uL — ABNORMAL HIGH (ref 1.7–7.7)
Platelets: 87 10*3/uL — ABNORMAL LOW (ref 150–400)
RBC: 3.28 MIL/uL — ABNORMAL LOW (ref 3.87–5.11)
RDW: 20.3 % — ABNORMAL HIGH (ref 11.5–15.5)
WBC: 12.3 10*3/uL — ABNORMAL HIGH (ref 4.0–10.5)

## 2017-10-28 LAB — GLUCOSE, CAPILLARY
GLUCOSE-CAPILLARY: 69 mg/dL (ref 65–99)
Glucose-Capillary: 107 mg/dL — ABNORMAL HIGH (ref 65–99)
Glucose-Capillary: 74 mg/dL (ref 65–99)

## 2017-10-28 LAB — RENAL FUNCTION PANEL
ANION GAP: 13 (ref 5–15)
Albumin: 1.8 g/dL — ABNORMAL LOW (ref 3.5–5.0)
BUN: 87 mg/dL — ABNORMAL HIGH (ref 6–20)
CALCIUM: 8.2 mg/dL — AB (ref 8.9–10.3)
CO2: 20 mmol/L — AB (ref 22–32)
Chloride: 103 mmol/L (ref 101–111)
Creatinine, Ser: 3.12 mg/dL — ABNORMAL HIGH (ref 0.44–1.00)
GFR calc Af Amer: 15 mL/min — ABNORMAL LOW (ref >60)
GFR calc non Af Amer: 13 mL/min — ABNORMAL LOW (ref >60)
GLUCOSE: 67 mg/dL (ref 65–99)
Phosphorus: 3.4 mg/dL (ref 2.5–4.6)
Potassium: 4.4 mmol/L (ref 3.5–5.1)
SODIUM: 136 mmol/L (ref 135–145)

## 2017-10-28 LAB — AMMONIA: Ammonia: 46 umol/L — ABNORMAL HIGH (ref 9–35)

## 2017-10-28 MED ORDER — HYDROMORPHONE HCL 1 MG/ML IJ SOLN
0.5000 mg | Freq: Four times a day (QID) | INTRAMUSCULAR | Status: DC
  Administered 2017-10-29 – 2017-10-31 (×9): 0.5 mg via INTRAVENOUS
  Filled 2017-10-28 (×8): qty 1

## 2017-10-28 MED ORDER — ACETAMINOPHEN 650 MG RE SUPP: 650.0000 mg | Freq: Four times a day (QID) | RECTAL | Status: DC | PRN

## 2017-10-28 MED ORDER — GLYCOPYRROLATE 0.2 MG/ML IJ SOLN: 0.2000 mg | INTRAMUSCULAR | Status: DC | PRN

## 2017-10-28 MED ORDER — LORAZEPAM 2 MG/ML IJ SOLN: 1.0000 mg | INTRAMUSCULAR | Status: DC | PRN

## 2017-10-28 MED ORDER — METHOCARBAMOL 1000 MG/10ML IJ SOLN
500.0000 mg | Freq: Three times a day (TID) | INTRAVENOUS | Status: DC | PRN
  Filled 2017-10-28: qty 5

## 2017-10-28 MED ORDER — HYDROMORPHONE HCL 1 MG/ML IJ SOLN
0.5000 mg | INTRAMUSCULAR | Status: DC | PRN
  Administered 2017-10-28 – 2017-10-30 (×3): 0.5 mg via INTRAVENOUS
  Filled 2017-10-28 (×4): qty 1

## 2017-10-28 MED ORDER — MORPHINE SULFATE (PF) 2 MG/ML IV SOLN: 1.0000 mg | INTRAVENOUS | Status: DC | PRN

## 2017-10-28 NOTE — Progress Notes (Addendum)
Palliative Medicine RN Note: Patient is in bed, groaning and grimacing; RN will bring her morphine.  Family at bedside. After meeting with oncologist last night, they have decided that they want hospice. Discussed care options for hospice (home, nursing facility, inpatient).   Sharon [name] has not eaten more than ice chips in several days. She is having pain requiring medication adjustments. She has no options for curative care, and the goal is pure comfort. She is not on any IV fluids. She has metastatic urothelial cancer with bone (spine, pelvis) and liver mets, cirrhosis, multiorgan dysfunction, HTN, hypothyroidism, anemia, bilateral hydronephrosis, bilateral pleural effusions. Per discussions with Dr Hilma Favors, prognosis is likely days to a week. She gave orders to adjust medications to ensure comfort pending hospice placement. Morphine was changed to hydromorphone due to creatinine over 3.  Family would like to pursue inpatient hospice. They have experience with [facility] of [facility] in [place], and they would like to use that facility. I update SW [place] on plan; also paged Dr Rodena Piety.  Sharon Skiff Elwanda Moger, RN, BSN, [facility] 10/28/2017 10:58 AM Cell [phone] 8:00-4:00 Monday-Friday Office [phone]

## 2017-10-28 NOTE — Progress Notes (Signed)
Hypoglycemic Event  CBG: 69  Treatment: D50 IV 25 mL  Symptoms: Shaky  Follow-up CBG: [id number] CBG Result:107  Possible Reasons for Event: Inadequate meal intake  Comments/MD notified:n/a    Silvestre Mines [name]

## 2017-10-28 NOTE — Social Work (Signed)
CSW received call from Soyla Dryer, [phone], RN hospital liaison for [facility] indicating that they do not have a bed today for patient and will probably have one tomorrow. CSW will f/u.  Elissa Hefty, LCSW Clinical Social Worker [phone]

## 2017-10-28 NOTE — Progress Notes (Signed)
Physical Therapy Discharge Patient Details Name: Sharon [name] MRN: [id number] DOB: 1932-09-15 Today's Date: 10/28/2017 Time:  -     Patient discharged from PT services secondary to medical decline - will need to re-order PT to resume therapy services.  Please see latest therapy progress note for current level of functioning and progress toward goals.    Progress and discharge plan discussed with patient and/or caregiver: Patient/Caregiver agrees with plan.  Benjiman Core, PTA Pager [phone] Acute Rehab     Allena Katz 10/28/2017, 2:07 PM

## 2017-10-28 NOTE — Progress Notes (Signed)
PROGRESS NOTE    Sharon [name]  JPV:[id number] DOB: 1932-06-27 DOA: 10/21/2017 PCP: Ann Held, DO   Brief Narrative: 83 year old female who presented with decreased urine output. Patient does haveasignificant pastmedicalhistory for hypertension, hypothyroidism, iron deficiency anemiaandrecently diagnosed metastatic urothelial cancer,status post bilateral ureter stent placements due to obstructive uropathy(discharged 1 week prior to hospitalization).Patient was noted to have a drastic decrease in urine output, associated with worsening lower extremity edema and ARF. Sodium 119, potassium 6.0, chloride 84, bicarbonate 16, glucose 125, BUN95, creatinine 8.39,calcium 6.2. Urinalysis with too numerous, RBCs, and too numerous to count white cells.Chest x-ray with right rotation, bilateral pleural effusions more rightthanleft.CT of the abdomen with multiple lesions throughout the liver most suggestive of metastatic disease, extensive bony metastatic disease in the spine and pelvis, mild bilateral hydronephrosis right greater than left, large bilateral pleural effusions. Patient was admitted tothehospital with the working diagnosis acute obstructive uropathy, complicated with uremia, metabolic acidosis,hyperkalemia and hyponatremia.  Today, met pt with husband, who reported pt ate a little bit. Still noted to be doing poorly, very lethargic, ill appearing, denied any chest pain, worsening shortness of breath, abdominal pain, nausea/vomiting/diarrhea/constipation, fever/chills.    Assessment & Plan:   Principal Problem:   Uremia Active Problems:   Hypothyroidism   Essential hypertension   Iron deficiency anemia   Hyponatremia   Hyperkalemia   Metabolic acidosis   Hypocalcemia   Urothelial cancer (HCC)   Acute renal failure (HCC)   Palliative care by specialist   Goals of care, counseling/discussion   Advance care planning   Pleural effusion #Acute obstructive  uropathy with acute kidney injurywith hyponatremia, hyperkalemia and metabolic acidosis Ongoing Serum cr down to 2.87, bicarb at 19, slow to improve renal fxn Urine output over last 24 hours at 400cc Responding ok to isotonic saline boluses Patient edematous. Very poor prognosis Palliative team on board, meeting with family on 10/27/17, awaiting further recs Nephrology signed off Daily BMP  #Metastatic urothelial cancerbilateral ureteral stents obstructed, sp right nephrostomy tube. Followed by Urology Currently due to severe deconditioning, recent atrial fibrillation and recurrent hypotension, will prefer to hold on invasive procedures. Liver metastatic disease -Palliative team on board, meeting for [place] with family on 10/27/17, family planning on going the hospice route, but would like to speak with oncologist for further recommendation -Spoke to Oncologist Dr Zola Button 12/13, who will review pt chart and give family a call or come to see pt hopefully 10/28/17 for further recommendation  #Elevated liver enzymes Likely due to metastatic disease AST 465, ALT 158, ALP 376, T.bili 1.6 Ammonia 55 Give lactulose Poor prognosis     DVT prophylaxis: SCD Code Status: DNR Family Communication: Discussed with son on the phone Disposition Plan: Patient to be discharged tomorrow to inpatient hospice family is aware and agree.  Appreciate palliative care input. Consultants:  Interventional radiology, nephrology, urology Procedures: Right percutaneous nephrostomy tube placement Antimicrobials: None Subjective:in bed  eyes closed  Objective: Patient in bed somewhat restless refusing medications. Vitals:   10/27/17 1320 10/27/17 1453 10/27/17 2124 10/28/17 0507  BP: (!) 90/48 (!) 98/44 (!) 102/46 (!) 104/46  Pulse: 94 93 97 97  Resp:  '16 18 18  ' Temp:  98 F (36.7 C) 98.3 F (36.8 C) 97.6 F (36.4 C)  TempSrc:  Axillary Oral Oral  SpO2:  93% 91% 90%  Weight:      Height:          Intake/Output Summary (Last 24 hours) at 10/28/2017 1504 Last  data filed at 10/28/2017 0944 Gross per 24 hour  Intake 20 ml  Output 375 ml  Net -355 ml   Filed Weights   10/25/17 0429 10/26/17 0655 10/27/17 0500  Weight: 69.1 kg (152 lb 5.4 oz) 68.5 kg (151 lb 0.2 oz) 68.5 kg (151 lb 0.2 oz)    Examination:  General exam: Appears calm and comfortable  Respiratory system: Clear to auscultation. Respiratory effort normal. Cardiovascular system: S1 & S2 heard, RRR. No JVD, murmurs, rubs, gallops or clicks. No pedal edema. Gastrointestinal system: Abdomen is nondistended, soft and nontender. No organomegaly or masses felt. Normal bowel sounds heard. Central nervous system: Alert and oriented. No focal neurological deficits. Extremities: Symmetric 5 x 5 power. Skin: No rashes, lesions or ulcers Psychiatry: Judgement and insight appear normal. Mood & affect appropriate.     Data Reviewed: I have personally reviewed following labs and imaging studies  CBC: Recent Labs  Lab 10/24/17 0747 10/25/17 0210 10/26/17 0535 10/27/17 0516 10/28/17 0326  WBC 9.4 11.5* 10.7* 11.4* 12.3*  NEUTROABS  --   --   --   --  10.5*  HGB 8.2* 9.0* 8.6* 9.0* 8.7*  HCT 24.1* 27.6* 25.9* 27.2* 26.8*  MCV 79.3 81.2 80.7 81.2 81.7  PLT 138* 123* 91* 90* 87*   Basic Metabolic Panel: Recent Labs  Lab 10/23/17 0219 10/24/17 0200 10/25/17 0210 10/26/17 0535 10/27/17 0516 10/28/17 0326  NA 131* 130* 131* 133* 135 136  K 3.9 3.7 3.8 3.9 3.8 4.4  CL 98* 97* 99* 100* 100* 103  CO2 17* 19* 16* 17* 19* 20*  GLUCOSE 62* 83 69 93 66 67  BUN 79* 82* 81* 86* 86* 87*  CREATININE 4.15* 3.65* 3.34* 3.20* 2.87* 3.12*  CALCIUM 7.9* 7.7* 8.1* 8.1* 8.2* 8.2*  PHOS 4.8* 4.4 4.3  --  3.3 3.4   GFR: Estimated Creatinine Clearance: 12.7 mL/min (A) (by C-G formula based on SCr of 3.12 mg/dL (H)). Liver Function Tests: Recent Labs  Lab 10/24/17 0200 10/25/17 0210 10/26/17 0535 10/27/17 0516  10/28/17 0326  AST  --   --  465*  --   --   ALT  --   --  158*  --   --   ALKPHOS  --   --  376*  --   --   BILITOT  --   --  1.6*  --   --   PROT  --   --  4.8*  --   --   ALBUMIN 1.7* 1.9* 1.8* 1.8* 1.8*   No results for input(s): LIPASE, AMYLASE in the last 168 hours. Recent Labs  Lab 10/26/17 0535 10/27/17 0516 10/28/17 0326  AMMONIA 55* 46* 46*   Coagulation Profile: No results for input(s): INR, PROTIME in the last 168 hours. Cardiac Enzymes: No results for input(s): CKTOTAL, CKMB, CKMBINDEX, TROPONINI in the last 168 hours. BNP (last 3 results) No results for input(s): PROBNP in the last 8760 hours. HbA1C: No results for input(s): HGBA1C in the last 72 hours. CBG: Recent Labs  Lab 10/27/17 0756 10/27/17 0945 10/27/17 1834 10/28/17 0751 10/28/17 0918  GLUCAP 64* 81 78 69 107*   Lipid Profile: No results for input(s): CHOL, HDL, LDLCALC, TRIG, CHOLHDL, LDLDIRECT in the last 72 hours. Thyroid Function Tests: No results for input(s): TSH, T4TOTAL, FREET4, T3FREE, THYROIDAB in the last 72 hours. Anemia Panel: No results for input(s): VITAMINB12, FOLATE, FERRITIN, TIBC, IRON, RETICCTPCT in the last 72 hours. Sepsis Labs: No results for input(s): PROCALCITON, LATICACIDVEN  in the last 168 hours.  No results found for this or any previous visit (from the past 240 hour(s)).       Radiology Studies: No results found.      Scheduled Meds: .  HYDROmorphone (DILAUDID) injection  0.5 mg Intravenous Q6H   Continuous Infusions: . methocarbamol (ROBAXIN)  IV       LOS: 13 days     Georgette Shell, MD [facility]   If 7PM-7AM, please contact night-coverage [email or web address] Password [email or web address] 10/28/2017, 3:04 PM

## 2017-10-28 NOTE — Social Work (Signed)
CSW was advised that patient family amenable to inpatient hospice at [facility]. CSW called and confirmed referral has been received and [place] Shi is the hospital liaison assigned to patient.  CSW will continue to follow.  Elissa Hefty, LCSW Clinical Social Worker [phone]

## 2017-10-29 LAB — GLUCOSE, CAPILLARY
Glucose-Capillary: 66 mg/dL (ref 65–99)
Glucose-Capillary: 107 mg/dL — ABNORMAL HIGH (ref 65–99)

## 2017-10-29 MED ORDER — DEXTROSE 50 % IV SOLN
INTRAVENOUS | Status: AC
  Administered 2017-10-29: 25 g via INTRAVENOUS
  Filled 2017-10-29: qty 50

## 2017-10-29 NOTE — Progress Notes (Signed)
Hypoglycemic Event  CBG: 66  Treatment: D50 IV 25 mL  Symptoms: None  Follow-up CBG: Time: 2536 CBG Result:107  Possible Reasons for Event: Inadequate meal intake    Sharon [name]

## 2017-10-29 NOTE — Progress Notes (Signed)
PROGRESS NOTE  Sharon [name] RSW:[id number] DOB: July 30, 1932 DOA: 11/13/2017 PCP: Ann Held, DO  HPI/Recap of past 47 hours: 83 year old female who presented with decreased urine output. Patient does haveasignificant pastmedicalhistory for hypertension, hypothyroidism, iron deficiency anemiaandrecently diagnosed metastatic urothelial cancer,status post bilateral ureter stent placements due to obstructive uropathy(discharged 1 week prior to hospitalization).Patient was noted to have a drastic decrease in urine output, associated with worsening lower extremity edema and ARF. Sodium 119, potassium 6.0, chloride 84, bicarbonate 16, glucose 125, BUN95, creatinine 8.39,calcium 6.2. Urinalysis with too numerous, RBCs, and too numerous to count white cells.Chest x-ray with right rotation, bilateral pleural effusions more rightthanleft.CT of the abdomen with multiple lesions throughout the liver most suggestive of metastatic disease, extensive bony metastatic disease in the spine and pelvis, mild bilateral hydronephrosis right greater than left, large bilateral pleural effusions. Patient was admitted tothehospital with the working diagnosis acute obstructive uropathy, complicated with uremia, metabolic acidosis,hyperkalemia and hyponatremia.  Today, met pt sleeping, arousable, withdraws to pain, doing poorly, very lethargic, ill appearing, poor prognosis, unable to ROS. Poor oral intake. Transitioned to hospice care   Assessment/Plan: Principal Problem:   Uremia Active Problems:   Hypothyroidism   Essential hypertension   Iron deficiency anemia   Hyponatremia   Hyperkalemia   Metabolic acidosis   Hypocalcemia   Urothelial cancer (HCC)   Acute renal failure (HCC)   Palliative care by specialist   Goals of care, counseling/discussion   Advance care planning   Pleural effusion  #Acute obstructive uropathy with acute kidney injurywith hyponatremia, hyperkalemia and  metabolic acidosis Ongoing, currently hospice Slow to improve renal fxn Patient edematous. Very poor prognosis Palliative team on board, awaiting placement for hospice Nephrology signed off  #Metastatic urothelial cancerbilateral ureteral stents obstructed, sp right nephrostomy tube.  Followed by Urology Currently due to severe deconditioning, recent atrial fibrillation and recurrent hypotension, will prefer to hold on invasive procedures. Liver metastatic disease -Dr Alen Blew on consult: Not a candidate for any palliative systemic therapy. Pt has multiorgan dysfunction with poor performance status in the setting of stage IV cancer. Recommend proceeding with palliative care and hospice moving forward -Palliative team on board, currently on hospice  #Elevated liver enzymes, liver failure Likely due to metastatic disease AST 465, ALT 158, ALP 376, T.bili 1.6 Poor prognosis on hospice  #Hypotension Ongoing, hospice  #New onset paroxysmal atrial fibrillation with rapid ventricular response Sinus rhythm, Rate controlled  #Hypothyroidism D/C levothyroxine, hospice  #Iron deficiency anemia. HB stable  #Transitory lower GI bleeding No further bleeding    Code Status: DNR  Family Communication: None at bedside  Disposition Plan: Palliative team on board, awaiting hospice placement   Consultants:  IR  Urology  Nephrology  Procedures:  IR R percutaneous nephrostomy tube placement  Antimicrobials:  None  DVT prophylaxis:  SCDs due to bleeding   Objective: Vitals:   10/28/17 0507 10/29/17 0450 10/29/17 0500 10/29/17 1434  BP: (!) 104/46 (!) 84/42  (!) 79/41  Pulse: 97 87  85  Resp: 18 (!) _0 Temp: 97.6 F (36.4 C) 98 F (36.7 C)  (!) 97.4 F (36.3 C)  TempSrc: Oral Oral  Oral  SpO2: 90% 90%  (!) 83%  Weight:      Height:        Intake/Output Summary (Last 24 hours) at 10/29/2017 1604 Last data filed at 10/29/2017 1517 Gross per 24 hour    Intake 0 ml  Output 201 ml  Net -201 ml  Filed Weights   10/25/17 0429 10/26/17 0655 10/27/17 0500  Weight: 69.1 kg (152 lb 5.4 oz) 68.5 kg (151 lb 0.2 oz) 68.5 kg (151 lb 0.2 oz)    Exam:   General: Sleepy, lethargic, ill looking appearance, deconditioned  Cardiovascular: S1-S2 present, no added heart sounds  Respiratory: Decreased breath sounds bilaterally at the base, poor inspiratory effort  Abdomen: Soft, nontender, mild distention, bowel sounds present  Musculoskeletal: 3+ pitting LE edema bilaterally  Skin: Normal  Psychiatry: Unable to assess   Data Reviewed: CBC: Recent Labs  Lab 10/24/17 0747 10/25/17 0210 10/26/17 0535 10/27/17 0516 10/28/17 0326  WBC 9.4 11.5* 10.7* 11.4* 12.3*  NEUTROABS  --   --   --   --  10.5*  HGB 8.2* 9.0* 8.6* 9.0* 8.7*  HCT 24.1* 27.6* 25.9* 27.2* 26.8*  MCV 79.3 81.2 80.7 81.2 81.7  PLT 138* 123* 91* 90* 87*   Basic Metabolic Panel: Recent Labs  Lab 10/23/17 0219 10/24/17 0200 10/25/17 0210 10/26/17 0535 10/27/17 0516 10/28/17 0326  NA 131* 130* 131* 133* 135 136  K 3.9 3.7 3.8 3.9 3.8 4.4  CL 98* 97* 99* 100* 100* 103  CO2 17* 19* 16* 17* 19* 20*  GLUCOSE 62* 83 69 93 66 67  BUN 79* 82* 81* 86* 86* 87*  CREATININE 4.15* 3.65* 3.34* 3.20* 2.87* 3.12*  CALCIUM 7.9* 7.7* 8.1* 8.1* 8.2* 8.2*  PHOS 4.8* 4.4 4.3  --  3.3 3.4   GFR: Estimated Creatinine Clearance: 12.7 mL/min (A) (by C-G formula based on SCr of 3.12 mg/dL (H)). Liver Function Tests: Recent Labs  Lab 10/24/17 0200 10/25/17 0210 10/26/17 0535 10/27/17 0516 10/28/17 0326  AST  --   --  465*  --   --   ALT  --   --  158*  --   --   ALKPHOS  --   --  376*  --   --   BILITOT  --   --  1.6*  --   --   PROT  --   --  4.8*  --   --   ALBUMIN 1.7* 1.9* 1.8* 1.8* 1.8*   No results for input(s): LIPASE, AMYLASE in the last 168 hours. Recent Labs  Lab 10/26/17 0535 10/27/17 0516 10/28/17 0326  AMMONIA 55* 46* 46*   Coagulation Profile: No  results for input(s): INR, PROTIME in the last 168 hours. Cardiac Enzymes: No results for input(s): CKTOTAL, CKMB, CKMBINDEX, TROPONINI in the last 168 hours. BNP (last 3 results) No results for input(s): PROBNP in the last 8760 hours. HbA1C: No results for input(s): HGBA1C in the last 72 hours. CBG: Recent Labs  Lab 10/28/17 0751 10/28/17 0918 10/28/17 1810 10/29/17 0756 10/29/17 0859  GLUCAP 69 107* 74 66 107*   Lipid Profile: No results for input(s): CHOL, HDL, LDLCALC, TRIG, CHOLHDL, LDLDIRECT in the last 72 hours. Thyroid Function Tests: No results for input(s): TSH, T4TOTAL, FREET4, T3FREE, THYROIDAB in the last 72 hours. Anemia Panel: No results for input(s): VITAMINB12, FOLATE, FERRITIN, TIBC, IRON, RETICCTPCT in the last 72 hours. Urine analysis:    Component Value Date/Time   COLORURINE RED (A) 10/16/2017 1053   APPEARANCEUR TURBID (A) 10/16/2017 1053   LABSPEC  10/16/2017 1053    TEST NOT REPORTED DUE TO COLOR INTERFERENCE OF URINE PIGMENT   PHURINE  10/16/2017 1053    TEST NOT REPORTED DUE TO COLOR INTERFERENCE OF URINE PIGMENT   GLUCOSEU (A) 10/16/2017 1053    TEST NOT REPORTED  DUE TO COLOR INTERFERENCE OF URINE PIGMENT   HGBUR (A) 10/16/2017 1053    TEST NOT REPORTED DUE TO COLOR INTERFERENCE OF URINE PIGMENT   HGBUR negative 08/24/2010 0824   BILIRUBINUR (A) 10/16/2017 1053    TEST NOT REPORTED DUE TO COLOR INTERFERENCE OF URINE PIGMENT   BILIRUBINUR Neg 09/25/2012 1453   KETONESUR (A) 10/16/2017 1053    TEST NOT REPORTED DUE TO COLOR INTERFERENCE OF URINE PIGMENT   PROTEINUR (A) 10/16/2017 1053    TEST NOT REPORTED DUE TO COLOR INTERFERENCE OF URINE PIGMENT   UROBILINOGEN 0.2 09/25/2012 1453   UROBILINOGEN 0.2 08/24/2010 0824   NITRITE (A) 10/16/2017 1053    TEST NOT REPORTED DUE TO COLOR INTERFERENCE OF URINE PIGMENT   LEUKOCYTESUR (A) 10/16/2017 1053    TEST NOT REPORTED DUE TO COLOR INTERFERENCE OF URINE PIGMENT   Sepsis  Labs: _0 (procalcitonin:4,lacticidven:4)  ) No results found for this or any previous visit (from the past 240 hour(s)).    Studies: No results found.  Scheduled Meds: .  HYDROmorphone (DILAUDID) injection  0.5 mg Intravenous Q6H    Continuous Infusions: . methocarbamol (ROBAXIN)  IV       LOS: 14 days     Alma Friendly, MD [facility]   If 7PM-7AM, please contact night-coverage [email or web address] Password [email or web address] 10/29/2017, 4:04 PM

## 2017-10-30 LAB — GLUCOSE, CAPILLARY
GLUCOSE-CAPILLARY: 50 mg/dL — AB (ref 65–99)
Glucose-Capillary: 112 mg/dL — ABNORMAL HIGH (ref 65–99)

## 2017-10-30 MED ORDER — DEXTROSE 50 % IV SOLN
INTRAVENOUS | Status: AC
  Administered 2017-10-30: 25 mL
  Filled 2017-10-30: qty 50

## 2017-10-30 NOTE — Progress Notes (Signed)
PROGRESS NOTE  Sharon [name] LSL:[id number] DOB: 1932/10/28 DOA: 11/13/2017 PCP: Ann Held, DO  HPI/Recap of past 45 hours: 83 year old female who presented with decreased urine output. Patient does haveasignificant pastmedicalhistory for hypertension, hypothyroidism, iron deficiency anemiaandrecently diagnosed metastatic urothelial cancer,status post bilateral ureter stent placements due to obstructive uropathy(discharged 1 week prior to hospitalization).Patient was noted to have a drastic decrease in urine output, associated with worsening lower extremity edema and ARF. Sodium 119, potassium 6.0, chloride 84, bicarbonate 16, glucose 125, BUN95, creatinine 8.39,calcium 6.2. Urinalysis with too numerous, RBCs, and too numerous to count white cells.Chest x-ray with right rotation, bilateral pleural effusions more rightthanleft.CT of the abdomen with multiple lesions throughout the liver most suggestive of metastatic disease, extensive bony metastatic disease in the spine and pelvis, mild bilateral hydronephrosis right greater than left, large bilateral pleural effusions. Patient was admitted tothehospital with the working diagnosis acute obstructive uropathy, complicated with uremia, metabolic acidosis,hyperkalemia and hyponatremia.  Today, met pt sleeping, arousable, withdraws to pain, doing poorly, very lethargic, ill appearing, poor prognosis, unable to ROS. Poor oral intake. Transitioned to hospice care.   Assessment/Plan: Principal Problem:   Uremia Active Problems:   Hypothyroidism   Essential hypertension   Iron deficiency anemia   Hyponatremia   Hyperkalemia   Metabolic acidosis   Hypocalcemia   Urothelial cancer (HCC)   Acute renal failure (HCC)   Palliative care by specialist   Goals of care, counseling/discussion   Advance care planning   Pleural effusion  #Acute obstructive uropathy with acute kidney injurywith hyponatremia, hyperkalemia  and metabolic acidosis Ongoing, currently hospice Slow to improve renal fxn Patient edematous. Very poor prognosis Palliative team on board, awaiting placement for hospice Nephrology signed off  #Metastatic urothelial cancerbilateral ureteral stents obstructed, sp right nephrostomy tube.  Followed by Urology Currently due to severe deconditioning, recent atrial fibrillation and recurrent hypotension, will prefer to hold on invasive procedures. Liver metastatic disease -Dr Alen Blew on consult: Not a candidate for any palliative systemic therapy. Pt has multiorgan dysfunction with poor performance status in the setting of stage IV cancer. Recommend proceeding with palliative care and hospice moving forward -Palliative team on board, currently on hospice  #Elevated liver enzymes, liver failure Likely due to metastatic disease AST 465, ALT 158, ALP 376, T.bili 1.6 Poor prognosis on hospice  #Hypotension Ongoing, hospice  #New onset paroxysmal atrial fibrillation with rapid ventricular response Sinus rhythm, Rate controlled  #Hypothyroidism D/C levothyroxine, hospice  #Iron deficiency anemia. HB stable  #Transitory lower GI bleeding No further bleeding    Code Status: DNR  Family Communication: None at bedside  Disposition Plan: Palliative team on board, awaiting hospice placement   Consultants:  IR  Urology  Nephrology  Procedures:  IR R percutaneous nephrostomy tube placement  Antimicrobials:  None  DVT prophylaxis:  SCDs due to bleeding   Objective: Vitals:   10/29/17 1434 10/29/17 2138 10/29/17 2145 10/30/17 0525  BP: (!) 79/41 (!) 73/30  (!) 70/50  Pulse: 85 82  81  Resp: _0 Temp: (!) 97.4 F (36.3 C) (!) 97.5 F (36.4 C)  97.7 F (36.5 C)  TempSrc: Oral Oral  Oral  SpO2: (!) 83% (!) 78% 91% 91%  Weight:      Height:        Intake/Output Summary (Last 24 hours) at 10/30/2017 1149 Last data filed at 10/30/2017 0918 Gross per  24 hour  Intake 175 ml  Output -  Net 175 ml  Filed Weights   10/25/17 0429 10/26/17 0655 10/27/17 0500  Weight: 69.1 kg (152 lb 5.4 oz) 68.5 kg (151 lb 0.2 oz) 68.5 kg (151 lb 0.2 oz)    Exam:   General: Sleepy, lethargic, ill looking appearance, deconditioned  Cardiovascular: S1-S2 present, no added heart sounds  Respiratory: Decreased breath sounds bilaterally at the base, poor inspiratory effort  Abdomen: Soft, nontender, mild distention, bowel sounds present  Musculoskeletal: 3+ pitting LE edema bilaterally  Skin: Normal  Psychiatry: Unable to assess   Data Reviewed: CBC: Recent Labs  Lab 10/24/17 0747 10/25/17 0210 10/26/17 0535 10/27/17 0516 10/28/17 0326  WBC 9.4 11.5* 10.7* 11.4* 12.3*  NEUTROABS  --   --   --   --  10.5*  HGB 8.2* 9.0* 8.6* 9.0* 8.7*  HCT 24.1* 27.6* 25.9* 27.2* 26.8*  MCV 79.3 81.2 80.7 81.2 81.7  PLT 138* 123* 91* 90* 87*   Basic Metabolic Panel: Recent Labs  Lab 10/24/17 0200 10/25/17 0210 10/26/17 0535 10/27/17 0516 10/28/17 0326  NA 130* 131* 133* 135 136  K 3.7 3.8 3.9 3.8 4.4  CL 97* 99* 100* 100* 103  CO2 19* 16* 17* 19* 20*  GLUCOSE 83 69 93 66 67  BUN 82* 81* 86* 86* 87*  CREATININE 3.65* 3.34* 3.20* 2.87* 3.12*  CALCIUM 7.7* 8.1* 8.1* 8.2* 8.2*  PHOS 4.4 4.3  --  3.3 3.4   GFR: Estimated Creatinine Clearance: 12.7 mL/min (A) (by C-G formula based on SCr of 3.12 mg/dL (H)). Liver Function Tests: Recent Labs  Lab 10/24/17 0200 10/25/17 0210 10/26/17 0535 10/27/17 0516 10/28/17 0326  AST  --   --  465*  --   --   ALT  --   --  158*  --   --   ALKPHOS  --   --  376*  --   --   BILITOT  --   --  1.6*  --   --   PROT  --   --  4.8*  --   --   ALBUMIN 1.7* 1.9* 1.8* 1.8* 1.8*   No results for input(s): LIPASE, AMYLASE in the last 168 hours. Recent Labs  Lab 10/26/17 0535 10/27/17 0516 10/28/17 0326  AMMONIA 55* 46* 46*   Coagulation Profile: No results for input(s): INR, PROTIME in the last 168  hours. Cardiac Enzymes: No results for input(s): CKTOTAL, CKMB, CKMBINDEX, TROPONINI in the last 168 hours. BNP (last 3 results) No results for input(s): PROBNP in the last 8760 hours. HbA1C: No results for input(s): HGBA1C in the last 72 hours. CBG: Recent Labs  Lab 10/28/17 1810 10/29/17 0756 10/29/17 0859 10/30/17 0824 10/30/17 0914  GLUCAP 74 66 107* 50* 112*   Lipid Profile: No results for input(s): CHOL, HDL, LDLCALC, TRIG, CHOLHDL, LDLDIRECT in the last 72 hours. Thyroid Function Tests: No results for input(s): TSH, T4TOTAL, FREET4, T3FREE, THYROIDAB in the last 72 hours. Anemia Panel: No results for input(s): VITAMINB12, FOLATE, FERRITIN, TIBC, IRON, RETICCTPCT in the last 72 hours. Urine analysis:    Component Value Date/Time   COLORURINE RED (A) 10/16/2017 1053   APPEARANCEUR TURBID (A) 10/16/2017 1053   LABSPEC  10/16/2017 1053    TEST NOT REPORTED DUE TO COLOR INTERFERENCE OF URINE PIGMENT   PHURINE  10/16/2017 1053    TEST NOT REPORTED DUE TO COLOR INTERFERENCE OF URINE PIGMENT   GLUCOSEU (A) 10/16/2017 1053    TEST NOT REPORTED DUE TO COLOR INTERFERENCE OF URINE PIGMENT   HGBUR (A)  10/16/2017 1053    TEST NOT REPORTED DUE TO COLOR INTERFERENCE OF URINE PIGMENT   HGBUR negative 08/24/2010 0824   BILIRUBINUR (A) 10/16/2017 1053    TEST NOT REPORTED DUE TO COLOR INTERFERENCE OF URINE PIGMENT   BILIRUBINUR Neg 09/25/2012 1453   KETONESUR (A) 10/16/2017 1053    TEST NOT REPORTED DUE TO COLOR INTERFERENCE OF URINE PIGMENT   PROTEINUR (A) 10/16/2017 1053    TEST NOT REPORTED DUE TO COLOR INTERFERENCE OF URINE PIGMENT   UROBILINOGEN 0.2 09/25/2012 1453   UROBILINOGEN 0.2 08/24/2010 0824   NITRITE (A) 10/16/2017 1053    TEST NOT REPORTED DUE TO COLOR INTERFERENCE OF URINE PIGMENT   LEUKOCYTESUR (A) 10/16/2017 1053    TEST NOT REPORTED DUE TO COLOR INTERFERENCE OF URINE PIGMENT   Sepsis Labs: _0 (procalcitonin:4,lacticidven:4)  ) No results found for  this or any previous visit (from the past 240 hour(s)).    Studies: No results found.  Scheduled Meds: .  HYDROmorphone (DILAUDID) injection  0.5 mg Intravenous Q6H    Continuous Infusions: . methocarbamol (ROBAXIN)  IV       LOS: 15 days     Alma Friendly, MD [facility]   If 7PM-7AM, please contact night-coverage [email or web address] Password [email or web address] 10/30/2017, 11:49 AM

## 2017-10-30 NOTE — Progress Notes (Signed)
CSW received a call from [facility]. There are no beds available today for pt. CSW will follow up.  Reed Breech LCSWA [phone]

## 2017-10-30 NOTE — Progress Notes (Signed)
Hypoglycemic Event  CBG: 50  Treatment: D50 IV 25 mL  Symptoms: None  Follow-up CBG: Time: 0910 CBG Result: 112  Possible Reasons for Event: Inadequate meal intake   MD notified   Richardean Chimera

## 2017-11-15 NOTE — Discharge Summary (Signed)
Discharge Summary  Sharon [name] IHK:[id number] DOB: [birth year]  PCP: Ann Held, DO  Admit date: November 06, 2017 Discharge/death date: 2017-11-22  Time spent: >70mins  Recommendations for Outpatient Follow-up:  None  Discharge Diagnoses:  Active Hospital Problems   Diagnosis Date Noted  . Uremia November 06, 2017  . Advance care planning   . Pleural effusion   . Acute renal failure ([place])   . Palliative care by specialist   . Goals of care, counseling/discussion   . Urothelial cancer ([place]) 10/16/2017  . Iron deficiency anemia Nov 06, 2017  . Hyponatremia 2017-11-06  . Hyperkalemia 11-06-17  . Metabolic acidosis 75/64/3329  . Hypocalcemia 11-06-17  . Essential hypertension 08/19/2008  . Hypothyroidism 12/26/2006    Resolved Hospital Problems  No resolved problems to display.    Discharge Condition: Died  Diet recommendation: None  Vitals:   10/30/17 2100 10/30/17 2113  BP:  (!) 71/33  Pulse:  84  Resp: (!) 5 (!) 6  Temp:  97.6 F (36.4 C)  SpO2:  92%    History of present illness:  84 year old female who presented with decreased urine output. Patient does haveasignificant pastmedicalhistory for hypertension, hypothyroidism, iron deficiency anemiaandrecently diagnosed metastatic urothelial cancer,status post bilateral ureter stent placements due to obstructive uropathy(discharged 1 week prior to hospitalization).Patient was noted to have a drastic decrease in urine output, associated with worsening lower extremity edema and ARF. Sodium 119, potassium 6.0, chloride 84, bicarbonate 16, glucose 125, BUN95, creatinine 8.39,calcium 6.2. Urinalysis with too numerous, RBCs, and too numerous to count white cells.Chest x-ray with right rotation, bilateral pleural effusions more rightthanleft.CT of the abdomen with multiple lesions throughout the liver most suggestive of metastatic disease, extensive bony metastatic disease in the spine and pelvis, mild  bilateral hydronephrosis right greater than left, large bilateral pleural effusions. Patient was admitted tothehospital with the working diagnosis acute obstructive uropathy, complicated with uremia, metabolic acidosis,hyperkalemia and hyponatremia.  Patient progressively declined, was made DNR and transitioned to hospice with comfort care and succumbed to her death on 11/22/17.   Hospital Course:  Principal Problem:   Uremia Active Problems:   Hypothyroidism   Essential hypertension   Iron deficiency anemia   Hyponatremia   Hyperkalemia   Metabolic acidosis   Hypocalcemia   Urothelial cancer (HCC)   Acute renal failure (HCC)   Palliative care by specialist   Goals of care, counseling/discussion   Advance care planning   Pleural effusion  #Acute obstructive uropathy with acute kidney injurywith hyponatremia, hyperkalemia and metabolic acidosis Slow to improve renal fxn Patient edematous. Very poor prognosis  #Metastatic urothelial cancerbilateral ureteral stents obstructed, sp right nephrostomy tube -Dr Alen Blew on consult: Not a candidate for any palliative systemic therapy. Pt has multiorgan dysfunction with poor performance status in the setting of stage IV cancer  #Elevated liver enzymes, liver failure Likely due to metastatic disease  #Hypotension  #New onsetparoxysmalatrial fibrillation with rapid ventricular response  #Hypothyroidism  #Iron deficiency anemia  #Transitory lower GI bleeding   Procedures:  IR R percutaneous nephrostomy tube placement  Consultations:  IR  Urology  Nephrology  Discharge Exam: BP (!) 71/33 (BP Location: Left Arm)   Pulse 84   Temp 97.6 F (36.4 C) (Oral)   Resp (!) 6   Ht 5\' 3"  (1.6 m)   Wt 68.5 kg (151 lb 0.2 oz)   SpO2 92%   BMI 26.75 kg/m   General: No examination  Cardiovascular: No examination  Respiratory: No examination  Discharge Instructions You were cared for by  a hospitalist during your  hospital stay. If you have any questions about your discharge medications or the care you received while you were in the hospital after you are discharged, you can call the unit and asked to speak with the hospitalist on call if the hospitalist that took care of you is not available. Once you are discharged, your primary care physician will handle any further medical issues. Please note that NO REFILLS for any discharge medications will be authorized once you are discharged, as it is imperative that you return to your primary care physician (or establish a relationship with a primary care physician if you do not have one) for your aftercare needs so that they can reassess your need for medications and monitor your lab values.   Allergies as of 11-14-2017   No Known Allergies     Medication List    ASK your doctor about these medications   alendronate 70 MG tablet Commonly known as:  FOSAMAX Take with a full glass of water on an empty stomach.   Calcium Carbonate-Vitamin D 600-200 MG-UNIT Tabs Take 2 tablets by mouth daily.   cholecalciferol 1000 units tablet Commonly known as:  VITAMIN D Take 1,000 Units by mouth daily.   ciprofloxacin 250 MG tablet Commonly known as:  CIPRO Take 1 tablet (250 mg total) by mouth 2 (two) times daily.   cyclobenzaprine 5 MG tablet Commonly known as:  FLEXERIL Take 5 mg by mouth 3 (three) times daily as needed for muscle spasms.   ferrous gluconate 325 MG tablet Commonly known as:  FERGON Take 325 mg by mouth daily with breakfast.   Flax Seed Oil 1000 MG Caps Take 1 capsule by mouth daily.   furosemide 40 MG tablet Commonly known as:  LASIX Take 40 mg by mouth daily.   levothyroxine 25 MCG tablet Commonly known as:  SYNTHROID, LEVOTHROID 1 po qd   lisinopril-hydrochlorothiazide 10-12.5 MG tablet Commonly known as:  PRINZIDE,ZESTORETIC 1 tab by mouth daily--Office visit due now   metoprolol tartrate 25 MG tablet Commonly known as:   LOPRESSOR Take 25 mg by mouth 2 (two) times daily.   multivitamin tablet Take 1 tablet by mouth daily.   vitamin B-12 1000 MCG tablet Commonly known as:  CYANOCOBALAMIN Take 1,000 mcg by mouth daily.   zoster vaccine live (PF) 19400 UNT/0.65ML injection Commonly known as:  ZOSTAVAX Inject 19,400 Units into the skin once.      No Known Allergies Follow-up Information    [facility] Follow up.   Specialty:  [place] Why:  home health services arranged, office will call and set up home visits Contact information: [address] [place] [address] [phone]            The results of significant diagnostics from this hospitalization (including imaging, microbiology, ancillary and laboratory) are listed below for reference.    Significant Diagnostic Studies: Ct Abdomen Pelvis Wo Contrast  Result Date: 10/16/2017 CLINICAL DATA:  Hydronephrosis. History of bilateral renal stents through hydronephrosis. Worsening acute renal failure. EXAM: CT ABDOMEN AND PELVIS WITHOUT CONTRAST TECHNIQUE: Multidetector CT imaging of the abdomen and pelvis was performed following the standard protocol without IV contrast. COMPARISON:  Ultrasound 11/14/2017 FINDINGS: Lower chest: Large bilateral pleural effusions with compressive atelectasis or pneumonia in the lower lobes. Heart is normal size. Hepatobiliary: Numerous ill-defined masses throughout the liver. There are morphologic changes of cirrhosis with nodular contours. Small gallstones layering in the gallbladder. Pancreas: No focal abnormality  or ductal dilatation. Spleen: No focal abnormality.  Normal size. Adrenals/Urinary Tract: Bilateral ureteral stents are in place in the within the bladder. Mild bilateral hydronephrosis, right greater than left. Left kidney is atrophic. Stomach/Bowel: Sigmoid diverticulosis. No active diverticulitis. No evidence of bowel obstruction. Vascular/Lymphatic: Diffuse aortic  and iliac calcifications. No aneurysm or adenopathy. Reproductive: Prior hysterectomy.  No adnexal masses. Other: Small amount of free fluid in the pelvis and around the liver and spleen. No free air. Extensive edema throughout the subcutaneous soft tissues. Musculoskeletal: Extensive sclerotic lesions throughout the thoracic and lumbar spine and sacrum compatible with sclerotic metastases. IMPRESSION: Numerous lesions throughout the liver most suggestive of metastatic disease. Morphologic features of the liver suggests cirrhosis. Extensive bony metastatic disease in the spine and pelvis compatible with sclerotic bone metastases. Mild bilateral hydronephrosis, right greater than left. Left kidney is atrophic. Large bilateral pleural effusions with bilateral lower lobe atelectasis or pneumonia. Cholelithiasis. Sigmoid diverticulosis. Electronically Signed   By: Rolm Baptise M.D.   On: 10/16/2017 15:12   Dg Chest 2 View  Result Date: 10/20/2017 CLINICAL DATA:  Shortness of Breath EXAM: CHEST  2 VIEW COMPARISON:  October 16, 2017 FINDINGS: Pleural effusions are again noted bilaterally, slightly larger on the right than on the left. There is associated bibasilar atelectasis. There is no frank airspace consolidation appreciable. Heart is upper normal in size with pulmonary vascularity within normal limits. No adenopathy. No evident bone lesions. IMPRESSION: Persistent pleural effusions, slightly larger on the right than on the left, with bibasilar atelectasis. Stable cardiac silhouette. Electronically Signed   By: Lowella Grip III M.D.   On: 10/20/2017 14:10   US Renal  Result Date: 10/17/2017 CLINICAL DATA:  Bilateral hydronephrosis EXAM: RENAL / URINARY TRACT ULTRASOUND COMPLETE COMPARISON:  Abdominal CT from yesterday FINDINGS: Right Kidney: Length: 13 cm. Echogenic with moderate hydronephrosis. The known ureteral stent is difficult to visualize. No mass lesion. Left Kidney: Length: 9 cm, which is  overestimated relative to abdominal CT from yesterday (8cm). Echogenic cortex with diffuse cortical thinning. Mild hydronephrosis. Known ureteral stent is difficult to visualize. Bladder: Decompressed. Trace ascites.  Known pleural effusions. IMPRESSION: 1. Moderate right and mild left hydronephrosis that is stable from CT yesterday. 2. Decompressed urinary bladder. 3. Chronic medical renal disease with asymmetric left renal atrophy. 4. Trace ascites. Electronically Signed   By: Monte Fantasia M.D.   On: 10/17/2017 08:35   US Renal  Result Date: 10/28/2017 CLINICAL DATA:  Renal failure. EXAM: RENAL / URINARY TRACT ULTRASOUND COMPLETE COMPARISON:  None. FINDINGS: Right Kidney: Length: 13.2 cm. Rounded regions of fluid in the left kidney are favored to represent caliectasis. Renal cysts considered less likely. No pelviectasis. No solid mass noted. Left Kidney: Length: 11.5 cm. The left kidney is not well visualized or characterized. Bladder: Decompressed. IMPRESSION: 1. Probable caliectasis in the right kidney. Renal cysts are considered less likely. No gross hydronephrosis with no pelviectasis. 2. The left kidney is not well visualized. 3. The bladder is poorly evaluated due to lack of distention. 4. A small amount of free fluid is seen in the right upper quadrant and pelvis, nonspecific. Electronically Signed   By: Dorise Bullion III M.D   On: 11/13/2017 21:06   Dg Chest Port 1 View  Result Date: 10/16/2017 CLINICAL DATA:  Acute kidney injury. History of metastatic cancer and ureteral stents. EXAM: PORTABLE CHEST 1 VIEW COMPARISON:  Chest radiograph Mar 17, 2015 FINDINGS: Cardiac silhouette is mildly enlarged. Mediastinal silhouette is nonsuspicious. Moderate RIGHT  and small LEFT pleural effusions with underlying consolidation. No pneumothorax. Soft tissue planes included osseous structures are nonsuspicious. IMPRESSION: Moderate RIGHT and small LEFT pleural effusions with underlying consolidation.  Recommend follow-up chest radiograph after treatment to exclude underlying mass. Mild cardiomegaly. Electronically Signed   By: Elon Alas M.D.   On: 10/16/2017 20:02   Ir Nephrostomy Placement Right  Result Date: 10/17/2017 INDICATION: 84 year old female with a history of urothelial cancer. She has bilateral ureteral stents which were placed at an outside institution. Unfortunately, she has recurrent right-sided hydronephrosis and worsening renal function consistent with obstructed uropathy. Placement of a percutaneous nephrostomy tube is warranted for renal decompression. EXAM: IR NEPHROSTOMY PLACEMENT RIGHT COMPARISON:  None. MEDICATIONS: Ciprofloxacin 400 mg IV; The antibiotic was administered in an appropriate time frame prior to skin puncture. ANESTHESIA/SEDATION: Fentanyl 75 mcg IV; Versed 1.5 mg IV Moderate Sedation Time:  15 minutes The patient was continuously monitored during the procedure by the interventional radiology nurse under my direct supervision. CONTRAST:  15 mL Isovue-300 - administered into the collecting system(s) FLUOROSCOPY TIME:  Fluoroscopy Time: 1 minutes 54 seconds (5 mGy). COMPLICATIONS: None immediate. PROCEDURE: Informed written consent was obtained from the patient after a thorough discussion of the procedural risks, benefits and alternatives. All questions were addressed. Maximal Sterile Barrier Technique was utilized including caps, mask, sterile gowns, sterile gloves, sterile drape, hand hygiene and skin antiseptic. A timeout was performed prior to the initiation of the procedure. The right flank was interrogated with ultrasound. The right kidney is identified and is found to be hydronephrotic. A suitable calyx was selected. Local anesthesia was attained by infiltration with 1% lidocaine. A small dermatotomy was made. Under real-time sonographic guidance, a posterior interpolar calyx was punctured with a 21 gauge Accustick needle. There was return of clear urine. A  hand injection of contrast material through the needle opacifies the renal collecting system. A 0.018 inch wire was advanced into the renal collecting system. The needle was removed. The Accustick sheath was advanced of the renal collecting system. The 0.018 inch wire was then exchanged for a 0.035 inch wire. The skin tract was dilated to 10 [place]. A Cook 10.2 [place] all-purpose drainage catheter was advanced over the wire and formed in the renal pelvis. A gentle hand injection of contrast material confirms placement within the renal pelvis. A fluoroscopic spot image was saved. The catheter was connected to gravity bag drainage and secured to the skin with 0 Prolene suture. The patient tolerated the procedure well. IMPRESSION: Successful placement of a 10 French right-sided percutaneous nephrostomy tube. Patient should return Interventional Radiology in 6- 8 weeks for nephrostomy tube check and exchange. Signed, Criselda Peaches, MD [facility] Electronically Signed   By: Jacqulynn Cadet M.D.   On: 10/17/2017 16:52    Microbiology: No results found for this or any previous visit (from the past 240 hour(s)).   Labs: Basic Metabolic Panel: No results for input(s): NA, K, CL, CO2, GLUCOSE, BUN, CREATININE, CALCIUM, MG, PHOS in the last 168 hours. Liver Function Tests: No results for input(s): AST, ALT, ALKPHOS, BILITOT, PROT, ALBUMIN in the last 168 hours. No results for input(s): LIPASE, AMYLASE in the last 168 hours. No results for input(s): AMMONIA in the last 168 hours. CBC: No results for input(s): WBC, NEUTROABS, HGB, HCT, MCV, PLT in the last 168 hours. Cardiac Enzymes: No results for input(s): CKTOTAL, CKMB, CKMBINDEX, TROPONINI in the last 168 hours. BNP: BNP (last 3 results) No  results for input(s): BNP in the last 8760 hours.  ProBNP (last 3 results) No results for input(s): PROBNP in the last 8760 hours.  CBG: Recent  Labs  Lab 10/28/17 1810 10/29/17 0756 10/29/17 0859 10/30/17 0824 10/30/17 0914  GLUCAP 74 66 107* 50* 112*       Signed:  Alma Friendly, MD [facility] 11/04/2017, 5:17 PM

## 2017-11-15 NOTE — Progress Notes (Signed)
RN came to room to assess patient and noted patient was having agonal respirations.  RN then notified by CCMD that HR had dropped from 70's to 30's on telemetry monitor.  RN called M. Marinus Maw, RN to room.  At 0317 patient's breathing stopped and no heartbeat auscultated by both RNs, CCMD notified RN that patient's heart had stopped.  RN notified Arby Barrette, NP and made him aware that patient had passed away.  RN then notified patient's son [name] about patient's passing and was instructed to release body to the morgue to be taken to [facility] in [place], [place].  [place] also notified.  RN also contacted [facility] Placement to report that body has been released to the morgue.  Patient's belongings are being gathered and taken to the nurses station and [name] will be notified to pick up items. P.J. Linus Mako, RN

## 2017-11-15 DEATH — deceased

## 2018-12-20 IMAGING — CT CT ABD-PELV W/O CM
2 of 4 series · 16 of 46 positions shown, 18 images · non-contrast
Comparison: Ultrasound 10/15/2017

CLINICAL DATA: Hydronephrosis. History of bilateral renal stents
through hydronephrosis. Worsening acute renal failure.

EXAM:
CT ABDOMEN AND PELVIS WITHOUT CONTRAST
TECHNIQUE: Multidetector CT imaging of the abdomen and pelvis was performed
following the standard protocol without IV contrast.

[Series 3: ap without · axial · non-contrast · 0.84mm/px · z∈[+863,+1273]mm · 13 of 94 slices shown, 15 images]
[im 6/94  soft-tissue]
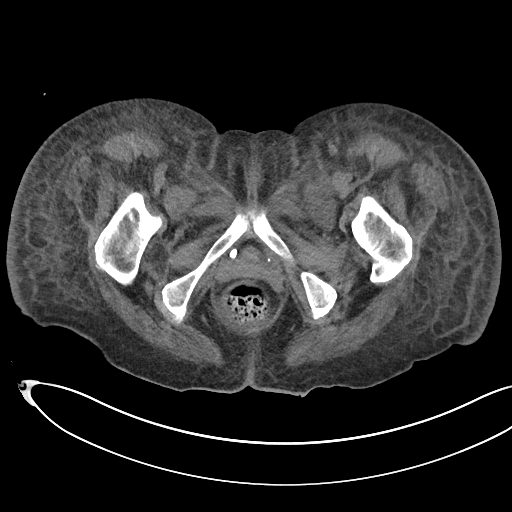
[im 6/94  bone]
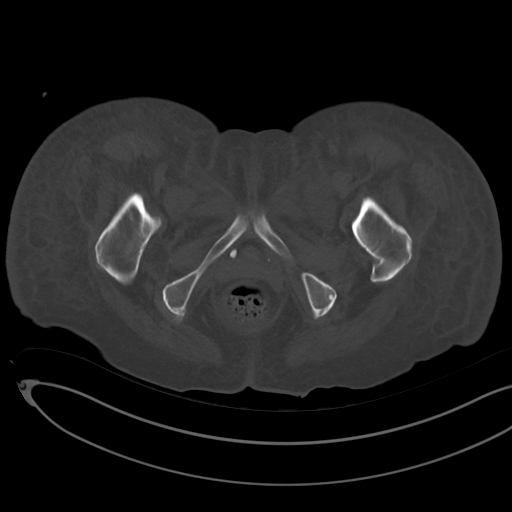
[im 11/94  soft-tissue]
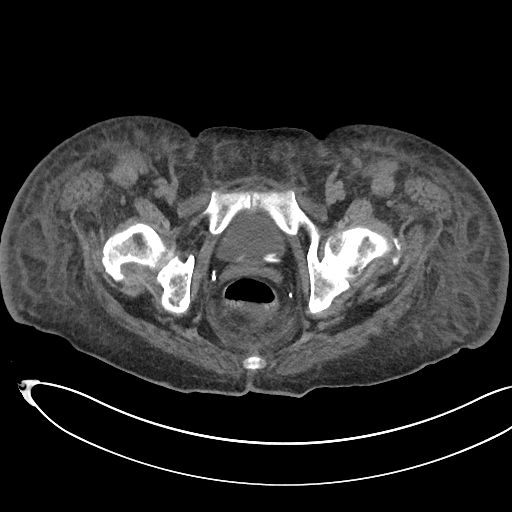
[im 21/94  soft-tissue]
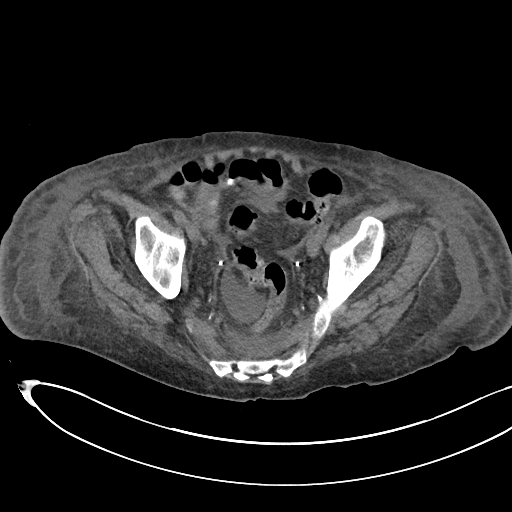
[im 26/94  soft-tissue]
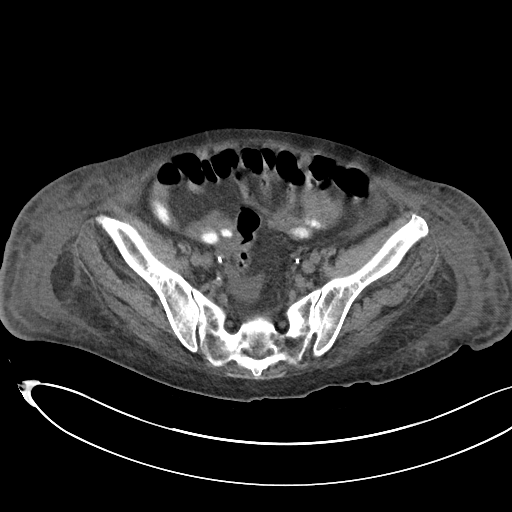
[im 32/94  soft-tissue]
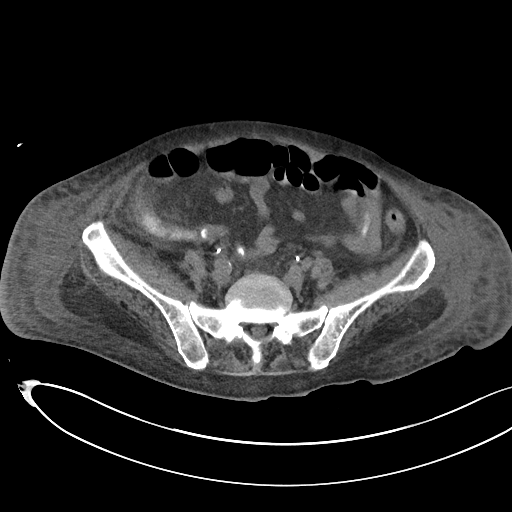
[im 42/94  soft-tissue]
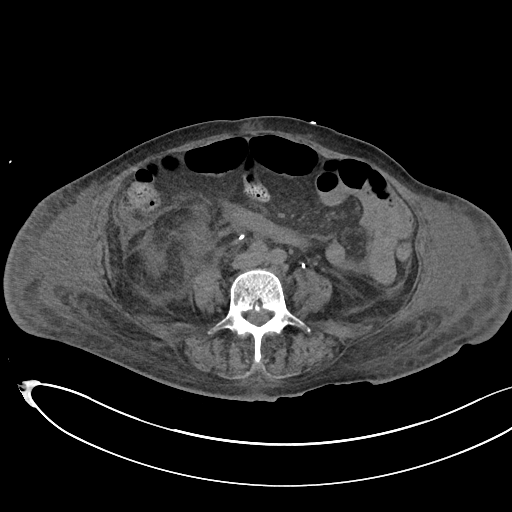
[im 47/94  soft-tissue]
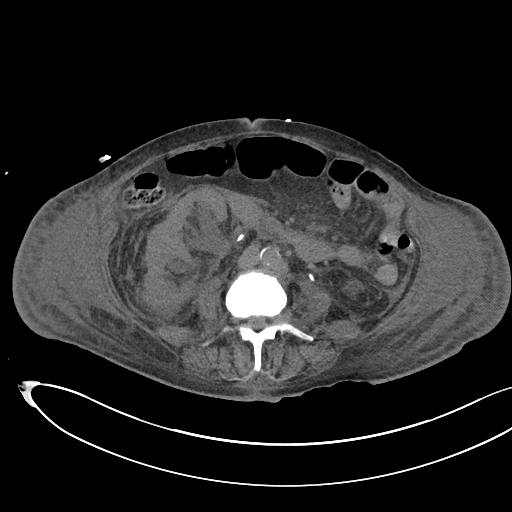
[im 52/94  soft-tissue]
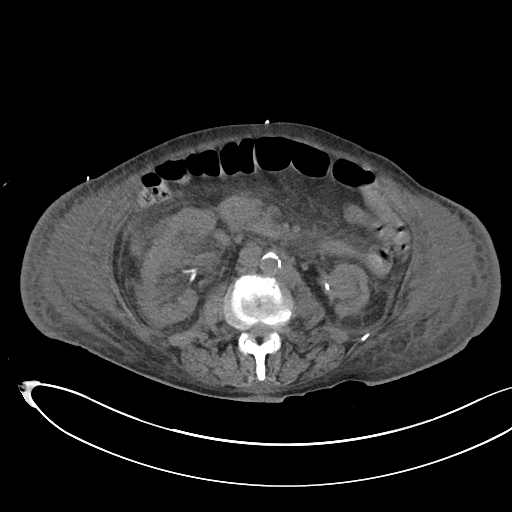
[im 63/94  soft-tissue]
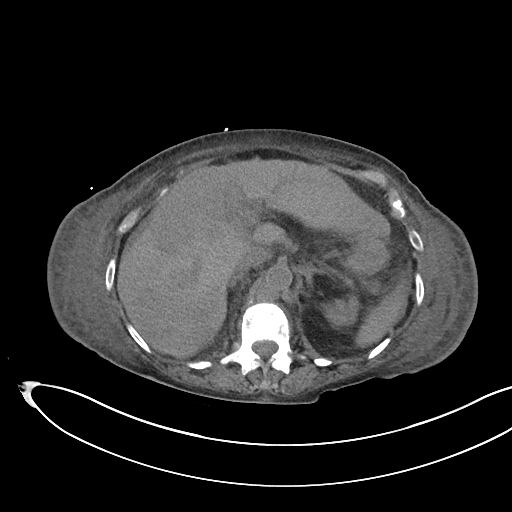
[im 63/94  bone]
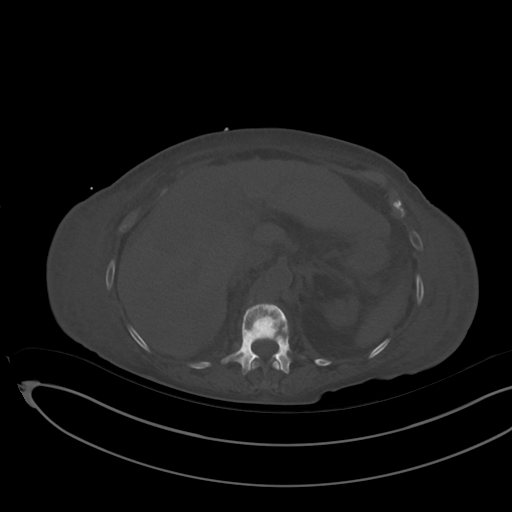
[im 68/94  soft-tissue]
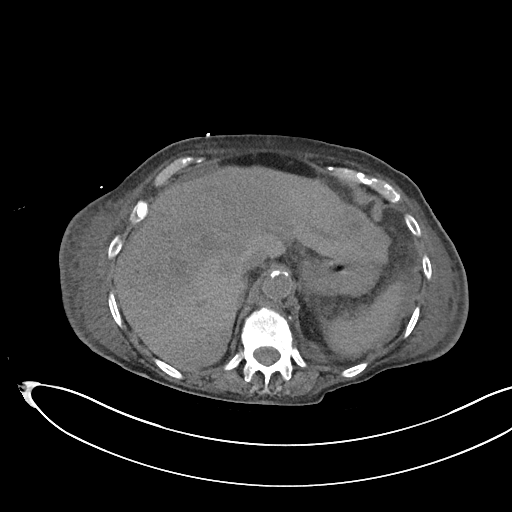
[im 73/94  soft-tissue]
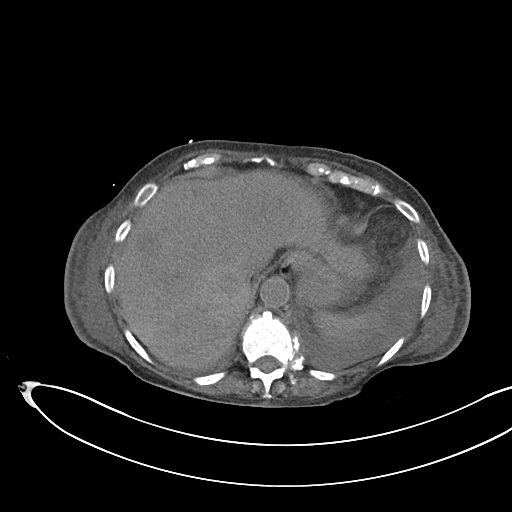
[im 83/94  soft-tissue]
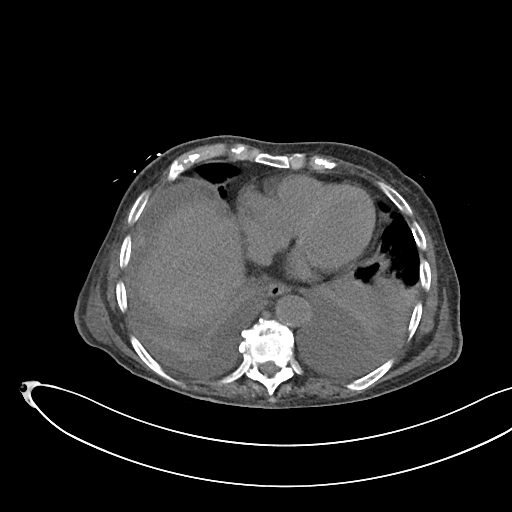
[im 88/94  soft-tissue]
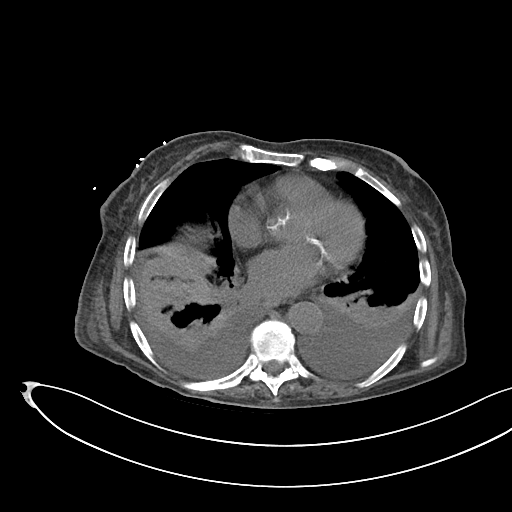

[Series 6: cor · coronal · 0.92mm/px · 3 of 77 slices shown]
[im 26/77  soft-tissue]
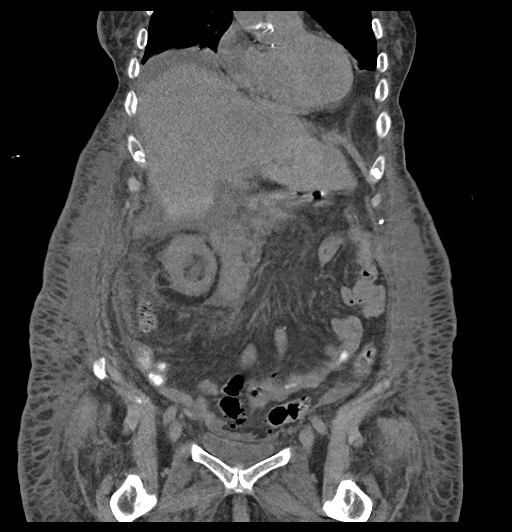
[im 34/77  soft-tissue]
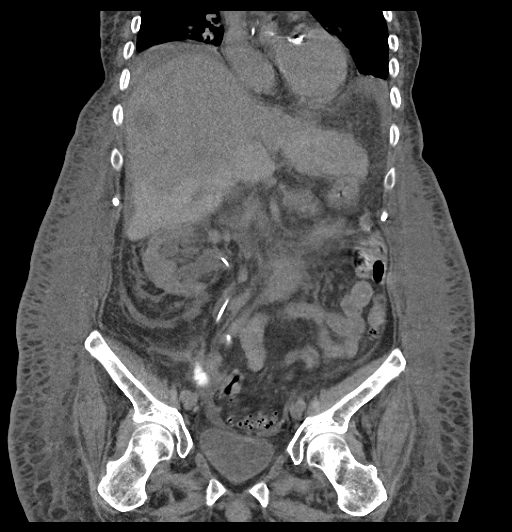
[im 43/77  soft-tissue]
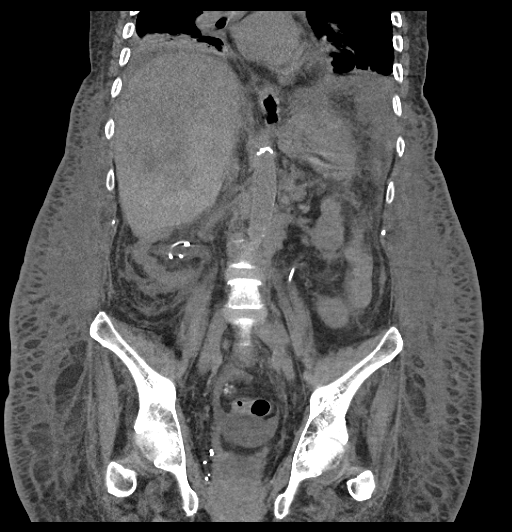

[16 of 46 positions shown; findings below may reference images not displayed]

FINDINGS: Lower chest: Large bilateral pleural effusions with compressive
atelectasis or pneumonia in the lower lobes. Heart is normal size.

Hepatobiliary: Numerous ill-defined masses throughout the liver.
There are morphologic changes of cirrhosis with nodular contours.
Small gallstones layering in the gallbladder.

Pancreas: No focal abnormality or ductal dilatation.

Spleen: No focal abnormality.  Normal size.

Adrenals/Urinary Tract: Bilateral ureteral stents are in place in
the within the bladder. Mild bilateral hydronephrosis, right greater
than left. Left kidney is atrophic.

Stomach/Bowel: Sigmoid diverticulosis. No active diverticulitis. No
evidence of bowel obstruction.

Vascular/Lymphatic: Diffuse aortic and iliac calcifications. No
aneurysm or adenopathy.

Reproductive: Prior hysterectomy.  No adnexal masses.

Other: Small amount of free fluid in the pelvis and around the liver
and spleen. No free air. Extensive edema throughout the subcutaneous
soft tissues.

Musculoskeletal: Extensive sclerotic lesions throughout the thoracic
and lumbar spine and sacrum compatible with sclerotic metastases.
IMPRESSION: Numerous lesions throughout the liver most suggestive of metastatic
disease. Morphologic features of the liver suggests cirrhosis.

Extensive bony metastatic disease in the spine and pelvis compatible
with sclerotic bone metastases.

Mild bilateral hydronephrosis, right greater than left. Left kidney
is atrophic.

Large bilateral pleural effusions with bilateral lower lobe
atelectasis or pneumonia.

Cholelithiasis.

Sigmoid diverticulosis.

## 2018-12-21 IMAGING — US IR NEPHROSTOMY PLACEMENT RIGHT
1 series · 1 of 1 positions shown · non-contrast
Comparison: None.

INDICATION: 85-year-old female with a history of urothelial cancer. She has
bilateral ureteral stents which were placed at an outside
institution. Unfortunately, she has recurrent right-sided
hydronephrosis and worsening renal function consistent with
obstructed uropathy. Placement of a percutaneous nephrostomy tube is
warranted for renal decompression.

EXAM:
IR NEPHROSTOMY PLACEMENT RIGHT

[Series 1: ir nephrostomy placement right · 1 of 1 slices shown]
[im 1/1]
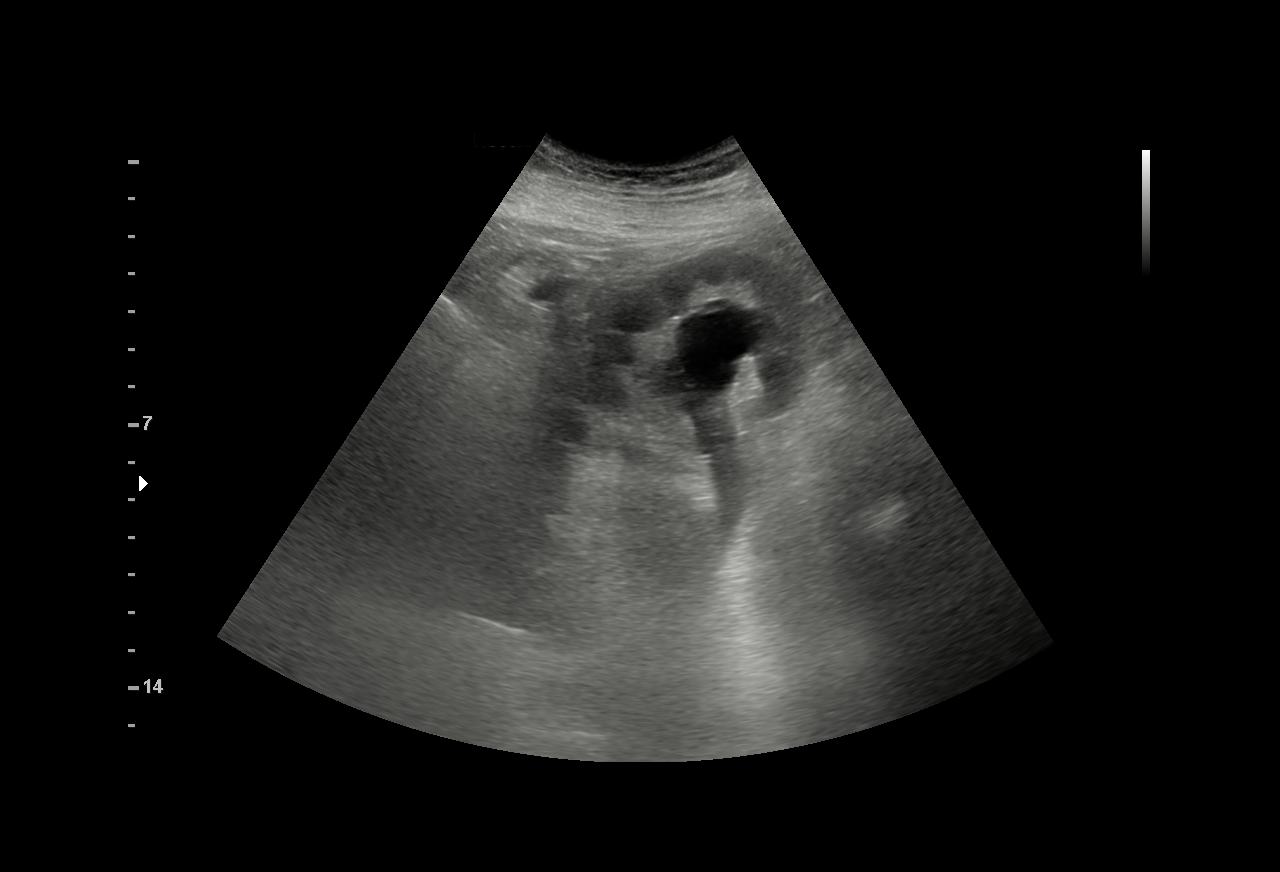

[1 of 1 positions shown; findings below may reference images not displayed]

MEDICATIONS:
Ciprofloxacin 400 mg IV; The antibiotic was administered in an
appropriate time frame prior to skin puncture.

ANESTHESIA/SEDATION:
Fentanyl 75 mcg IV; Versed 1.5 mg IV

Moderate Sedation Time:  15 minutes

The patient was continuously monitored during the procedure by the
interventional radiology nurse under my direct supervision.

CONTRAST:  15 mL 9sovue-555 - administered into the collecting
system(s)

FLUOROSCOPY TIME:  Fluoroscopy Time: 1 minutes 54 seconds (5 mGy).

COMPLICATIONS:
None immediate.

PROCEDURE:
Informed written consent was obtained from the patient after a
thorough discussion of the procedural risks, benefits and
alternatives. All questions were addressed. Maximal Sterile Barrier
Technique was utilized including caps, mask, sterile gowns, sterile
gloves, sterile drape, hand hygiene and skin antiseptic. A timeout
was performed prior to the initiation of the procedure.

The right flank was interrogated with ultrasound. The right kidney
is identified and is found to be hydronephrotic. A suitable calyx
was selected. Local anesthesia was attained by infiltration with 1%
lidocaine. A small dermatotomy was made. Under real-time sonographic
guidance, a posterior interpolar calyx was punctured with a 21 gauge
Accustick needle. There was return of clear urine. A hand injection
of contrast material through the needle opacifies the renal
collecting system. A 0.018 inch wire was advanced into the renal
collecting system. The needle was removed. The Accustick sheath was
advanced of the renal collecting system. The 0.018 inch wire was
then exchanged for a 0.035 inch wire. The skin tract was dilated to
10 French. Rahim Manas 10.2 French all-purpose drainage catheter was
advanced over the wire and formed in the renal pelvis. A gentle hand
injection of contrast material confirms placement within the renal
pelvis. A fluoroscopic spot image was saved.

The catheter was connected to gravity bag drainage and secured to
the skin with 0 Prolene suture. The patient tolerated the procedure
well.
IMPRESSION: Successful placement of a 10 French right-sided percutaneous
nephrostomy tube.

nephrostomy tube check and exchange.

## 2019-04-06 IMAGING — US US RENAL
1 series · 14 of 25 positions shown · non-contrast
Comparison: Abdominal CT from yesterday

CLINICAL DATA: Bilateral hydronephrosis

EXAM:
RENAL / URINARY TRACT ULTRASOUND COMPLETE

[Series 1: us renal · 0.23mm/px · 14 of 34 slices shown]
[im 1/34]
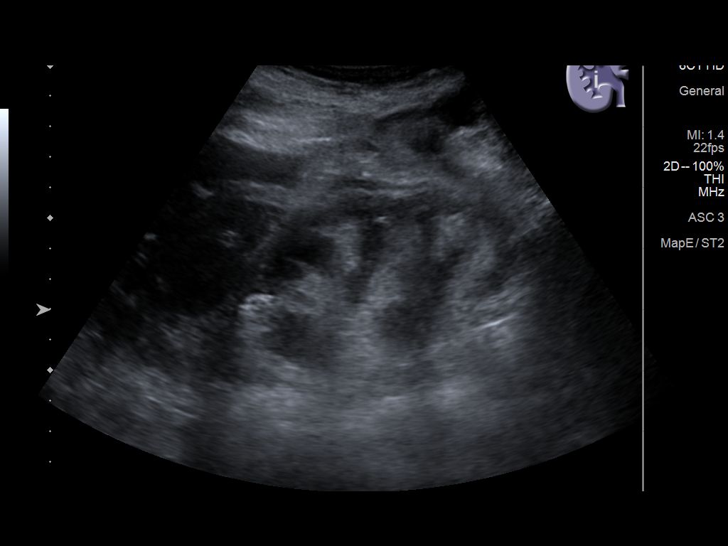
[im 3/34]
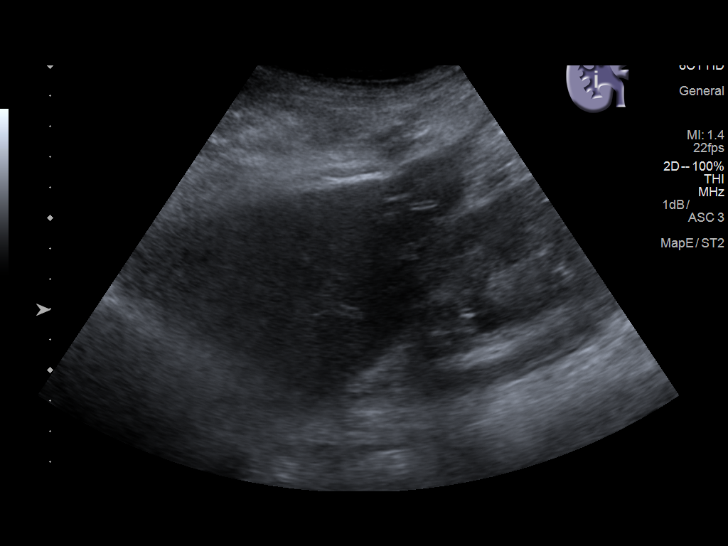
[im 6/34]
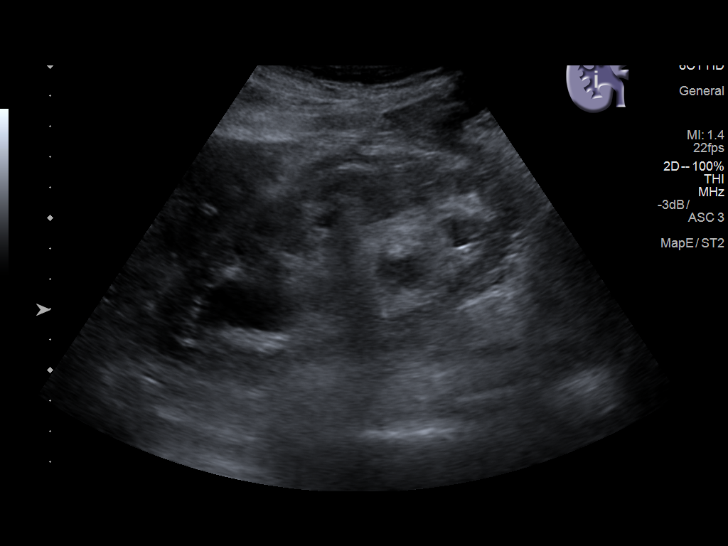
[im 9/34]
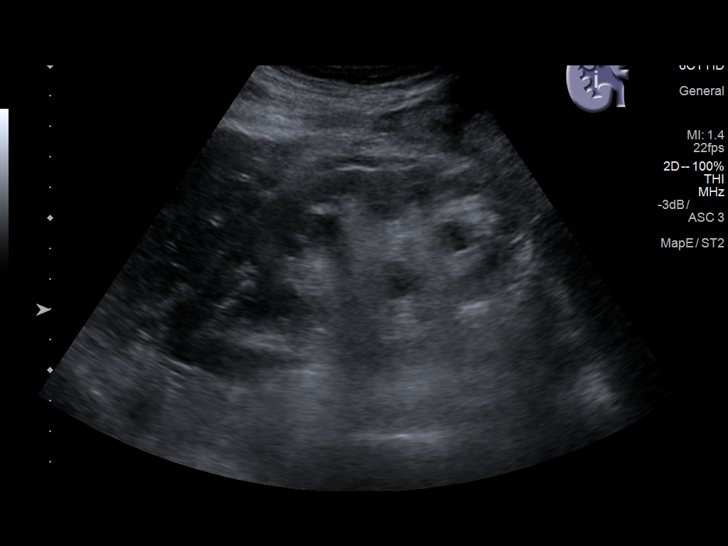
[im 12/34]
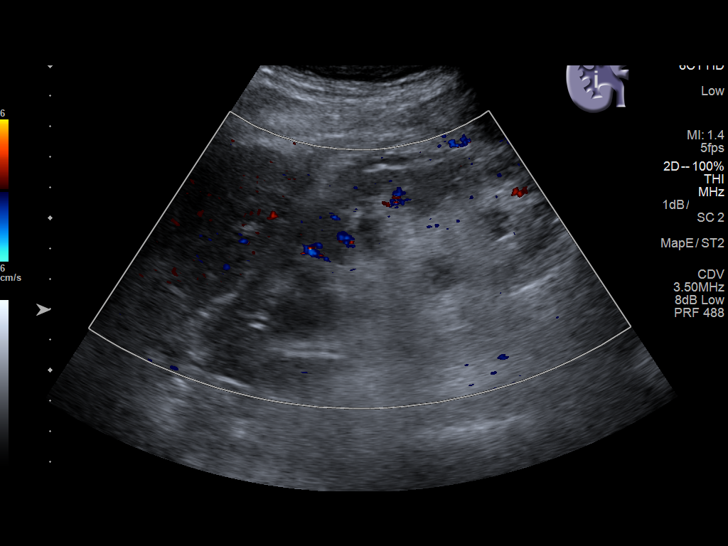
[im 13/34]
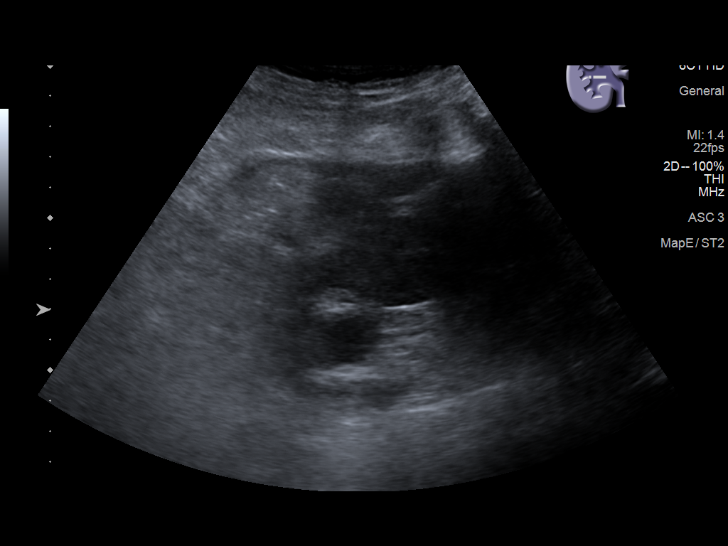
[im 16/34]
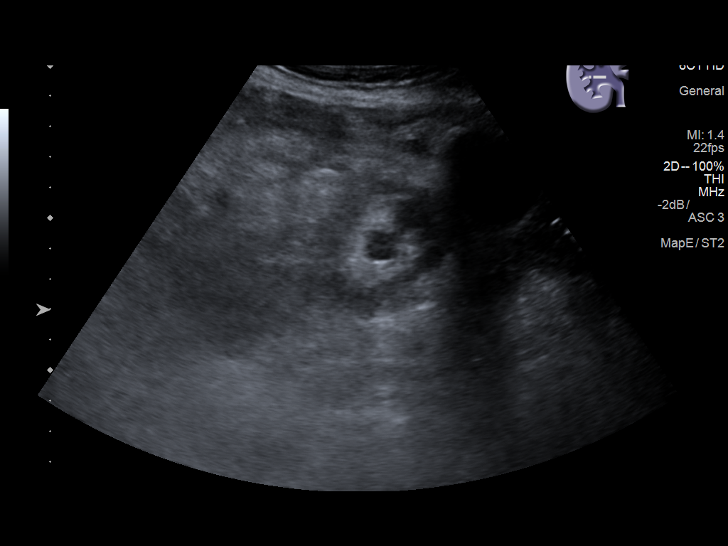
[im 18/34]
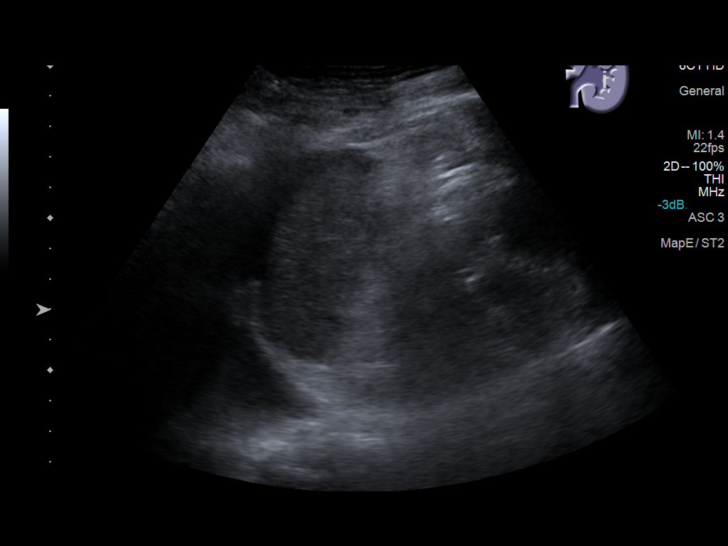
[im 21/34]
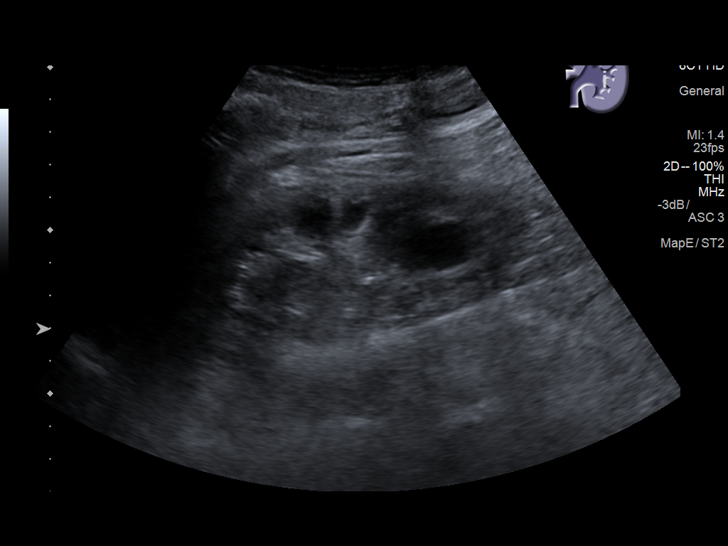
[im 23/34]
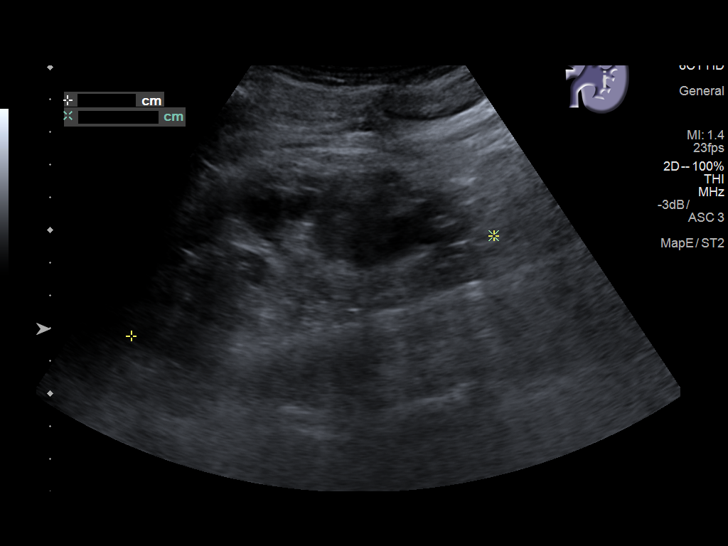
[im 25/34]
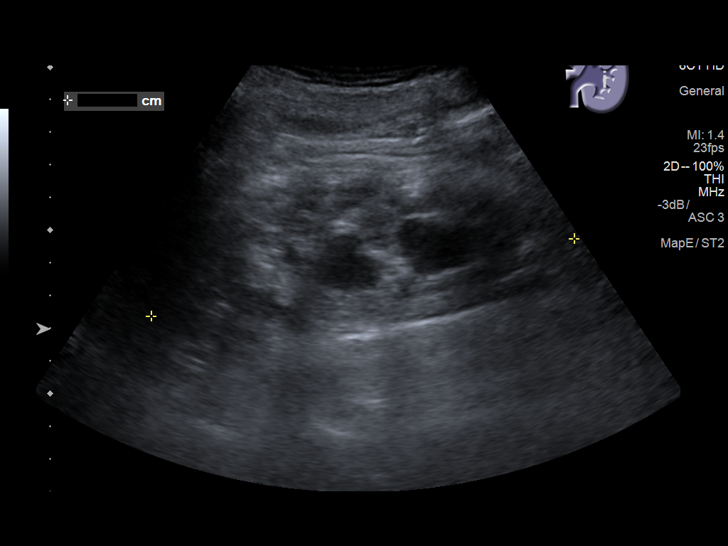
[im 28/34]
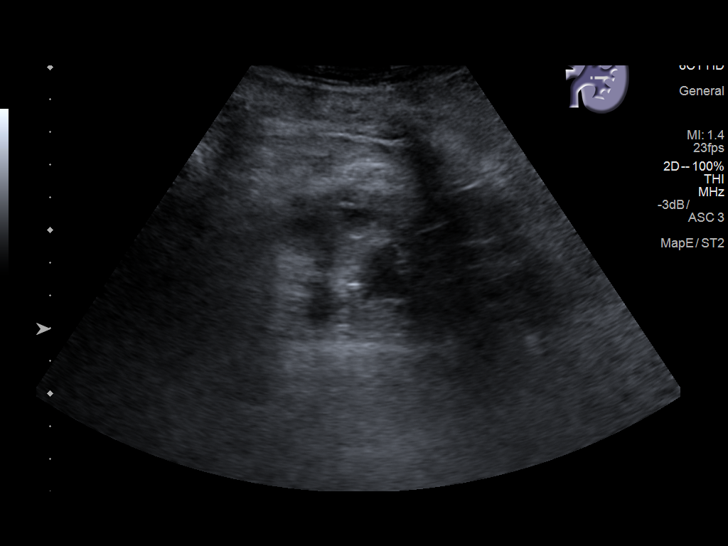
[im 31/34]
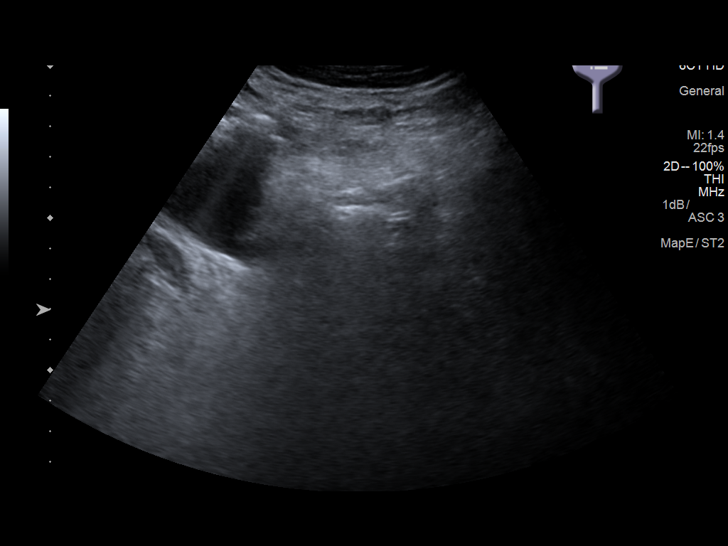
[im 34/34]
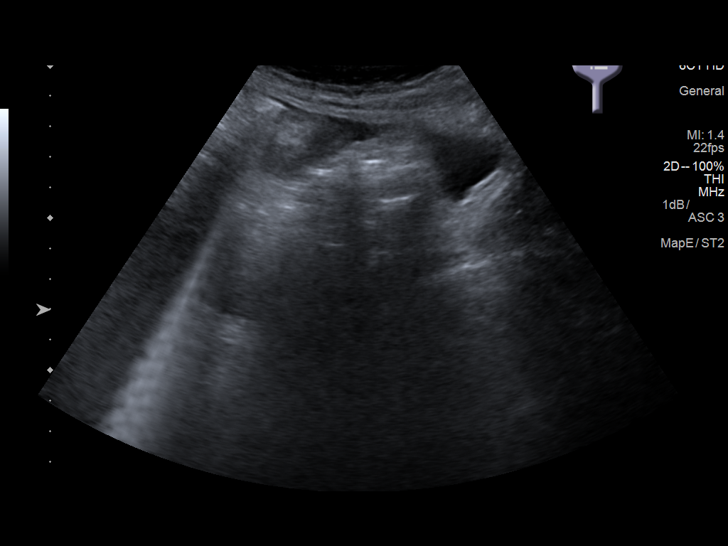

[14 of 25 positions shown; findings below may reference images not displayed]

FINDINGS: Right Kidney:

Length: 13 cm. Echogenic with moderate hydronephrosis. The known
ureteral stent is difficult to visualize. No mass lesion.

Left Kidney:

Length: 9 cm, which is overestimated relative to abdominal CT from
yesterday (8cm). Echogenic cortex with diffuse cortical thinning.
Mild hydronephrosis. Known ureteral stent is difficult to visualize.

Bladder:

Decompressed.

Trace ascites.  Known pleural effusions.
IMPRESSION: 1. Moderate right and mild left hydronephrosis that is stable from
CT yesterday.
2. Decompressed urinary bladder.
3. Chronic medical renal disease with asymmetric left renal atrophy.
4. Trace ascites.
